# Patient Record
Sex: Male | Born: 1950 | Race: White | Hispanic: No | Marital: Married | Smoking: Former smoker
Health system: Southern US, Community
[De-identification: ages and names within clinical notes are randomized; demographics above are authoritative.]

## PROBLEM LIST (undated history)

## (undated) DIAGNOSIS — K219 Gastro-esophageal reflux disease without esophagitis: Secondary | ICD-10-CM

## (undated) DIAGNOSIS — E119 Type 2 diabetes mellitus without complications: Secondary | ICD-10-CM

## (undated) DIAGNOSIS — M5430 Sciatica, unspecified side: Secondary | ICD-10-CM

## (undated) DIAGNOSIS — I2510 Atherosclerotic heart disease of native coronary artery without angina pectoris: Secondary | ICD-10-CM

## (undated) DIAGNOSIS — I10 Essential (primary) hypertension: Secondary | ICD-10-CM

## (undated) DIAGNOSIS — F419 Anxiety disorder, unspecified: Secondary | ICD-10-CM

## (undated) HISTORY — PX: CARDIAC CATHETERIZATION: SHX172

## (undated) HISTORY — DX: Atherosclerotic heart disease of native coronary artery without angina pectoris: I25.10

## (undated) HISTORY — PX: OTHER SURGICAL HISTORY: SHX169

## (undated) HISTORY — DX: Type 2 diabetes mellitus without complications: E11.9

## (undated) HISTORY — DX: Sciatica, unspecified side: M54.30

## (undated) HISTORY — DX: Essential (primary) hypertension: I10

## (undated) HISTORY — DX: Anxiety disorder, unspecified: F41.9

---

## 2010-05-17 DIAGNOSIS — I219 Acute myocardial infarction, unspecified: Secondary | ICD-10-CM

## 2010-05-17 HISTORY — DX: Acute myocardial infarction, unspecified: I21.9

## 2010-05-17 HISTORY — PX: CORONARY ANGIOPLASTY WITH STENT PLACEMENT: SHX49

## 2010-05-17 HISTORY — PX: STENT PLACEMENT VASCULAR (ARMC HX): HXRAD1737

## 2014-08-22 DIAGNOSIS — E785 Hyperlipidemia, unspecified: Secondary | ICD-10-CM | POA: Insufficient documentation

## 2014-08-22 DIAGNOSIS — I10 Essential (primary) hypertension: Secondary | ICD-10-CM | POA: Insufficient documentation

## 2014-09-11 DIAGNOSIS — E1142 Type 2 diabetes mellitus with diabetic polyneuropathy: Secondary | ICD-10-CM | POA: Insufficient documentation

## 2019-08-13 ENCOUNTER — Ambulatory Visit: Payer: Self-pay | Attending: Internal Medicine

## 2019-08-13 ENCOUNTER — Other Ambulatory Visit: Payer: Self-pay

## 2019-08-13 DIAGNOSIS — Z23 Encounter for immunization: Secondary | ICD-10-CM

## 2019-08-13 NOTE — Progress Notes (Signed)
   Covid-19 Vaccination Clinic  Name:  Ronald Roth    MRN: [id number] DOB: 1951/04/26  08/13/2019  [name] was observed post Covid-19 immunization for 15 minutes without incident. He was provided with Vaccine Information Sheet and instruction to access the V-Safe system.   [name] was instructed to call 911 with any severe reactions post vaccine: Marland Kitchen Difficulty breathing  . Swelling of face and throat  . A fast heartbeat  . A bad rash all over body  . Dizziness and weakness   Immunizations Administered    Name Date Dose VIS Date Route   Pfizer COVID-19 Vaccine 08/13/2019 11:42 AM 0.3 mL 04/27/2019 Intramuscular   Manufacturer: [employer], [employer]   Lot: [id number]   NDC: [address]-0

## 2019-09-05 ENCOUNTER — Ambulatory Visit: Payer: Self-pay | Attending: Internal Medicine

## 2019-09-05 DIAGNOSIS — Z23 Encounter for immunization: Secondary | ICD-10-CM

## 2019-09-05 NOTE — Progress Notes (Signed)
   Covid-19 Vaccination Clinic  Name:  [name]    MRN: [id number] DOB: 02/22/1951  09/05/2019  [name] was observed post Covid-19 immunization for 15 minutes without incident. He was provided with Vaccine Information Sheet and instruction to access the V-Safe system.   [name] was instructed to call 911 with any severe reactions post vaccine: Marland Kitchen Difficulty breathing  . Swelling of face and throat  . A fast heartbeat  . A bad rash all over body  . Dizziness and weakness   Immunizations Administered    Name Date Dose VIS Date Route   Pfizer COVID-19 Vaccine 09/05/2019  3:22 PM 0.3 mL 07/11/2018 Intramuscular   Manufacturer: [employer], [employer]   Lot: [id number]   NDC: [address]-1

## 2020-01-29 LAB — HEMOGLOBIN A1C: Hemoglobin A1C: 7.3

## 2020-08-22 ENCOUNTER — Ambulatory Visit (INDEPENDENT_AMBULATORY_CARE_PROVIDER_SITE_OTHER): Payer: Managed Care, Other (non HMO) | Admitting: Family Medicine

## 2020-08-22 ENCOUNTER — Other Ambulatory Visit: Payer: Self-pay

## 2020-08-22 ENCOUNTER — Encounter: Payer: Self-pay | Admitting: Family Medicine

## 2020-08-22 VITALS — BP 155/89 | HR 95 | Ht 70.5 in | Wt 206.0 lb

## 2020-08-22 DIAGNOSIS — R0600 Dyspnea, unspecified: Secondary | ICD-10-CM

## 2020-08-22 DIAGNOSIS — I2510 Atherosclerotic heart disease of native coronary artery without angina pectoris: Secondary | ICD-10-CM | POA: Diagnosis not present

## 2020-08-22 DIAGNOSIS — E1169 Type 2 diabetes mellitus with other specified complication: Secondary | ICD-10-CM

## 2020-08-22 DIAGNOSIS — E785 Hyperlipidemia, unspecified: Secondary | ICD-10-CM

## 2020-08-22 DIAGNOSIS — E1140 Type 2 diabetes mellitus with diabetic neuropathy, unspecified: Secondary | ICD-10-CM | POA: Diagnosis not present

## 2020-08-22 DIAGNOSIS — R0609 Other forms of dyspnea: Secondary | ICD-10-CM

## 2020-08-22 DIAGNOSIS — Z7689 Persons encountering health services in other specified circumstances: Secondary | ICD-10-CM | POA: Diagnosis not present

## 2020-08-22 MED ORDER — OZEMPIC (0.25 OR 0.5 MG/DOSE) 2 MG/1.5ML ~~LOC~~ SOPN: 0.2500 mg | PEN_INJECTOR | SUBCUTANEOUS | 0 refills | Status: DC

## 2020-08-22 MED ORDER — JARDIANCE 25 MG PO TABS: 25.0000 mg | ORAL_TABLET | Freq: Every day | ORAL | 1 refills | Status: DC

## 2020-08-22 NOTE — Patient Instructions (Addendum)
Thank you for coming to the office today.  [facility]) HeartCare at [facility] [address] [place], [place] [address] Main: [phone]   Stop Synjardy  Take new Jardiance 25mg  once daily, sent to [facility]  Stop Bydureon Start new sample Ozempic  0.25mg  weekly for 4 weeks then up to 0.5mg  weekly x 2 weeks, will need new order, let me know before run out we can order if it is covered.  Please schedule a Follow-up Appointment to: Return in about 3 months (around 11/21/2020) for 3 month follow-up DM A1c, cardiology updates, HTN.  If you have any other questions or concerns, please feel free to call the office or send a message through MyChart. You may also schedule an earlier appointment if necessary.  Additionally, you may be receiving a survey about your experience at our office within a few days to 1 week by e-mail or mail. We value your feedback.  01/22/2021, DO [facility], [facility]

## 2020-08-22 NOTE — Progress Notes (Signed)
Subjective:    Patient ID: Ronald Roth, male    DOB: 03-14-1951, 70 y.o.   MRN: [id number]  Ronald Roth is a 70 y.o. male presenting on 08/22/2020 for Establish Care, Diabetes  Previously moved from [place] to [place], he had health issues in [place], had history of heart attack. Back in [place] he had Endocrinology managing most of his health. Now moved to [place] about 1 year ago. Here for new PCP. His wife works at [employer].  HPI   CHRONIC DM, Type 2: Hyperlipidemia History of DM since 2013, previously managed by Endocrinology Last medical visit telemedicine with Endocrinology, 01/2020 Meds: Bydureon BCIse 2mg  weekly, Synjardy 12.5mg /1000mg  BID w/ meal He reports issues with side effect on medication due to fecal urgency and diarrhea. Currently on ACEi  Denies hypoglycemia, polyuria, visual changes, numbness or tingling.  CAD Prior MI in 2013, angioplasty, stent. He is on Metoprolol 25mg  BID, ASA 81mg  daily, Lisinopril 10mg  daily. He describes an exertional dyspnea problem.  Recent URI Cough congestion recently within past 1-2 weeks. Had negative COVID test within past week from [facility]   Depression screen [facility] 2/9 08/22/2020  Decreased Interest 0  Down, Depressed, Hopeless 0  PHQ - 2 Score 0    Past Medical History:  Diagnosis Date  . Anxiety   . Diabetes mellitus without complication (HCC)   . Heart attack (HCC) 2012  . Hypertension   . Sciatica    Past Surgical History:  Procedure Laterality Date  . Ruptured disc     Sciatica   . STENT PLACEMENT VASCULAR ([facility] HX)  2012   Social History   Socioeconomic History  . Marital status: Married    Spouse name: Not on file  . Number of children: Not on file  . Years of education: Not on file  . Highest education level: Not on file  Occupational History  . Not on file  Tobacco Use  . Smoking status: Former Smoker    Years: 30.00    Types: Cigarettes  . Smokeless tobacco: Never Used  Substance and Sexual  Activity  . Alcohol use: Not Currently    Comment: Occasionally   . Drug use: Not on file  . Sexual activity: Not on file  Other Topics Concern  . Not on file  Social History Narrative  . Not on file   Social Determinants of Health   Financial Resource Strain: Not on file  Food Insecurity: Not on file  Transportation Needs: Not on file  Physical Activity: Not on file  Stress: Not on file  Social Connections: Not on file  Intimate Partner Violence: Not on file   History reviewed. No pertinent family history. Current Outpatient Medications on File Prior to Visit  Medication Sig  . aspirin EC 81 MG tablet Take 81 mg by mouth daily. Swallow whole.  4/9 atorvastatin (LIPITOR) 80 MG tablet Take 80 mg by mouth daily.  . cholecalciferol (VITAMIN D3) 25 MCG (1000 UNIT) tablet Take 1,000 Units by mouth daily.  . fenofibrate (TRICOR) 145 MG tablet Take 145 mg by mouth daily.  10/22/2020 lisinopril (ZESTRIL) 20 MG tablet Take 20 mg by mouth daily.  . metoprolol tartrate (LOPRESSOR) 25 MG tablet Take 25 mg by mouth daily.  2013 omeprazole (PRILOSEC) 20 MG capsule Take 20 mg by mouth daily.  . Vitamin D, Ergocalciferol, (DRISDOL) 1.25 MG (50000 UNIT) CAPS capsule Take by mouth.   No current facility-administered medications on file prior to visit.    Review of Systems  Per HPI unless specifically indicated above      Objective:    BP (!) 155/89   Pulse 95   Ht 5' 10.5" (1.791 m)   Wt 206 lb (93.4 kg)   SpO2 98%   BMI 29.14 kg/m   Wt Readings from Last 3 Encounters:  08/22/20 206 lb (93.4 kg)    Physical Exam Vitals and nursing note reviewed.  Constitutional:      General: He is not in acute distress.    Appearance: He is well-developed. He is not diaphoretic.     Comments: Well-appearing, comfortable, cooperative  HENT:     Head: Normocephalic and atraumatic.  Eyes:     General:        Right eye: No discharge.        Left eye: No discharge.     Conjunctiva/sclera: Conjunctivae  normal.  Neck:     Thyroid: No thyromegaly.  Cardiovascular:     Rate and Rhythm: Normal rate and regular rhythm.     Heart sounds: Normal heart sounds. No murmur heard.   Pulmonary:     Effort: Pulmonary effort is normal. No respiratory distress.     Breath sounds: Normal breath sounds. No wheezing or rales.  Musculoskeletal:        General: Normal range of motion.     Cervical back: Normal range of motion and neck supple.  Lymphadenopathy:     Cervical: No cervical adenopathy.  Skin:    General: Skin is warm and dry.     Findings: No erythema or rash.  Neurological:     Mental Status: He is alert and oriented to person, place, and time.  Psychiatric:        Behavior: Behavior normal.     Comments: Well groomed, good eye contact, normal speech and thoughts        Results for orders placed or performed in visit on 08/22/20  Hemoglobin A1c  Result Value Ref Range   Hemoglobin A1C 7.3       Assessment & Plan:   Problem List Items Addressed This Visit    Type 2 diabetes, controlled, with neuropathy (HCC) - Primary   Relevant Medications   atorvastatin (LIPITOR) 80 MG tablet   aspirin EC 81 MG tablet   lisinopril (ZESTRIL) 20 MG tablet   OZEMPIC, 0.25 OR 0.5 MG/DOSE, 2 MG/1.5ML SOPN   JARDIANCE 25 MG TABS tablet   Other Relevant Orders   Ambulatory referral to Cardiology   Hyperlipidemia associated with type 2 diabetes mellitus (HCC)   Relevant Medications   atorvastatin (LIPITOR) 80 MG tablet   aspirin EC 81 MG tablet   fenofibrate (TRICOR) 145 MG tablet   lisinopril (ZESTRIL) 20 MG tablet   OZEMPIC, 0.25 OR 0.5 MG/DOSE, 2 MG/1.5ML SOPN   JARDIANCE 25 MG TABS tablet   Other Relevant Orders   Ambulatory referral to Cardiology   Coronary artery disease involving native coronary artery of native heart without angina pectoris   Relevant Medications   atorvastatin (LIPITOR) 80 MG tablet   aspirin EC 81 MG tablet   metoprolol tartrate (LOPRESSOR) 25 MG tablet    fenofibrate (TRICOR) 145 MG tablet   lisinopril (ZESTRIL) 20 MG tablet   Other Relevant Orders   Ambulatory referral to Cardiology    Other Visit Diagnoses    Encounter to establish care with new doctor       Dyspnea on exertion       Relevant Orders   Ambulatory referral to  Cardiology      #Dyspnea CAD s/p stent HTN HLD Referral to Cardiology [facility] history of CAD, prior MI w/ stent in 2013 [place], dyspnea on exertion worsening now - pursue further work up.  Previously controlled DM, last A1c 7 range 01/2020 Complications - peripheral neuropathy,CAD, Hyperlipidemia  Plan:  1. Change therapy due to GI intolerance on Metformin. 2. Stop Bydureon BCise. Start Ozempic sample. 0.25mg  weekly x 4 weeks then up to 0.5mg  weekly x 2 weeks, for better efficacy in future at higher doses. 3. Stop Synjardy. Re order Jardiance 25mg   Encourage improved lifestyle - low carb, low sugar diet, reduce portion size, continue improving regular exercise Check CBG bring log to next visit for review Continue ASA, ACEi  Statin Advised to schedule DM ophtho exam, send record   Meds ordered this encounter  Medications  . OZEMPIC, 0.25 OR 0.5 MG/DOSE, 2 MG/1.5ML SOPN    Sig: Inject 0.25 mg into the skin once a week. For first 4 weeks. Then increase dose to 0.5mg  weekly    Dispense:  1 each    Refill:  0  . JARDIANCE 25 MG TABS tablet    Sig: Take 1 tablet (25 mg total) by mouth daily before breakfast.    Dispense:  90 tablet    Refill:  1    Discontinue Synjardy, switch to Ezel.     Follow up plan: Return in about 3 months (around 11/21/2020) for 3 month follow-up DM A1c, cardiology updates, HTN.  Will have [facility] orders drawn prior to next visit instead (Note wife works at 01/22/2021 and this is preferred)  [employer], DO [facility] 08/22/2020, 10:32 AM

## 2020-09-11 DIAGNOSIS — E1140 Type 2 diabetes mellitus with diabetic neuropathy, unspecified: Secondary | ICD-10-CM

## 2020-09-11 MED ORDER — OZEMPIC (0.25 OR 0.5 MG/DOSE) 2 MG/1.5ML ~~LOC~~ SOPN: 0.5000 mg | PEN_INJECTOR | SUBCUTANEOUS | 1 refills | Status: DC

## 2020-09-15 NOTE — Progress Notes (Signed)
Cardiology Office Note  Date:  09/16/2020   ID:  Ronald Roth, DOB 1951/02/17, MRN [id number]  PCP:  Smitty Cords, DO   Chief Complaint  Patient presents with  . New Patient (Initial Visit)    Ref by Dr. Althea Charon to establish care for CAD/s/p stent placement in 2012 in [place], [place]. Medications reviewed by the patient verbally. Patient c/o shortness of breath and chest pain.     HPI:  Ronald Roth is a 70 year old gentleman with past medical history of Diabetes type 2 Coronary disease prior MI 2012 angioplasty, stent Hyperlipidemia  former smoker Who presents by referral from Dr. Althea Charon for consultation of his coronary disease  Prior cardiac history discussed with him, records requested Typically feels fine but more recently reports that he Feels like I have run 1000 miles Goes to [employer] soccer games: trouble with walk to stadium Incline or anxiety worse  Does not check blood pressure at home, running high on today's visit  Prior anginal sx:  In 2012: left side chest pain, angina  Lab work reviewed from September 2021 Creatinine 1.23 Total cholesterol 164 LDL 102 A1c 7.3  [facility],  In [place], [place]  Father: aneurysm, CVA  Lived in [place] 6 years in [place], cutting grass would get some SOB  Some SOB during sex, coughing Weight down in 10 years  EKG personally reviewed by myself on todays visit NSR rate 97 LBBB   PMH:   has a past medical history of Anxiety, Coronary artery disease, Diabetes mellitus without complication (HCC), Heart attack (HCC) (2012), Hypertension, and Sciatica.  PSH:    Past Surgical History:  Procedure Laterality Date  . CARDIAC CATHETERIZATION    . CORONARY ANGIOPLASTY WITH STENT PLACEMENT  2012   x 1  . Ruptured disc     Sciatica   . STENT PLACEMENT VASCULAR ([facility] HX)  2012    Current Outpatient Medications  Medication Sig Dispense Refill  . aspirin EC 81 MG tablet Take 81 mg by mouth  daily. Swallow whole.    Marland Kitchen atorvastatin (LIPITOR) 80 MG tablet Take 80 mg by mouth daily.    . cholecalciferol (VITAMIN D3) 25 MCG (1000 UNIT) tablet Take 1,000 Units by mouth daily.    Marland Kitchen ezetimibe (ZETIA) 10 MG tablet Take 1 tablet (10 mg total) by mouth daily. 90 tablet 3  . fenofibrate (TRICOR) 145 MG tablet Take 145 mg by mouth daily.    Marland Kitchen JARDIANCE 25 MG TABS tablet Take 1 tablet (25 mg total) by mouth daily before breakfast. 90 tablet 1  . omeprazole (PRILOSEC) 20 MG capsule Take 20 mg by mouth daily.    Marland Kitchen OZEMPIC, 0.25 OR 0.5 MG/DOSE, 2 MG/1.5ML SOPN Inject 0.5 mg into the skin once a week. 4.5 mL 1  . tadalafil (CIALIS) 20 MG tablet Take 1 tablet (20 mg total) by mouth daily as needed for erectile dysfunction. 10 tablet 6  . Vitamin D, Ergocalciferol, (DRISDOL) 1.25 MG (50000 UNIT) CAPS capsule Take by mouth.     No current facility-administered medications for this visit.   Also taking metoprolol tartrate 25 daily, not twice a day Taking lisinopril 20 daily  Allergies:   Patient has no known allergies.   Social History:  The patient  reports that he quit smoking about 29 years ago. His smoking use included cigarettes. He has a 30.00 pack-year smoking history. He has never used smokeless tobacco. He reports previous alcohol use. He reports that he does not use drugs.  Family History:   family history includes Aortic aneurysm in his father; Arrhythmia in his mother; Hyperlipidemia in his sister; Hypertension in his sister; Pneumonia in his mother.    Review of Systems: Review of Systems  Constitutional: Negative.   HENT: Negative.   Respiratory: Negative.   Cardiovascular: Negative.   Gastrointestinal: Negative.   Musculoskeletal: Negative.   Neurological: Negative.   Psychiatric/Behavioral: Negative.   All other systems reviewed and are negative.    PHYSICAL EXAM: VS:  BP (!) 160/86 (BP Location: Right Arm, Patient Position: Sitting, Cuff Size: Normal)   Pulse 97   Ht  5' 10.5" (1.791 m)   Wt 207 lb 8 oz (94.1 kg)   SpO2 98%   BMI 29.35 kg/m  , BMI Body mass index is 29.35 kg/m. GEN: Well nourished, well developed, in no acute distress HEENT: normal Neck: no JVD, carotid bruits, or masses Cardiac: RRR; no murmurs, rubs, or gallops,no edema  Respiratory:  clear to auscultation bilaterally, normal work of breathing GI: soft, nontender, nondistended, + BS MS: no deformity or atrophy Skin: warm and dry, no rash Neuro:  Strength and sensation are intact Psych: euthymic mood, full affect   Recent Labs: No results found for requested labs within last 8760 hours.    Lipid Panel No results found for: CHOL, HDL, LDLCALC, TRIG    Wt Readings from Last 3 Encounters:  09/16/20 207 lb 8 oz (94.1 kg)  08/22/20 206 lb (93.4 kg)       ASSESSMENT AND PLAN:  Problem List Items Addressed This Visit      Cardiology Problems   Coronary artery disease involving native coronary artery of native heart without angina pectoris - Primary   Relevant Medications   ezetimibe (ZETIA) 10 MG tablet   tadalafil (CIALIS) 20 MG tablet   lisinopril (ZESTRIL) 20 MG tablet   metoprolol succinate (TOPROL-XL) 50 MG 24 hr tablet   Other Relevant Orders   EKG 12-Lead     Other   Type 2 diabetes, controlled, with neuropathy (HCC)   Relevant Medications   lisinopril (ZESTRIL) 20 MG tablet   Other Relevant Orders   EKG 12-Lead   Hyperlipidemia associated with type 2 diabetes mellitus (HCC)   Relevant Medications   ezetimibe (ZETIA) 10 MG tablet   lisinopril (ZESTRIL) 20 MG tablet    Other Visit Diagnoses    Chest pain, unspecified type       Relevant Orders   ECHOCARDIOGRAM COMPLETE   NM Myocar Multi W/Spect W/Wall Motion / EF   Short of breath on exertion       Relevant Orders   ECHOCARDIOGRAM COMPLETE     Essential hypertension Recommend monitoring blood pressure at home Lisinopril increased up to 40 daily, metoprolol tartrate 25 that he is taking now will  change to metoprolol succinate 50 daily  Shortness of breath Unable to exclude ischemia Echocardiogram ordered, Stress test also ordered Stressed importance of getting his blood pressure down  Hyperlipidemia Not at goal, will add Zetia to his Lipitor goal LDL less than 70  Coronary disease with stable angina We have ordered stress test as above Cholesterol medication modification as above Echo pending Records requested   Total encounter time more than 60 minutes  Greater than 50% was spent in counseling and coordination of care with the patient    Signed, Dossie Arbour, M.D., Ph.D. [facility] [place], [place] [phone]

## 2020-09-16 ENCOUNTER — Other Ambulatory Visit: Payer: Self-pay

## 2020-09-16 ENCOUNTER — Encounter: Payer: Self-pay | Admitting: Cardiovascular Disease

## 2020-09-16 ENCOUNTER — Ambulatory Visit (INDEPENDENT_AMBULATORY_CARE_PROVIDER_SITE_OTHER): Payer: Managed Care, Other (non HMO) | Admitting: Cardiovascular Disease

## 2020-09-16 ENCOUNTER — Telehealth: Payer: Self-pay

## 2020-09-16 VITALS — BP 160/86 | HR 97 | Ht 70.5 in | Wt 207.5 lb

## 2020-09-16 DIAGNOSIS — E1140 Type 2 diabetes mellitus with diabetic neuropathy, unspecified: Secondary | ICD-10-CM | POA: Diagnosis not present

## 2020-09-16 DIAGNOSIS — I2510 Atherosclerotic heart disease of native coronary artery without angina pectoris: Secondary | ICD-10-CM

## 2020-09-16 DIAGNOSIS — R0602 Shortness of breath: Secondary | ICD-10-CM

## 2020-09-16 DIAGNOSIS — E1169 Type 2 diabetes mellitus with other specified complication: Secondary | ICD-10-CM

## 2020-09-16 DIAGNOSIS — R079 Chest pain, unspecified: Secondary | ICD-10-CM

## 2020-09-16 DIAGNOSIS — E785 Hyperlipidemia, unspecified: Secondary | ICD-10-CM

## 2020-09-16 MED ORDER — LISINOPRIL 20 MG PO TABS: 40.0000 mg | ORAL_TABLET | Freq: Every day | ORAL | 3 refills | Status: DC

## 2020-09-16 MED ORDER — EZETIMIBE 10 MG PO TABS: 10.0000 mg | ORAL_TABLET | Freq: Every day | ORAL | 3 refills | Status: DC

## 2020-09-16 MED ORDER — TADALAFIL 20 MG PO TABS: 20.0000 mg | ORAL_TABLET | Freq: Every day | ORAL | 6 refills | Status: DC | PRN

## 2020-09-16 MED ORDER — METOPROLOL SUCCINATE ER 50 MG PO TB24: 50.0000 mg | ORAL_TABLET | Freq: Every day | ORAL | 3 refills | Status: DC

## 2020-09-16 NOTE — Patient Instructions (Addendum)
Medication Instructions:   Lisinopril was increased to 40 mg daily  START  Zetia 10 mg daily  metoprolol succinate 50 mg daily  Taldalifil /cialis 20 mg as needed  Can use GoodRx coupon  [employer] and [facility] cheaper   STOP metoprolol tartrate  If you need a refill on your cardiac medications before your next appointment, please call your pharmacy.    Lab work: No new labs needed  Testing/Procedures: ECHO  (shortness of breath, chest pain)  Your physician has requested that you have an echocardiogram. Echocardiography is a painless test that uses sound waves to create images of your heart. It provides your doctor with information about the size and shape of your heart and how well your heart's chambers and valves are working. This procedure takes approximately one hour. There are no restrictions for this procedure.  There is a possibility that an IV may need to be started during your test to inject an image enhancing agent. This is done to obtain more optimal pictures of your heart. Therefore we ask that you do at least drink some water prior to coming in to hydrate your veins.   [facility] MYOVIEW (Nuclear stress test)  Your caregiver has ordered a Stress Test with nuclear imaging. The purpose of this test is to evaluate the blood supply to your heart muscle. This procedure is referred to as a "Non-Invasive Stress Test." This is because other than having an IV started in your vein, nothing is inserted or "invades" your body. Cardiac stress tests are done to find areas of poor blood flow to the heart by determining the extent of coronary artery disease (CAD). Some patients exercise on a treadmill, which naturally increases the blood flow to your heart, while others who are  unable to walk on a treadmill due to physical limitations have a pharmacologic/chemical stress agent called Lexiscan . This medicine will mimic walking on a treadmill by temporarily increasing your coronary blood flow.    Please note: these test may take anywhere between 2-4 hours to complete  PLEASE REPORT TO [facility]  THE VOLUNTEERS AT THE FIRST DESK WILL DIRECT YOU WHERE TO GO  Instructions regarding medication:   _X__ : Hold diabetes medication morning of procedure  Jardiance (may take after stress test)  Ozempic (may take after stress test, if taken at night, only give  half dose)  _X___:  Hold betablocker(s) night before procedure and morning of procedure  Metoprolol (may take after stress test)   PLEASE NOTIFY THE OFFICE AT LEAST 24 HOURS IN ADVANCE IF YOU ARE UNABLE TO KEEP YOUR APPOINTMENT.  [phone] AND  PLEASE NOTIFY NUCLEAR MEDICINE AT [facility] AT LEAST 24 HOURS IN ADVANCE IF YOU ARE UNABLE TO KEEP YOUR APPOINTMENT. [phone]  How to prepare for your Myoview test:  1. Do not eat or drink after midnight 2. No caffeine for 24 hours prior to test 3. No smoking 24 hours prior to test. 4. Your medication may be taken with water.  If your doctor stopped a medication because of this test, do not take that medication. 5. Please wear a short sleeve shirt. 6. No perfume, cologne or lotion. 7. Wear comfortable walking shoes.   Follow-Up: At [facility], you and your health needs are our priority.  As part of our continuing mission to provide you with exceptional heart care, we have created designated Provider Care Teams.  These Care Teams include your primary Cardiologist (physician) and Advanced Practice Providers (APPs -  Physician  Assistants and Nurse Practitioners) who all work together to provide you with the care you need, when you need it.  . You will need a follow up appointment in 3 months  . Providers on your designated Care Team:   . Nicolasa Ducking, NP . Eula Listen, PA-C . Marisue Ivan, PA-C  Please monitor blood pressures and keep a log of your readings.  Call the clinic in 2 weeks with BP readings or upload them to Mychart  How to use a home  blood pressure monitor. . Be still. . Measure at the same time every day. It's important to take the readings at the same time each day, such as morning and evening. Take reading approximately 1 1/2 to 2 hours after BP medications.   AVOID these things for 30 minutes before checking your blood pressure:  Drinking caffeine.  Drinking alcohol.  Eating.  Smoking.  Exercising.   Five minutes before checking your blood pressure:  Pee.  Sit in a dining chair. Avoid sitting in a soft couch or armchair.  Be quiet. Do not talk.       Sit correctly. Sit with your back straight and supported (on a dining chair, rather than a sofa). Your feet should be flat on the floor and your legs should not be crossed. Your arm should be supported on a flat surface (such as a table) with the upper arm at heart level. Make sure the bottom of the cuff is placed directly above the bend of the elbow.    COVID-19 Vaccine Information can be found at: [employer]nl For questions related to vaccine distribution or appointments, please email vaccine@[email or web address] .[email or web address] or call 3347107405.

## 2020-09-16 NOTE — Telephone Encounter (Signed)
Medical records request form signed by Mr. Murray was faxed to [facility] - Dr. Madelyn Flavors, cardiologist in [place], [place]  Will wait for retun fax of pt's cardiac hx when he lived in [place].

## 2020-09-25 ENCOUNTER — Ambulatory Visit
Admission: RE | Admit: 2020-09-25 | Discharge: 2020-09-25 | Disposition: A | Discharge status: Home / Self Care | Payer: Managed Care, Other (non HMO) | Source: Ambulatory Visit | Attending: Cardiovascular Disease | Admitting: Cardiovascular Disease

## 2020-09-25 ENCOUNTER — Other Ambulatory Visit: Payer: Self-pay

## 2020-09-25 DIAGNOSIS — R079 Chest pain, unspecified: Secondary | ICD-10-CM

## 2020-09-25 LAB — NM MYOCAR MULTI W/SPECT W/WALL MOTION / EF
Estimated workload: 1 METS
Exercise duration (min): 0 min
Exercise duration (sec): 0 s
LV dias vol: 210 mL (ref 62–150)
LV sys vol: 157 mL
MPHR: 149 {beats}/min
Peak HR: 111 {beats}/min
Percent HR: 74 %
Rest HR: 88 {beats}/min
SDS: 2
SRS: 23
SSS: 20
TID: 1.08

## 2020-09-25 MED ORDER — TECHNETIUM TC 99M TETROFOSMIN IV KIT
31.2000 | PACK | Freq: Once | INTRAVENOUS | Status: AC | PRN
  Administered 2020-09-25: 31.2 via INTRAVENOUS

## 2020-09-25 MED ORDER — TECHNETIUM TC 99M TETROFOSMIN IV KIT
10.2400 | PACK | Freq: Once | INTRAVENOUS | Status: AC | PRN
  Administered 2020-09-25: 10.24 via INTRAVENOUS

## 2020-09-25 MED ORDER — REGADENOSON 0.4 MG/5ML IV SOLN
0.4000 mg | Freq: Once | INTRAVENOUS | Status: AC
  Administered 2020-09-25: 0.4 mg via INTRAVENOUS

## 2020-10-01 ENCOUNTER — Telehealth: Payer: Self-pay

## 2020-10-01 NOTE — Telephone Encounter (Signed)
Attempted to reach pt via phone to review his current lexiscan results, unable to reach pt via phone. LMTCB for results, did advised that there are some concerns and wanted to get his echo schedule for much sooner with f/u with Dr. [name]. Advised will have scheduling reach out to him to try and arrange this. Also can go over echo results more in detail when he calls back to have echo scheduled sooner.

## 2020-10-01 NOTE — Telephone Encounter (Signed)
Attempted to reach back out to pt, unable to reach pt via phone. LMTCB, echo and f/u app with Dr. [name] on hold at this time for pt confirmation

## 2020-10-08 ENCOUNTER — Other Ambulatory Visit: Payer: Self-pay

## 2020-10-08 ENCOUNTER — Ambulatory Visit (INDEPENDENT_AMBULATORY_CARE_PROVIDER_SITE_OTHER): Payer: Managed Care, Other (non HMO)

## 2020-10-08 DIAGNOSIS — R0602 Shortness of breath: Secondary | ICD-10-CM | POA: Diagnosis not present

## 2020-10-08 DIAGNOSIS — R079 Chest pain, unspecified: Secondary | ICD-10-CM

## 2020-10-08 LAB — ECHOCARDIOGRAM COMPLETE
AR max vel: 3.51 cm2
AV Area VTI: 3.74 cm2
AV Area mean vel: 3.46 cm2
AV Mean grad: 2 mmHg
AV Peak grad: 4.4 mmHg
Ao pk vel: 1.05 m/s
Calc EF: 40.4 %
S' Lateral: 4 cm
Single Plane A2C EF: 41.3 %
Single Plane A4C EF: 38.9 %

## 2020-10-08 MED ORDER — PERFLUTREN LIPID MICROSPHERE
1.0000 mL | INTRAVENOUS | Status: AC | PRN
  Administered 2020-10-08: 2 mL via INTRAVENOUS

## 2020-10-09 ENCOUNTER — Ambulatory Visit (INDEPENDENT_AMBULATORY_CARE_PROVIDER_SITE_OTHER): Payer: Managed Care, Other (non HMO) | Admitting: Medical

## 2020-10-09 ENCOUNTER — Telehealth: Payer: Self-pay | Admitting: *Deleted

## 2020-10-09 ENCOUNTER — Encounter: Payer: Self-pay | Admitting: Medical

## 2020-10-09 VITALS — BP 118/80 | HR 80 | Ht 70.5 in | Wt 202.8 lb

## 2020-10-09 DIAGNOSIS — E1140 Type 2 diabetes mellitus with diabetic neuropathy, unspecified: Secondary | ICD-10-CM

## 2020-10-09 DIAGNOSIS — E1169 Type 2 diabetes mellitus with other specified complication: Secondary | ICD-10-CM

## 2020-10-09 DIAGNOSIS — I2510 Atherosclerotic heart disease of native coronary artery without angina pectoris: Secondary | ICD-10-CM | POA: Diagnosis not present

## 2020-10-09 DIAGNOSIS — E785 Hyperlipidemia, unspecified: Secondary | ICD-10-CM

## 2020-10-09 DIAGNOSIS — R079 Chest pain, unspecified: Secondary | ICD-10-CM

## 2020-10-09 DIAGNOSIS — I429 Cardiomyopathy, unspecified: Secondary | ICD-10-CM

## 2020-10-09 DIAGNOSIS — R0602 Shortness of breath: Secondary | ICD-10-CM | POA: Diagnosis not present

## 2020-10-09 MED ORDER — NITROGLYCERIN 0.4 MG SL SUBL: 0.4000 mg | SUBLINGUAL_TABLET | SUBLINGUAL | 3 refills | Status: DC | PRN

## 2020-10-09 NOTE — Progress Notes (Signed)
Cardiology Office Note:    Date:  10/09/2020   ID:  Ronald Roth, DOB May 27, 1950, MRN [id number]  PCP:  Smitty Cords, DO  [facility] Cardiologist:  None  [facility] Electrophysiologist:  None   Referring MD: Saralyn Pilar * Chief Complaint: Follow-up echo  History of Present Illness:    Ronald Roth is a 70 y.o. 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 [name] 09/16/2020 for CAD.  He reported shortness of breath with exertion and an echo and stress test were ordered.  Blood pressure also high.  Lisinopril was increased to 40 mg daily and metoprolol was increased to 50 mg daily. That he was added for cholesterol management.  Echo showed LVEF 25 to 30%, global hypokinesis, mild LVH, mildly reduced RV function, mild MR.  Stress test showed no significant ischemia, large perfusion defect in the mid to distal anteroseptal wall, mid to distal inferior wall and apical region, hypokinesis of the region, EF 32% CT showed three-vessel CAD, moderate risk and overall.  Today, Echo and stress test were reviewed in detail. In chart review it is unsure if the low EF is new or old. His records have not been scanned in the chart. Prior note with no mention of Ischemic CM. The patient reports he has been feeling good since the last visit.  He feels better since increasing lisinopril and metoprolol. He was having diarrhea on a certain diabetic medication, and this was changed so diarrhea has resolved. He took BP for the last 2 weeks. BPS 110-120s/60-70s, HR 80s. He is still having dyspnea on exertion. Also has exertional angina. Symptoms seem to be mostly walking up hill and not on a flat service. He has to stop and symptoms improve. Denies LLE, orthopnea, pnd, fever, chills, nausea, or vomiting.   Past Medical History:  Diagnosis Date  . Anxiety   . Coronary artery  disease   . Diabetes mellitus without complication (HCC)   . Heart attack (HCC) 2012  . Hypertension   . Sciatica     Past Surgical History:  Procedure Laterality Date  . CARDIAC CATHETERIZATION    . CORONARY ANGIOPLASTY WITH STENT PLACEMENT  2012   x 1  . Ruptured disc     Sciatica   . STENT PLACEMENT VASCULAR ([facility] HX)  2012    Current Medications: No outpatient medications have been marked as taking for the 10/09/20 encounter (Appointment) with Fransico Michael, Braxtin Bamba H, PA-C.     Allergies:   Patient has no known allergies.   Social History   Socioeconomic History  . Marital status: Married    Spouse name: Not on file  . Number of children: Not on file  . Years of education: Not on file  . Highest education level: Not on file  Occupational History  . Not on file  Tobacco Use  . Smoking status: Former Smoker    Packs/day: 1.00    Years: 30.00    Pack years: 30.00    Types: Cigarettes    Quit date: 04/18/1991    Years since quitting: 29.4  . Smokeless tobacco: Never Used  Vaping Use  . Vaping Use: Never used  Substance and Sexual Activity  . Alcohol use: Not Currently    Comment: Occasionally   . Drug use: Never  . Sexual activity: Not on file  Other Topics Concern  . Not on  file  Social History Narrative  . Not on file   Social Determinants of Health   Financial Resource Strain: Not on file  Food Insecurity: Not on file  Transportation Needs: Not on file  Physical Activity: Not on file  Stress: Not on file  Social Connections: Not on file     Family History: The patient's family history includes Aortic aneurysm in his father; Arrhythmia in his mother; Hyperlipidemia in his sister; Hypertension in his sister; Pneumonia in his mother.  ROS:   Please see the history of present illness.     All other systems reviewed and are negative.  EKGs/Labs/Other Studies Reviewed:    The following studies were reviewed today:  Myoview stress test 09/25/20 Narrative  & Impression  Pharmacological myocardial perfusion imaging study with no significant ischemia Large perfusion defect in the mid to distal anteroseptal wall, mid to distal inferior wall and apical region Hypokinesis of the region detailed above,, EF estimated at 32% No EKG changes concerning for ischemia at peak stress or in recovery. CT attenuation correction images with moderate aortic atherosclerosis, three-vessel coronary calcification  Moderate risk scan given depressed ejection fraction, large defect   Signed, Dossie Arbour, MD, Ph.D [facility]    Echo 10/08/20 1. Left ventricular ejection fraction, by estimation, is 25 to 30%. The  left ventricle has severely decreased function. The left ventricle  demonstrates global hypokinesis. There is mild left ventricular  hypertrophy. Left ventricular diastolic parameters  are indeterminate.  2. Right ventricular systolic function is mildly reduced. The right  ventricular size is normal.  3. The mitral valve is normal in structure. Mild mitral valve  regurgitation.  4. The aortic valve was not well visualized. Aortic valve regurgitation  is not visualized.    EKG:  EKG is not ordered today.  The ekg ordered today demonstrates   Recent Labs: No results found for requested labs within last 8760 hours.  Recent Lipid Panel No results found for: CHOL, TRIG, HDL, CHOLHDL, VLDL, LDLCALC, LDLDIRECT   Physical Exam:    VS:  There were no vitals taken for this visit.    Wt Readings from Last 3 Encounters:  09/16/20 207 lb 8 oz (94.1 kg)  08/22/20 206 lb (93.4 kg)     GEN:  Well nourished, well developed in no acute distress HEENT: Normal NECK: No JVD; No carotid bruits LYMPHATICS: No lymphadenopathy CARDIAC: RRR, no murmurs, rubs, gallops RESPIRATORY:  Clear to auscultation without rales, wheezing or rhonchi  ABDOMEN: Soft, non-tender, non-distended MUSCULOSKELETAL:  No edema; No deformity  SKIN: Warm and dry NEUROLOGIC:   Alert and oriented x 3 PSYCHIATRIC:  Normal affect   ASSESSMENT:    1. Coronary artery disease involving native coronary artery of native heart without angina pectoris   2. Type 2 diabetes, controlled, with neuropathy (HCC)   3. Chest pain, unspecified type   4. Short of breath on exertion   5. Hyperlipidemia associated with type 2 diabetes mellitus (HCC)    PLAN:    In order of problems listed above:  Unstable Angina CAD s/p prior MI in 2012 with PCI/DES Prior records were brought at the last visit, however have not been scanned in. Stress test and echo showed low EF 25-32%, unsure if this is new or old. Stress test show no new ischemia and CT attenuation showed 3V CAD. Either way, he has been having unstable angina for some time now. I think it is reasonable to set him up for [facility], patient is  agreeable to plan. Continue Aspirin, statin, BB. I will send in SL Nitro. Risks and benefits of cardiac catheterization have been discussed with the patient.  These include bleeding, infection, kidney damage, stroke, heart attack, death.  The patient understands these risks and is willing to proceed.  Cardiomyopathy, new? Unsure if this is new as above.Last note does not mention ICM. Given symptoms plan for ischemic work-up as above. No LLE, orthopnea, pnd. Euvolemic on exam. Continue lisinopril and metoprolol. At follow-up can consider spiro. He is on [place]. He will eventually   HTN BPS better controlled at home. BP today 118/80. Continue lisinopril and metoprolol  HLD LDL 102 in 01/2020. Re-check fasting lipid panel and LFTs. Continue Atorvastatin and Zetia   Disposition: Follow up post-cath with APP/MD  Shared Decision Making/Informed Consent   Shared Decision Making/Informed Consent The risks [stroke (1 in 1000), death (1 in 1000), kidney failure [usually temporary] (1 in 500), bleeding (1 in 200), allergic reaction [possibly serious] (1 in 200)], benefits (diagnostic support and  management of coronary artery disease) and alternatives of a cardiac catheterization were discussed in detail with Mr. Syme and he is willing to proceed.        Signed, Tenise Stetler David Stall, PA-C  10/09/2020 7:45 AM    [facility]

## 2020-10-09 NOTE — Patient Instructions (Addendum)
Medication Instructions:  Your physician has recommended you make the following change in your medication:   START Nitroglycerin 0.4mg  under the tongue AS NEEDED for chest pain (see instructions below)  For as needed Nitroglycerin, if you develop chest pain: . Sit and rest 5 minutes. If chest pain does not resolve place 1 nitroglycerin under your tongue and wait 5 minutes. . If chest pain does not resolve, place a 2nd nitroglycerin under your tongue and wait 5 more minutes. . If chest pain does not resolve, place a 3rd nitroglycerin under your tongue and seek emergency services.    *If you need a refill on your cardiac medications before your next appointment, please call your pharmacy*   Lab Work: Your physician recommends that you have lab work TODAY: CMET, CBC, Lipid panel   If you have labs (blood work) drawn today and your tests are completely normal, you will receive your results only by: Marland Kitchen MyChart Message (if you have MyChart) OR . A paper copy in the mail If you have any lab test that is abnormal or we need to change your treatment, we will call you to review the results.   Testing/Procedures:   You are scheduled for a Cardiac Catheterization on Friday, June 3 with Dr. Lorine Bears.  1.Please arrive at the Medical Mall at [facility] at 6:30 AM (This time is one hour before your procedure to ensure your preparation). Free valet parking service is available.   Special note: Every effort is made to have your procedure done on time. Please understand that emergencies sometimes delay scheduled procedures.  2. Diet: Do not eat solid foods after midnight.  The patient may have clear liquids until 5am upon the day of the procedure.  3. Labs: We have completed labs needed today in office.   4. Medication instructions in preparation for your procedure:   Contrast Allergy: No  Do Not take Jardiance the morning of your procedure.   Pending your lab results, we may call regarding any  further medication instructions.  On the morning of your procedure, take your Aspirin and any morning medicines NOT listed above.  You may use sips of water.  5. Plan for one night stay--bring personal belongings. 6. Bring a current list of your medications and current insurance cards. 7. You MUST have a responsible person to drive you home. 8. Someone MUST be with you the first 24 hours after you arrive home or your discharge will be delayed. 9. Please wear clothes that are easy to get on and off and wear slip-on shoes.  Thank you for allowing [place] to care for you!   -- [facility]   Follow-Up: At [facility], you and your health needs are our priority.  As part of our continuing mission to provide you with exceptional heart care, we have created designated Provider Care Teams.  These Care Teams include your primary Cardiologist (physician) and Advanced Practice Providers (APPs -  Physician Assistants and Nurse Practitioners) who all work together to provide you with the care you need, when you need it.  We recommend signing up for the patient portal called "MyChart".  Sign up information is provided on this After Visit Summary.  MyChart is used to connect with patients for Virtual Visits (Telemedicine).  Patients are able to view lab/test results, encounter notes, upcoming appointments, etc.  Non-urgent messages can be sent to your provider as well.   To learn more about what you can do with MyChart,  go to [email or web address].    Your next appointment:    Follow up after cath procedure  The format for your next appointment:   In Person  Provider:   You may see Dr. [name] or one of the following Advanced Practice Providers on your designated Care Team:    Nicolasa Ducking, NP  Eula Listen, PA-C  Marisue Ivan, PA-C  [place], [place]  Gillian Shields, NP

## 2020-10-09 NOTE — H&P (View-Only) (Signed)
Cardiology Office Note:    Date:  10/09/2020   ID:  Ronald Roth, DOB May 27, 1950, MRN [id number]  PCP:  Ronald Cords, DO  [facility] Cardiologist:  None  [facility] Electrophysiologist:  None   Referring MD: Ronald Roth * Chief Complaint: Follow-up echo  History of Present Illness:    Ronald Roth is a 70 y.o. 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 Mariah Roth 09/16/2020 for CAD.  He reported shortness of breath with exertion and an echo and stress test were ordered.  Blood pressure also high.  Lisinopril was increased to 40 mg daily and metoprolol was increased to 50 mg daily. That he was added for cholesterol management.  Echo showed LVEF 25 to 30%, global hypokinesis, mild LVH, mildly reduced RV function, mild MR.  Stress test showed no significant ischemia, large perfusion defect in the mid to distal anteroseptal wall, mid to distal inferior wall and apical region, hypokinesis of the region, EF 32% CT showed three-vessel CAD, moderate risk and overall.  Today, Echo and stress test were reviewed in detail. In chart review it is unsure if the low EF is new or old. His records have not been scanned in the chart. Prior note with no mention of Ischemic CM. The patient reports he has been feeling good since the last visit.  He feels better since increasing lisinopril and metoprolol. He was having diarrhea on a certain diabetic medication, and this was changed so diarrhea has resolved. He took BP for the last 2 weeks. BPS 110-120s/60-70s, HR 80s. He is still having dyspnea on exertion. Also has exertional angina. Symptoms seem to be mostly walking up hill and not on a flat service. He has to stop and symptoms improve. Denies LLE, orthopnea, pnd, fever, chills, nausea, or vomiting.   Past Medical History:  Diagnosis Date  . Anxiety   . Coronary artery  disease   . Diabetes mellitus without complication (HCC)   . Heart attack (HCC) 2012  . Hypertension   . Sciatica     Past Surgical History:  Procedure Laterality Date  . CARDIAC CATHETERIZATION    . CORONARY ANGIOPLASTY WITH STENT PLACEMENT  2012   x 1  . Ruptured disc     Sciatica   . STENT PLACEMENT VASCULAR ([facility] HX)  2012    Current Medications: No outpatient medications have been marked as taking for the 10/09/20 encounter (Appointment) with Ronald Roth, Ronald Pannone H, PA-C.     Allergies:   Patient has no known allergies.   Social History   Socioeconomic History  . Marital status: Married    Spouse name: Not on file  . Number of children: Not on file  . Years of education: Not on file  . Highest education level: Not on file  Occupational History  . Not on file  Tobacco Use  . Smoking status: Former Smoker    Packs/day: 1.00    Years: 30.00    Pack years: 30.00    Types: Cigarettes    Quit date: 04/18/1991    Years since quitting: 29.4  . Smokeless tobacco: Never Used  Vaping Use  . Vaping Use: Never used  Substance and Sexual Activity  . Alcohol use: Not Currently    Comment: Occasionally   . Drug use: Never  . Sexual activity: Not on file  Other Topics Concern  . Not on  file  Social History Narrative  . Not on file   Social Determinants of Health   Financial Resource Strain: Not on file  Food Insecurity: Not on file  Transportation Needs: Not on file  Physical Activity: Not on file  Stress: Not on file  Social Connections: Not on file     Family History: The patient's family history includes Aortic aneurysm in his father; Arrhythmia in his mother; Hyperlipidemia in his sister; Hypertension in his sister; Pneumonia in his mother.  ROS:   Please see the history of present illness.     All other systems reviewed and are negative.  EKGs/Labs/Other Studies Reviewed:    The following studies were reviewed today:  Myoview stress test 09/25/20 Narrative  & Impression  Pharmacological myocardial perfusion imaging study with no significant ischemia Large perfusion defect in the mid to distal anteroseptal wall, mid to distal inferior wall and apical region Hypokinesis of the region detailed above,, EF estimated at 32% No EKG changes concerning for ischemia at peak stress or in recovery. CT attenuation correction images with moderate aortic atherosclerosis, three-vessel coronary calcification  Moderate risk scan given depressed ejection fraction, large defect   Signed, Ronald Arbour, MD, Ph.D [facility]    Echo 10/08/20 1. Left ventricular ejection fraction, by estimation, is 25 to 30%. The  left ventricle has severely decreased function. The left ventricle  demonstrates global hypokinesis. There is mild left ventricular  hypertrophy. Left ventricular diastolic parameters  are indeterminate.  2. Right ventricular systolic function is mildly reduced. The right  ventricular size is normal.  3. The mitral valve is normal in structure. Mild mitral valve  regurgitation.  4. The aortic valve was not well visualized. Aortic valve regurgitation  is not visualized.    EKG:  EKG is not ordered today.  The ekg ordered today demonstrates   Recent Labs: No results found for requested labs within last 8760 hours.  Recent Lipid Panel No results found for: CHOL, TRIG, HDL, CHOLHDL, VLDL, LDLCALC, LDLDIRECT   Physical Exam:    VS:  There were no vitals taken for this visit.    Wt Readings from Last 3 Encounters:  09/16/20 207 lb 8 oz (94.1 kg)  08/22/20 206 lb (93.4 kg)     GEN:  Well nourished, well developed in no acute distress HEENT: Normal NECK: No JVD; No carotid bruits LYMPHATICS: No lymphadenopathy CARDIAC: RRR, no murmurs, rubs, gallops RESPIRATORY:  Clear to auscultation without rales, wheezing or rhonchi  ABDOMEN: Soft, non-tender, non-distended MUSCULOSKELETAL:  No edema; No deformity  SKIN: Warm and dry NEUROLOGIC:   Alert and oriented x 3 PSYCHIATRIC:  Normal affect   ASSESSMENT:    1. Coronary artery disease involving native coronary artery of native heart without angina pectoris   2. Type 2 diabetes, controlled, with neuropathy (HCC)   3. Chest pain, unspecified type   4. Short of breath on exertion   5. Hyperlipidemia associated with type 2 diabetes mellitus (HCC)    PLAN:    In order of problems listed above:  Unstable Angina CAD s/p prior MI in 2012 with PCI/DES Prior records were brought at the last visit, however have not been scanned in. Stress test and echo showed low EF 25-32%, unsure if this is new or old. Stress test show no new ischemia and CT attenuation showed 3V CAD. Either way, he has been having unstable angina for some time now. I think it is reasonable to set him up for [facility], patient is  agreeable to plan. Continue Aspirin, statin, BB. I will send in SL Nitro. Risks and benefits of cardiac catheterization have been discussed with the patient.  These include bleeding, infection, kidney damage, stroke, heart attack, death.  The patient understands these risks and is willing to proceed.  Cardiomyopathy, new? Unsure if this is new as above.Last note does not mention ICM. Given symptoms plan for ischemic work-up as above. No LLE, orthopnea, pnd. Euvolemic on exam. Continue lisinopril and metoprolol. At follow-up can consider spiro. He is on [place]. He will eventually   HTN BPS better controlled at home. BP today 118/80. Continue lisinopril and metoprolol  HLD LDL 102 in 01/2020. Re-check fasting lipid panel and LFTs. Continue Atorvastatin and Zetia   Disposition: Follow up post-cath with APP/MD  Shared Decision Making/Informed Consent   Shared Decision Making/Informed Consent The risks [stroke (1 in 1000), death (1 in 1000), kidney failure [usually temporary] (1 in 500), bleeding (1 in 200), allergic reaction [possibly serious] (1 in 200)], benefits (diagnostic support and  management of coronary artery disease) and alternatives of a cardiac catheterization were discussed in detail with Mr. Syme and he is willing to proceed.        Signed, Orlando Devereux David Stall, PA-C  10/09/2020 7:45 AM    [facility]

## 2020-10-09 NOTE — Telephone Encounter (Signed)
Spoke to pt. Notified of Cadence recc to r/s cath procedure to [place]. Called and cancelled cath at [facility] that was scheduled for 10/17/20 with Dr. Kirke Corin. R/S at [facility] 10/20/20 with Dr. Okey Dupre. Reviewed updated instructions with pt over phone and also posted to MyChart per pt request. Pt to call back with any questions.   You are scheduled for a Cardiac Catheterization on Monday, June 6 with Dr. Cristal [place].  1. Please arrive at the [facility] (Main Entrance A) at [facility]: [address] [place], [place] [address] at 5:30 AM (This time is two hours before your procedure to ensure your preparation). Free valet parking service is available.   Special note: Every effort is made to have your procedure done on time. Please understand that emergencies sometimes delay scheduled procedures.  2. Diet: Do not eat solid foods after midnight.  The patient may have clear liquids until 5am upon the day of the procedure.  3. Labs: Labs were completed today at office visit.   4. Medication instructions in preparation for your procedure:   Contrast Allergy: No  DO NOT take Diabetes medication: Jardiance the morning of the test  On the morning of your procedure, take your Aspirin 81 and any morning medicines NOT listed above.  You may use sips of water.  5. Plan for one night stay--bring personal belongings. 6. Bring a current list of your medications and current insurance cards. 7. You MUST have a responsible person to drive you home. 8. Someone MUST be with you the first 24 hours after you arrive home or your discharge will be delayed. 9. Please wear clothes that are easy to get on and off and wear slip-on shoes.  Thank you for allowing [place] to care for you!   -- [place] [facility]

## 2020-10-10 LAB — CBC
Hematocrit: 48.9 % (ref 37.5–51.0)
Hemoglobin: 16.7 g/dL (ref 13.0–17.7)
MCH: 31.1 pg (ref 26.6–33.0)
MCHC: 34.2 g/dL (ref 31.5–35.7)
MCV: 91 fL (ref 79–97)
Platelets: 192 10*3/uL (ref 150–450)
RBC: 5.37 x10E6/uL (ref 4.14–5.80)
RDW: 13.7 % (ref 11.6–15.4)
WBC: 5 10*3/uL (ref 3.4–10.8)

## 2020-10-10 LAB — COMPREHENSIVE METABOLIC PANEL
ALT: 17 IU/L (ref 0–44)
AST: 20 IU/L (ref 0–40)
Albumin/Globulin Ratio: 1.7 (ref 1.2–2.2)
Albumin: 4.4 g/dL (ref 3.7–4.7)
Alkaline Phosphatase: 56 IU/L (ref 44–121)
BUN/Creatinine Ratio: 15 (ref 10–24)
BUN: 21 mg/dL (ref 8–27)
Bilirubin Total: 0.3 mg/dL (ref 0.0–1.2)
CO2: 23 mmol/L (ref 20–29)
Calcium: 9.5 mg/dL (ref 8.6–10.2)
Chloride: 99 mmol/L (ref 96–106)
Creatinine, Ser: 1.44 mg/dL — ABNORMAL HIGH (ref 0.76–1.27)
Globulin, Total: 2.6 g/dL (ref 1.5–4.5)
Glucose: 116 mg/dL — ABNORMAL HIGH (ref 65–99)
Potassium: 4.5 mmol/L (ref 3.5–5.2)
Sodium: 138 mmol/L (ref 134–144)
Total Protein: 7 g/dL (ref 6.0–8.5)
eGFR: 52 mL/min/{1.73_m2} — ABNORMAL LOW (ref >59)

## 2020-10-10 LAB — LIPID PANEL
Chol/HDL Ratio: 5 ratio (ref 0.0–5.0)
Cholesterol, Total: 120 mg/dL (ref 100–199)
HDL: 24 mg/dL — ABNORMAL LOW (ref >39)
LDL Chol Calc (NIH): 65 mg/dL (ref 0–99)
Triglycerides: 184 mg/dL — ABNORMAL HIGH (ref 0–149)
VLDL Cholesterol Cal: 31 mg/dL (ref 5–40)

## 2020-10-14 ENCOUNTER — Telehealth: Payer: Self-pay | Admitting: *Deleted

## 2020-10-14 DIAGNOSIS — I2510 Atherosclerotic heart disease of native coronary artery without angina pectoris: Secondary | ICD-10-CM

## 2020-10-14 NOTE — Telephone Encounter (Signed)
Spoke to pt. Notified of lab results and provider's recc.  Pt is scheduled for cardiac cath in [place] 10/20/20.  Can pt have repeat BMET at follow up visit 6/15?

## 2020-10-14 NOTE — Telephone Encounter (Signed)
Spoke to pt. He is going to have repeat BMET at medical mall at [facility] few days after cath procedure.  Lab orders placed. No further questions at this time.

## 2020-10-14 NOTE — Telephone Encounter (Signed)
-----   Message from [place] David Stall, PA-C sent at 10/14/2020 12:41 PM EDT ----- CBC normal. CMET showed elevated kidney function, but unsure of baseline. Check BMET in 1 week.

## 2020-10-16 ENCOUNTER — Other Ambulatory Visit: Payer: Managed Care, Other (non HMO)

## 2020-10-16 ENCOUNTER — Telehealth: Payer: Self-pay

## 2020-10-16 DIAGNOSIS — I2510 Atherosclerotic heart disease of native coronary artery without angina pectoris: Secondary | ICD-10-CM

## 2020-10-16 NOTE — Telephone Encounter (Addendum)
Spoke with the patient. Patient plans on coming to the [facility] medical mall tomorrow for his pre-procedure labs. Adv him that he will need to have an EKG repeated prior to his scheduled 10/20/20 cardiac cath. Patient is agreeable.  EKG appt scheduled at the medical mall on 10/17/20, patient is to arrive at 10:45am.  Patient aware of the appt, date, time and location. Adv him to check in at the registration desk upon arrival. Adv the patient that he is to get his labs drawn after.   Patient verbalized understanding and voiced appreciation for the call.

## 2020-10-16 NOTE — Telephone Encounter (Signed)
-----   Message from Bertram Millard, RN sent at 10/16/2020  9:22 AM EDT ----- Regarding: EKG pre cath [facility], This is Dewayne Hatch and I am covering for Katina Dung the procedure nurse today and this pt is having cath Monday 10/20/20... his EKG needs to be within 30 days but his ends 10/17/20... I saw that he was planning to have a repeat BMET for his GFR and he told me that he may have done today or tomorrow.... I was wondering if he is coming that way any chance he can get an EKG updated if not no worries they can do it the morning of the cath but I heard they prefer it be done ahead of time if possible with out inconveniencing the pt.   I did not mention to him to get him confused but just wanted to reach out to you to see if any possibility but no worries if not.

## 2020-10-16 NOTE — Telephone Encounter (Signed)
Pt contacted pre-catheterization scheduled at [facility] for: 10/20/20 Verified arrival time and place: [facility] Main Entrance A [facility]) at: 5:30 am   No solid food after midnight prior to cath, clear liquids until 5 AM day of procedure.  CONTRAST ALLERGY:NO  AM meds can be  taken pre-cath with sips of water including: ASA 81 mg Pt is holding his Lisinopril 10/19/20 and 10/20/20.   Confirmed patient has responsible adult to drive home post procedure and be with patient first 24 hours after arriving home: wife.   You are allowed ONE visitor in the waiting room during the time you are at the hospital for your procedure. Both you and your visitor must wear a mask once you enter the hospital.   Patient reports does not currently have any symptoms concerning for COVID-19 and no household members with COVID-19 like illness.   Pt EKG will not be within 30 days as of 10/17/20 I reached out to see if he can have repeated when he has his repeat BMET this week if not will have the morning of the cath 10/20/20.

## 2020-10-17 ENCOUNTER — Ambulatory Visit: Admit: 2020-10-17 | Payer: Managed Care, Other (non HMO) | Admitting: Cardiovascular Disease

## 2020-10-17 ENCOUNTER — Other Ambulatory Visit: Payer: Self-pay

## 2020-10-17 ENCOUNTER — Ambulatory Visit
Admission: RE | Admit: 2020-10-17 | Discharge: 2020-10-17 | Disposition: A | Discharge status: Home / Self Care | Payer: Managed Care, Other (non HMO) | Source: Ambulatory Visit | Attending: Family Medicine | Admitting: Family Medicine

## 2020-10-17 ENCOUNTER — Other Ambulatory Visit
Admission: RE | Admit: 2020-10-17 | Discharge: 2020-10-17 | Disposition: A | Discharge status: Home / Self Care | Payer: Managed Care, Other (non HMO) | Source: Ambulatory Visit | Attending: Medical | Admitting: Medical

## 2020-10-17 DIAGNOSIS — I2510 Atherosclerotic heart disease of native coronary artery without angina pectoris: Secondary | ICD-10-CM | POA: Insufficient documentation

## 2020-10-17 DIAGNOSIS — I447 Left bundle-branch block, unspecified: Secondary | ICD-10-CM | POA: Diagnosis not present

## 2020-10-17 LAB — BASIC METABOLIC PANEL
Anion gap: 10 (ref 5–15)
BUN: 23 mg/dL (ref 8–23)
CO2: 23 mmol/L (ref 22–32)
Calcium: 9.4 mg/dL (ref 8.9–10.3)
Chloride: 104 mmol/L (ref 98–111)
Creatinine, Ser: 1.24 mg/dL (ref 0.61–1.24)
GFR, Estimated: >60 mL/min (ref >60)
Glucose, Bld: 118 mg/dL — ABNORMAL HIGH (ref 70–99)
Potassium: 4.2 mmol/L (ref 3.5–5.1)
Sodium: 137 mmol/L (ref 135–145)

## 2020-10-17 SURGERY — LEFT HEART CATH AND CORONARY ANGIOGRAPHY
Anesthesia: Moderate Sedation

## 2020-10-20 ENCOUNTER — Encounter (HOSPITAL_COMMUNITY)
Admission: RE | Disposition: A | Discharge status: Home / Self Care | Payer: Self-pay | Source: Home / Self Care | Attending: Internal Medicine

## 2020-10-20 ENCOUNTER — Ambulatory Visit (HOSPITAL_COMMUNITY)
Admission: RE | Admit: 2020-10-20 | Discharge: 2020-10-20 | Disposition: A | Discharge status: Home / Self Care | Payer: Managed Care, Other (non HMO) | Attending: Internal Medicine | Admitting: Internal Medicine

## 2020-10-20 ENCOUNTER — Other Ambulatory Visit: Payer: Self-pay

## 2020-10-20 ENCOUNTER — Encounter (HOSPITAL_COMMUNITY): Payer: Self-pay | Admitting: Internal Medicine

## 2020-10-20 DIAGNOSIS — E785 Hyperlipidemia, unspecified: Secondary | ICD-10-CM | POA: Insufficient documentation

## 2020-10-20 DIAGNOSIS — E1140 Type 2 diabetes mellitus with diabetic neuropathy, unspecified: Secondary | ICD-10-CM | POA: Insufficient documentation

## 2020-10-20 DIAGNOSIS — I10 Essential (primary) hypertension: Secondary | ICD-10-CM | POA: Insufficient documentation

## 2020-10-20 DIAGNOSIS — R0602 Shortness of breath: Secondary | ICD-10-CM | POA: Diagnosis not present

## 2020-10-20 DIAGNOSIS — I429 Cardiomyopathy, unspecified: Secondary | ICD-10-CM | POA: Diagnosis not present

## 2020-10-20 DIAGNOSIS — I25118 Atherosclerotic heart disease of native coronary artery with other forms of angina pectoris: Secondary | ICD-10-CM | POA: Diagnosis present

## 2020-10-20 DIAGNOSIS — I208 Other forms of angina pectoris: Secondary | ICD-10-CM | POA: Diagnosis present

## 2020-10-20 DIAGNOSIS — Z87891 Personal history of nicotine dependence: Secondary | ICD-10-CM | POA: Insufficient documentation

## 2020-10-20 DIAGNOSIS — I2089 Other forms of angina pectoris: Secondary | ICD-10-CM | POA: Diagnosis present

## 2020-10-20 DIAGNOSIS — R079 Chest pain, unspecified: Secondary | ICD-10-CM

## 2020-10-20 HISTORY — PX: LEFT HEART CATH AND CORONARY ANGIOGRAPHY: CATH118249

## 2020-10-20 LAB — GLUCOSE, CAPILLARY
Glucose-Capillary: 147 mg/dL — ABNORMAL HIGH (ref 70–99)
Glucose-Capillary: 130 mg/dL — ABNORMAL HIGH (ref 70–99)

## 2020-10-20 SURGERY — LEFT HEART CATH AND CORONARY ANGIOGRAPHY
Anesthesia: LOCAL

## 2020-10-20 MED ORDER — SODIUM CHLORIDE 0.9% FLUSH: 3.0000 mL | Freq: Two times a day (BID) | INTRAVENOUS | Status: DC

## 2020-10-20 MED ORDER — FENTANYL CITRATE (PF) 100 MCG/2ML IJ SOLN
INTRAMUSCULAR | Status: DC | PRN
  Administered 2020-10-20: 25 ug via INTRAVENOUS

## 2020-10-20 MED ORDER — SODIUM CHLORIDE 0.9 % IV SOLN: INTRAVENOUS | Status: DC

## 2020-10-20 MED ORDER — HEPARIN (PORCINE) IN NACL 1000-0.9 UT/500ML-% IV SOLN
INTRAVENOUS | Status: AC
  Filled 2020-10-20: qty 500

## 2020-10-20 MED ORDER — HYDRALAZINE HCL 20 MG/ML IJ SOLN: 10.0000 mg | INTRAMUSCULAR | Status: DC | PRN

## 2020-10-20 MED ORDER — MIDAZOLAM HCL 2 MG/2ML IJ SOLN
INTRAMUSCULAR | Status: AC
  Filled 2020-10-20: qty 2

## 2020-10-20 MED ORDER — LIDOCAINE HCL (PF) 1 % IJ SOLN
INTRAMUSCULAR | Status: AC
  Filled 2020-10-20: qty 30

## 2020-10-20 MED ORDER — VERAPAMIL HCL 2.5 MG/ML IV SOLN
INTRAVENOUS | Status: AC
  Filled 2020-10-20: qty 2

## 2020-10-20 MED ORDER — IOHEXOL 350 MG/ML SOLN
INTRAVENOUS | Status: DC | PRN
  Administered 2020-10-20: 40 mL

## 2020-10-20 MED ORDER — LIDOCAINE HCL (PF) 1 % IJ SOLN
INTRAMUSCULAR | Status: DC | PRN
  Administered 2020-10-20: 2 mL

## 2020-10-20 MED ORDER — VERAPAMIL HCL 2.5 MG/ML IV SOLN
INTRAVENOUS | Status: DC | PRN
  Administered 2020-10-20: 10 mL via INTRA_ARTERIAL

## 2020-10-20 MED ORDER — ONDANSETRON HCL 4 MG/2ML IJ SOLN: 4.0000 mg | Freq: Four times a day (QID) | INTRAMUSCULAR | Status: DC | PRN

## 2020-10-20 MED ORDER — ACETAMINOPHEN 325 MG PO TABS: 650.0000 mg | ORAL_TABLET | ORAL | Status: DC | PRN

## 2020-10-20 MED ORDER — ASPIRIN 81 MG PO CHEW: 81.0000 mg | CHEWABLE_TABLET | ORAL | Status: DC

## 2020-10-20 MED ORDER — HEPARIN SODIUM (PORCINE) 1000 UNIT/ML IJ SOLN
INTRAMUSCULAR | Status: AC
  Filled 2020-10-20: qty 1

## 2020-10-20 MED ORDER — HEPARIN SODIUM (PORCINE) 1000 UNIT/ML IJ SOLN
INTRAMUSCULAR | Status: DC | PRN
  Administered 2020-10-20: 4500 [IU] via INTRAVENOUS

## 2020-10-20 MED ORDER — MIDAZOLAM HCL 2 MG/2ML IJ SOLN
INTRAMUSCULAR | Status: DC | PRN
  Administered 2020-10-20: 1 mg via INTRAVENOUS

## 2020-10-20 MED ORDER — HEPARIN (PORCINE) IN NACL 1000-0.9 UT/500ML-% IV SOLN
INTRAVENOUS | Status: DC | PRN
  Administered 2020-10-20 (×2): 500 mL

## 2020-10-20 MED ORDER — SODIUM CHLORIDE 0.9 % IV SOLN: 250.0000 mL | INTRAVENOUS | Status: DC | PRN

## 2020-10-20 MED ORDER — SODIUM CHLORIDE 0.9% FLUSH: 3.0000 mL | INTRAVENOUS | Status: DC | PRN

## 2020-10-20 MED ORDER — FENTANYL CITRATE (PF) 100 MCG/2ML IJ SOLN
INTRAMUSCULAR | Status: AC
  Filled 2020-10-20: qty 2

## 2020-10-20 MED ORDER — LABETALOL HCL 5 MG/ML IV SOLN: 10.0000 mg | INTRAVENOUS | Status: DC | PRN

## 2020-10-20 SURGICAL SUPPLY — 9 items
CATH 5FR JL3.5 JR4 ANG PIG MP (CATHETERS) ×2 IMPLANT
DEVICE RAD COMP TR BAND LRG (VASCULAR PRODUCTS) ×2 IMPLANT
GLIDESHEATH SLEND SS 6F .021 (SHEATH) ×2 IMPLANT
GUIDEWIRE INQWIRE 1.5J.035X260 (WIRE) ×1 IMPLANT
INQWIRE 1.5J .035X260CM (WIRE) ×2
KIT HEART LEFT (KITS) ×2 IMPLANT
PACK CARDIAC CATHETERIZATION (CUSTOM PROCEDURE TRAY) ×2 IMPLANT
TRANSDUCER W/STOPCOCK (MISCELLANEOUS) ×2 IMPLANT
TUBING CIL FLEX 10 FLL-RA (TUBING) ×2 IMPLANT

## 2020-10-20 NOTE — Discharge Instructions (Signed)
Radial Site Care  This sheet gives you information about how to care for yourself after your procedure. Your health care provider may also give you more specific instructions. If you have problems or questions, contact your health care provider. What can I expect after the procedure? After the procedure, it is common to have:  Bruising and tenderness at the catheter insertion area. Follow these instructions at home: Medicines  Take over-the-counter and prescription medicines only as told by your health care provider. Insertion site care  Follow instructions from your health care provider about how to take care of your insertion site. Make sure you: ? Wash your hands with soap and water before you change your bandage (dressing). If soap and water are not available, use hand sanitizer. ? Change your dressing as told by your health care provider. ? Leave stitches (sutures), skin glue, or adhesive strips in place. These skin closures may need to stay in place for 2 weeks or longer. If adhesive strip edges start to loosen and curl up, you may trim the loose edges. Do not remove adhesive strips completely unless your health care provider tells you to do that.  Check your insertion site every day for signs of infection. Check for: ? Redness, swelling, or pain. ? Fluid or blood. ? Pus or a bad smell. ? Warmth.  Do not take baths, swim, or use a hot tub until your health care provider approves.  You may shower 24-48 hours after the procedure, or as directed by your health care provider. ? Remove the dressing and gently wash the site with plain soap and water. ? Pat the area dry with a clean towel. ? Do not rub the site. That could cause bleeding.  Do not apply powder or lotion to the site. Activity  For 24 hours after the procedure, or as directed by your health care provider: ? Do not flex or bend the affected arm. ? Do not push or pull heavy objects with the affected arm. ? Do not drive  yourself home from the hospital or clinic. You may drive 24 hours after the procedure unless your health care provider tells you not to. ? Do not operate machinery or power tools.  Do not lift anything that is heavier than 10 lb (4.5 kg), or the limit that you are told, until your health care provider says that it is safe.  Ask your health care provider when it is okay to: ? Return to work or school. ? Resume usual physical activities or sports. ? Resume sexual activity.   General instructions  If the catheter site starts to bleed, raise your arm and put firm pressure on the site. If the bleeding does not stop, get help right away. This is a medical emergency.  If you went home on the same day as your procedure, a responsible adult should be with you for the first 24 hours after you arrive home.  Keep all follow-up visits as told by your health care provider. This is important. Contact a health care provider if:  You have a fever.  You have redness, swelling, or yellow drainage around your insertion site. Get help right away if:  You have unusual pain at the radial site.  The catheter insertion area swells very fast.  The insertion area is bleeding, and the bleeding does not stop when you hold steady pressure on the area.  Your arm or hand becomes pale, cool, tingly, or numb. These symptoms may represent a serious   problem that is an emergency. Do not wait to see if the symptoms will go away. Get medical help right away. Call your local emergency services (911 in the [place] Do not drive yourself to the hospital. Summary  After the procedure, it is common to have bruising and tenderness at the site.  Follow instructions from your health care provider about how to take care of your radial site wound. Check the wound every day for signs of infection.  Do not lift anything that is heavier than 10 lb (4.5 kg), or the limit that you are told, until your health care provider says that it  is safe. This information is not intended to replace advice given to you by your health care provider. Make sure you discuss any questions you have with your health care provider. Document Revised: 06/08/2017 Document Reviewed: 06/08/2017 Elsevier Patient Education  2021 Elsevier Inc.  

## 2020-10-20 NOTE — Interval H&P Note (Signed)
History and Physical Interval Note:  10/20/2020 7:12 AM  Ronald Roth  has presented today for surgery, with the diagnosis of stable angina and cardiomyopathy.  The various methods of treatment have been discussed with the patient and family. After consideration of risks, benefits and other options for treatment, the patient has consented to  Procedure(s): LEFT HEART CATH AND CORONARY ANGIOGRAPHY (N/A) as a surgical intervention.  The patient's history has been reviewed, patient examined, no change in status, stable for surgery.  I have reviewed the patient's chart and labs.  Questions were answered to the patient's satisfaction.    Cath Lab Visit (complete for each Cath Lab visit)  Clinical Evaluation Leading to the Procedure:   ACS: No.  Non-ACS:    Anginal Classification: CCS II  Anti-ischemic medical therapy: Minimal Therapy (1 class of medications)  Non-Invasive Test Results: Intermediate-risk stress test findings: cardiac mortality 1-3%/year  Prior CABG: No previous CABG  Ronald Roth

## 2020-10-24 ENCOUNTER — Other Ambulatory Visit: Payer: Self-pay

## 2020-10-27 ENCOUNTER — Institutional Professional Consult (permissible substitution) (INDEPENDENT_AMBULATORY_CARE_PROVIDER_SITE_OTHER): Payer: Managed Care, Other (non HMO) | Admitting: Cardiothoracic Surgery

## 2020-10-27 ENCOUNTER — Other Ambulatory Visit: Payer: Self-pay | Admitting: *Deleted

## 2020-10-27 ENCOUNTER — Other Ambulatory Visit: Payer: Self-pay

## 2020-10-27 VITALS — BP 120/55 | HR 69 | Resp 20 | Wt 204.0 lb

## 2020-10-27 DIAGNOSIS — I2510 Atherosclerotic heart disease of native coronary artery without angina pectoris: Secondary | ICD-10-CM

## 2020-10-27 DIAGNOSIS — I21A9 Other myocardial infarction type: Secondary | ICD-10-CM

## 2020-10-27 DIAGNOSIS — I509 Heart failure, unspecified: Secondary | ICD-10-CM

## 2020-10-28 NOTE — Progress Notes (Signed)
[address]       [name] [address]             [phone]     CARDIOTHORACIC SURGERY CONSULTATION REPORT  Referring Provider is [name], Tollie Pizza, MD Primary Cardiologist is None PCP is Althea Charon Netta Neat, DO  Chief Complaint  Patient presents with   Consult   Coronary Artery Disease    Initial surgical consult, cath 6/6, echo 5/25    HPI:  70 year old man is referred for consideration of CABG.  He is a 10-year history of coronary artery disease status post myocardial infarction 10 years ago at which point he underwent PCI.  He retired shortly thereafter.  His symptoms were relatively stable until this past spring when he noticed fatigue and shortness of breath with walking up inclines.  He also notes generalized fatigue.  Occasionally, in retrospect, he experienced mild chest pain and shortness of breath with other less strenuous activities.  He presented with the symptoms to his physician who ordered a coronary CT angiogram which demonstrated severe three-vessel disease.  This was confirmed by left heart catheterization.  In addition, he was found to have a globally reduced LV function with EF in the 25 to 30% range.  He denies orthopnea PND or significant peripheral edema.  Other coronary risk factors include diabetes and hypertension.  He also has a strong family history for coronary artery disease.  Past Medical History:  Diagnosis Date   Anxiety    Coronary artery disease    Diabetes mellitus without complication (HCC)    Heart attack (HCC) 2012   Hypertension    Sciatica     Past Surgical History:  Procedure Laterality Date   CARDIAC CATHETERIZATION     CORONARY ANGIOPLASTY WITH STENT PLACEMENT  2012   x 1   LEFT HEART CATH AND CORONARY ANGIOGRAPHY N/A 10/20/2020   Procedure: LEFT HEART CATH AND CORONARY ANGIOGRAPHY;  Surgeon: Yvonne Kendall, MD;  Location: [facility] INVASIVE CV LAB;  Service: Cardiovascular;  Laterality: N/A;   Ruptured  disc     Sciatica    STENT PLACEMENT VASCULAR ([facility] HX)  2012    Family History  Problem Relation Age of Onset   Pneumonia Mother    Arrhythmia Mother        pacemaker    Aortic aneurysm Father    Hyperlipidemia Sister    Hypertension Sister     Social History   Socioeconomic History   Marital status: Married    Spouse name: Not on file   Number of children: Not on file   Years of education: Not on file   Highest education level: Not on file  Occupational History   Not on file  Tobacco Use   Smoking status: Former    Packs/day: 1.00    Years: 30.00    Pack years: 30.00    Types: Cigarettes    Quit date: 04/18/1991    Years since quitting: 29.5   Smokeless tobacco: Never  Vaping Use   Vaping Use: Never used  Substance and Sexual Activity   Alcohol use: Not Currently    Comment: Occasionally    Drug use: Never   Sexual activity: Not on file  Other Topics Concern   Not on file  Social History Narrative   Not on file   Social Determinants of Health   Financial Resource Strain: Not on file  Food Insecurity: Not on file  Transportation Needs: Not on file  Physical Activity: Not on file  Stress: Not on file  Social Connections: Not on file  Intimate Partner Violence: Not on file    Current Outpatient Medications  Medication Sig Dispense Refill   acetaminophen (TYLENOL) 500 MG tablet Take 1,000 mg by mouth at bedtime as needed (neuropathy).     aspirin EC 81 MG tablet Take 81 mg by mouth in the morning. Swallow whole.     atorvastatin (LIPITOR) 80 MG tablet Take 80 mg by mouth in the morning.     cholecalciferol (VITAMIN D3) 25 MCG (1000 UNIT) tablet Take 1,000 Units by mouth daily.     diphenhydrAMINE (BENADRYL) 25 MG tablet Take 75 mg by mouth at bedtime.     ezetimibe (ZETIA) 10 MG tablet Take 1 tablet (10 mg total) by mouth daily. (Patient taking differently: Take 10 mg by mouth in the morning.) 90 tablet 3   fenofibrate (TRICOR) 145 MG tablet Take 145 mg  by mouth in the morning.     JARDIANCE 25 MG TABS tablet Take 1 tablet (25 mg total) by mouth daily before breakfast. 90 tablet 1   lisinopril (ZESTRIL) 20 MG tablet Take 2 tablets (40 mg total) by mouth daily. (Patient taking differently: Take 20 mg by mouth in the morning and at bedtime.) 90 tablet 3   metoprolol succinate (TOPROL-XL) 50 MG 24 hr tablet Take 1 tablet (50 mg total) by mouth daily. Take with or immediately following a meal. (Patient taking differently: Take 50 mg by mouth daily with supper. Take with or immediately following a meal.) 90 tablet 3   nitroGLYCERIN (NITROSTAT) 0.4 MG SL tablet Place 1 tablet (0.4 mg total) under the tongue every 5 (five) minutes as needed for chest pain. (Patient taking differently: Place 0.4 mg under the tongue every 5 (five) minutes x 3 doses as needed for chest pain.) 25 tablet 3   omeprazole (PRILOSEC) 20 MG capsule Take 20 mg by mouth in the morning.     OZEMPIC, 0.25 OR 0.5 MG/DOSE, 2 MG/1.5ML SOPN Inject 0.5 mg into the skin once a week. (Patient taking differently: Inject 0.5 mg into the skin every Monday.) 4.5 mL 1   tadalafil (CIALIS) 20 MG tablet Take 1 tablet (20 mg total) by mouth daily as needed for erectile dysfunction. 10 tablet 6   Vitamin D, Ergocalciferol, (DRISDOL) 1.25 MG (50000 UNIT) CAPS capsule Take 50,000 Units by mouth 2 (two) times a week. Sundays & Thursdays     No current facility-administered medications for this visit.    No Known Allergies    Review of Systems:   General:  Decreased energy, no weight change  Cardiac:  As per HPI  Respiratory:  No asthma or CPAP use  GI:   Endorses frequent heartburn and diarrhea with diabetes medications  GU:   Mild renal insufficiency  Vascular:  Notes pain in feet  Neuro:   Endorses peripheral neuropathy  Musculoskeletal: No arthritis or joint pain  Skin:   Negative  Psych:   Endorses anxiety  Eyes:   positive for floaters, does wears glasses  ENT:   Dentures and hearing  loss last dental visit 2 years ago  Hematologic:  Negative  Endocrine:  Positive for diabetes,     Physical Exam:   BP (!) 120/55 (BP Location: Right Arm, Patient Position: Sitting)   Pulse 69   Resp 20   Wt 92.5 kg   SpO2 94%   BMI 28.86 kg/m   General:    well-appearing  HEENT:  Unremarkable   Neck:   no JVD, no bruits, no adenopathy   Chest:   clear to auscultation, symmetrical breath sounds, no wheezes, no rhonchi   CV:   RRR, no detectable murmur   Abdomen:  soft, non-tender, no masses   Extremities:  warm, well-perfused, pulses intact and symmetrical, no LE edema  Rectal/GU  Deferred  Neuro:   Grossly non-focal and symmetrical throughout  Skin:   Clean and dry, no rashes, no breakdown   Diagnostic Tests:  I personally reviewed his available imaging studies including left heart catheterization from 10/30/2020 which demonstrates severe multivessel coronary artery disease.   Impression:  70 year old man with congestive heart failure diabetes consistent with ischemic cardiomyopathy and multivessel coronary artery disease.  Agree with recommendation for CABG is the best medical therapy for these factors   Plan:  We will complete his outpatient work-up this week.  Plan for CABG on Friday, 10/31/2020.   I spent in excess of 30 minutes during the conduct of this office consultation and >50% of this time involved direct face-to-face encounter with the patient for counseling and/or coordination of their care.          Level 3 Office Consult = 40 minutes         Level 4 Office Consult = 60 minutes         Level 5 Office Consult = 80 minutes  B.  Lorayne Marek, MD 10/28/2020 2:20 PM

## 2020-10-29 ENCOUNTER — Ambulatory Visit (HOSPITAL_BASED_OUTPATIENT_CLINIC_OR_DEPARTMENT_OTHER)
Admission: RE | Admit: 2020-10-29 | Discharge: 2020-10-29 | Disposition: A | Discharge status: Home / Self Care | Payer: Managed Care, Other (non HMO) | Source: Ambulatory Visit | Attending: Cardiothoracic Surgery | Admitting: Cardiothoracic Surgery

## 2020-10-29 ENCOUNTER — Encounter (HOSPITAL_COMMUNITY)
Admission: RE | Admit: 2020-10-29 | Discharge: 2020-10-29 | Disposition: A | Discharge status: Home / Self Care | Payer: Managed Care, Other (non HMO) | Source: Ambulatory Visit | Attending: Cardiothoracic Surgery | Admitting: Cardiothoracic Surgery

## 2020-10-29 ENCOUNTER — Other Ambulatory Visit: Payer: Self-pay

## 2020-10-29 ENCOUNTER — Encounter (HOSPITAL_COMMUNITY): Payer: Self-pay

## 2020-10-29 ENCOUNTER — Ambulatory Visit (HOSPITAL_COMMUNITY)
Admission: RE | Admit: 2020-10-29 | Discharge: 2020-10-29 | Disposition: A | Discharge status: Home / Self Care | Payer: Managed Care, Other (non HMO) | Source: Ambulatory Visit | Attending: Cardiothoracic Surgery | Admitting: Cardiothoracic Surgery

## 2020-10-29 ENCOUNTER — Ambulatory Visit: Payer: Managed Care, Other (non HMO) | Admitting: Medical

## 2020-10-29 DIAGNOSIS — I2510 Atherosclerotic heart disease of native coronary artery without angina pectoris: Secondary | ICD-10-CM

## 2020-10-29 DIAGNOSIS — Z01812 Encounter for preprocedural laboratory examination: Secondary | ICD-10-CM | POA: Insufficient documentation

## 2020-10-29 DIAGNOSIS — I509 Heart failure, unspecified: Secondary | ICD-10-CM | POA: Insufficient documentation

## 2020-10-29 DIAGNOSIS — I21A9 Other myocardial infarction type: Secondary | ICD-10-CM

## 2020-10-29 DIAGNOSIS — Z20822 Contact with and (suspected) exposure to covid-19: Secondary | ICD-10-CM | POA: Insufficient documentation

## 2020-10-29 HISTORY — DX: Gastro-esophageal reflux disease without esophagitis: K21.9

## 2020-10-29 LAB — COMPREHENSIVE METABOLIC PANEL
ALT: 17 U/L (ref 0–44)
AST: 20 U/L (ref 15–41)
Albumin: 4 g/dL (ref 3.5–5.0)
Alkaline Phosphatase: 40 U/L (ref 38–126)
Anion gap: 9 (ref 5–15)
BUN: 18 mg/dL (ref 8–23)
CO2: 21 mmol/L — ABNORMAL LOW (ref 22–32)
Calcium: 9.2 mg/dL (ref 8.9–10.3)
Chloride: 107 mmol/L (ref 98–111)
Creatinine, Ser: 1.39 mg/dL — ABNORMAL HIGH (ref 0.61–1.24)
GFR, Estimated: 54 mL/min — ABNORMAL LOW (ref >60)
Glucose, Bld: 156 mg/dL — ABNORMAL HIGH (ref 70–99)
Potassium: 4.5 mmol/L (ref 3.5–5.1)
Sodium: 137 mmol/L (ref 135–145)
Total Bilirubin: 0.6 mg/dL (ref 0.3–1.2)
Total Protein: 6.5 g/dL (ref 6.5–8.1)

## 2020-10-29 LAB — APTT: aPTT: 34 seconds (ref 24–36)

## 2020-10-29 LAB — BLOOD GAS, ARTERIAL
Acid-base deficit: 1.9 mmol/L (ref 0.0–2.0)
Bicarbonate: 22.5 mmol/L (ref 20.0–28.0)
Drawn by: 602861
FIO2: 21
O2 Saturation: 96.8 %
Patient temperature: 37
pCO2 arterial: 39.5 mmHg (ref 32.0–48.0)
pH, Arterial: 7.374 (ref 7.350–7.450)
pO2, Arterial: 93.1 mmHg (ref 83.0–108.0)

## 2020-10-29 LAB — URINALYSIS, ROUTINE W REFLEX MICROSCOPIC
Bilirubin Urine: NEGATIVE
Glucose, UA: >=500 mg/dL — AB
Hgb urine dipstick: NEGATIVE
Ketones, ur: NEGATIVE mg/dL
Leukocytes,Ua: NEGATIVE
Nitrite: NEGATIVE
Protein, ur: NEGATIVE mg/dL
Specific Gravity, Urine: 1.017 (ref 1.005–1.030)
pH: 6 (ref 5.0–8.0)

## 2020-10-29 LAB — CBC
HCT: 46.9 % (ref 39.0–52.0)
Hemoglobin: 15.1 g/dL (ref 13.0–17.0)
MCH: 30.8 pg (ref 26.0–34.0)
MCHC: 32.2 g/dL (ref 30.0–36.0)
MCV: 95.5 fL (ref 80.0–100.0)
Platelets: 254 10*3/uL (ref 150–400)
RBC: 4.91 MIL/uL (ref 4.22–5.81)
RDW: 14.5 % (ref 11.5–15.5)
WBC: 8.4 10*3/uL (ref 4.0–10.5)
nRBC: 0 % (ref 0.0–0.2)

## 2020-10-29 LAB — GLUCOSE, CAPILLARY: Glucose-Capillary: 215 mg/dL — ABNORMAL HIGH (ref 70–99)

## 2020-10-29 LAB — SURGICAL PCR SCREEN
MRSA, PCR: NEGATIVE
Staphylococcus aureus: NEGATIVE

## 2020-10-29 LAB — PROTIME-INR
INR: 1.1 (ref 0.8–1.2)
Prothrombin Time: 13.9 seconds (ref 11.4–15.2)

## 2020-10-29 LAB — SARS CORONAVIRUS 2 (TAT 6-24 HRS): SARS Coronavirus 2: NEGATIVE

## 2020-10-29 LAB — TYPE AND SCREEN
ABO/RH(D): O POS
Antibody Screen: NEGATIVE

## 2020-10-29 NOTE — Progress Notes (Signed)
Pre-CABG completed. Refer to "CV Proc" under chart review to view preliminary results.  10/29/2020 4:42 PM Eula Fried., MHA, RVT, RDCS, RDMS

## 2020-10-29 NOTE — Progress Notes (Signed)
Surgical Instructions    Your procedure is scheduled on 10/31/20.  Report to [facility] Main Entrance "A" at 5:30 A.M., then check in with the Admitting office.  Call this number if you have problems the morning of surgery:  [phone]   If you have any questions prior to your surgery date call [phone]: Open Monday-Friday 8am-4pm    Remember:  Do not eat or drink after midnight the night before your surgery     Take these medicines the morning of surgery with A SIP OF WATER:  atorvastatin (LIPITOR)  ezetimibe (ZETIA) fenofibrate (TRICOR)  metoprolol succinate (TOPROL-XL) omeprazole (PRILOSEC)  If needed: acetaminophen (TYLENOL) nitroGLYCERIN (NITROSTAT)  As of today, STOP taking Aleve, Naproxen, Ibuprofen, Motrin, Advil, Goody's, BC's, all herbal medications, fish oil, and all vitamins.  WHAT DO I DO ABOUT MY DIABETES MEDICATION?   Do not take oral diabetes medicines (pills) the morning of surgery.  Do Not take Jardiance daly before or day of surgery.  HOW TO MANAGE YOUR DIABETES BEFORE AND AFTER SURGERY  Why is it important to control my blood sugar before and after surgery? Improving blood sugar levels before and after surgery helps healing and can limit problems. A way of improving blood sugar control is eating a healthy diet by:  Eating less sugar and carbohydrates  Increasing activity/exercise  Talking with your doctor about reaching your blood sugar goals High blood sugars (greater than 180 mg/dL) can raise your risk of infections and slow your recovery, so you will need to focus on controlling your diabetes during the weeks before surgery. Make sure that the doctor who takes care of your diabetes knows about your planned surgery including the date and location.  How do I manage my blood sugar before surgery? Check your blood sugar at least 4 times a day, starting 2 days before surgery, to make sure that the level is not too high or low.  Check your blood  sugar the morning of your surgery when you wake up and every 2 hours until you get to the Short Stay unit.  If your blood sugar is less than 70 mg/dL, you will need to treat for low blood sugar: Do not take insulin. Treat a low blood sugar (less than 70 mg/dL) with  cup of clear juice (cranberry or apple), 4 glucose tablets, OR glucose gel. Recheck blood sugar in 15 minutes after treatment (to make sure it is greater than 70 mg/dL). If your blood sugar is not greater than 70 mg/dL on recheck, call [phone] for further instructions. Report your blood sugar to the short stay nurse when you get to Short Stay.  If you are admitted to the hospital after surgery: Your blood sugar will be checked by the staff and you will probably be given insulin after surgery (instead of oral diabetes medicines) to make sure you have good blood sugar levels. The goal for blood sugar control after surgery is 80-180 mg/dL.           Do not wear jewelry Do not wear lotions, powders, colognes, or deodorant. Do not shave 48 hours prior to surgery.  Men may shave face and neck. Do not bring valuables to the hospital.             [facility] is not responsible for any belongings or valuables.  Do NOT Smoke (Tobacco/Vaping) or drink Alcohol 24 hours prior to your procedure If you use a CPAP at night, you may bring all equipment for your overnight  stay.   Contacts, glasses, dentures or bridgework may not be worn into surgery, please bring cases for these belongings   For patients admitted to the hospital, discharge time will be determined by your treatment team.   Patients discharged the day of surgery will not be allowed to drive home, and someone needs to stay with them for 24 hours.  ONLY 1 SUPPORT PERSON MAY BE PRESENT WHILE YOU ARE IN SURGERY. IF YOU ARE TO BE ADMITTED ONCE YOU ARE IN YOUR ROOM YOU WILL BE ALLOWED TWO (2) VISITORS.  Minor children may have two parents present. Special consideration for safety  and communication needs will be reviewed on a case by case basis.  Special instructions:    Oral Hygiene is also important to reduce your risk of infection.  Remember - BRUSH YOUR TEETH THE MORNING OF SURGERY WITH YOUR REGULAR TOOTHPASTE   [place]- Preparing For Surgery  Before surgery, you can play an important role. Because skin is not sterile, your skin needs to be as free of germs as possible. You can reduce the number of germs on your skin by washing with CHG (chlorahexidine gluconate) Soap before surgery.  CHG is an antiseptic cleaner which kills germs and bonds with the skin to continue killing germs even after washing.     Please do not use if you have an allergy to CHG or antibacterial soaps. If your skin becomes reddened/irritated stop using the CHG.  Do not shave (including legs and underarms) for at least 48 hours prior to first CHG shower. It is OK to shave your face.  Please follow these instructions carefully.     Shower the NIGHT BEFORE SURGERY and the MORNING OF SURGERY with CHG Soap.   If you chose to wash your hair, wash your hair first as usual with your normal shampoo. After you shampoo, rinse your hair and body thoroughly to remove the shampoo.  Then [employer] and genitals (private parts) with your normal soap and rinse thoroughly to remove soap.  After that Use CHG Soap as you would any other liquid soap. You can apply CHG directly to the skin and wash gently with a scrungie or a clean washcloth.   Apply the CHG Soap to your body ONLY FROM THE NECK DOWN.  Do not use on open wounds or open sores. Avoid contact with your eyes, ears, mouth and genitals (private parts). Wash Face and genitals (private parts)  with your normal soap.   Wash thoroughly, paying special attention to the area where your surgery will be performed.  Thoroughly rinse your body with warm water from the neck down.  DO NOT shower/wash with your normal soap after using and rinsing off the CHG  Soap.  Pat yourself dry with a CLEAN TOWEL.  Wear CLEAN PAJAMAS to bed the night before surgery  Place CLEAN SHEETS on your bed the night before your surgery  DO NOT SLEEP WITH PETS.   Day of Surgery:  Take a shower with CHG soap. Wear Clean/Comfortable clothing the morning of surgery Do not apply any deodorants/lotions.   Remember to brush your teeth WITH YOUR REGULAR TOOTHPASTE.   Please read over the following fact sheets that you were given.

## 2020-10-29 NOTE — Progress Notes (Signed)
PCP: Dr. Saralyn Pilar, MD Cardiologist: Yvonne Kendall, MD  EKG: 10/17/20 CXR: 10/29/20 ECHO: 10/08/20 Stress Test: 5/22.  Requested records Cardiac Cath: 10/20/20  Fasting Blood Sugar- 130-low 200's Checks Blood Sugar__1_ times a day  OSA/CPAP: No  ASA: Continue ASA through day before surgery Blood Thinners: No  Covid test 10/29/20 at PAT  Anesthesia Review: Yes, cardiac history  Patient denies shortness of breath, fever, cough, and chest pain at PAT appointment.  Patient verbalized understanding of instructions provided today at the PAT appointment.  Patient asked to review instructions at home and day of surgery.

## 2020-10-30 MED ORDER — INSULIN REGULAR(HUMAN) IN NACL 100-0.9 UT/100ML-% IV SOLN
INTRAVENOUS | Status: AC
  Administered 2020-10-31: 4 [IU]/h via INTRAVENOUS
  Filled 2020-10-30: qty 100

## 2020-10-30 MED ORDER — MILRINONE LACTATE IN DEXTROSE 20-5 MG/100ML-% IV SOLN
0.3000 ug/kg/min | INTRAVENOUS | Status: AC
  Administered 2020-10-31: .25 ug/kg/min via INTRAVENOUS
  Filled 2020-10-30: qty 100

## 2020-10-30 MED ORDER — CEFAZOLIN SODIUM-DEXTROSE 2-4 GM/100ML-% IV SOLN
2.0000 g | INTRAVENOUS | Status: AC
  Administered 2020-10-31 (×2): 2 g via INTRAVENOUS
  Filled 2020-10-30: qty 100

## 2020-10-30 MED ORDER — NITROGLYCERIN IN D5W 200-5 MCG/ML-% IV SOLN
2.0000 ug/min | INTRAVENOUS | Status: DC
Start: 2020-10-31 — End: 2020-10-31
  Filled 2020-10-30: qty 250

## 2020-10-30 MED ORDER — MAGNESIUM SULFATE 50 % IJ SOLN
40.0000 meq | INTRAMUSCULAR | Status: DC
  Filled 2020-10-30: qty 9.85

## 2020-10-30 MED ORDER — TRANEXAMIC ACID (OHS) PUMP PRIME SOLUTION
2.0000 mg/kg | INTRAVENOUS | Status: DC
  Filled 2020-10-30: qty 1.85

## 2020-10-30 MED ORDER — VANCOMYCIN HCL 1500 MG/300ML IV SOLN
1500.0000 mg | INTRAVENOUS | Status: AC
  Administered 2020-10-31: 1500 mg via INTRAVENOUS
  Filled 2020-10-30: qty 300

## 2020-10-30 MED ORDER — TRANEXAMIC ACID 1000 MG/10ML IV SOLN
1.5000 mg/kg/h | INTRAVENOUS | Status: AC
  Administered 2020-10-31: 1.5 mg/kg/h via INTRAVENOUS
  Filled 2020-10-30: qty 25

## 2020-10-30 MED ORDER — PLASMA-LYTE A IV SOLN
INTRAVENOUS | Status: DC
  Filled 2020-10-30: qty 5

## 2020-10-30 MED ORDER — CEFAZOLIN SODIUM-DEXTROSE 2-4 GM/100ML-% IV SOLN
2.0000 g | INTRAVENOUS | Status: DC
  Filled 2020-10-30: qty 100

## 2020-10-30 MED ORDER — NOREPINEPHRINE 4 MG/250ML-% IV SOLN
0.0000 ug/min | INTRAVENOUS | Status: DC
  Filled 2020-10-30: qty 250

## 2020-10-30 MED ORDER — EPINEPHRINE HCL 5 MG/250ML IV SOLN IN NS
0.0000 ug/min | INTRAVENOUS | Status: AC
  Administered 2020-10-31: 2 ug/min via INTRAVENOUS
  Filled 2020-10-30: qty 250

## 2020-10-30 MED ORDER — SODIUM CHLORIDE 0.9 % IV SOLN
INTRAVENOUS | Status: DC
  Filled 2020-10-30: qty 30

## 2020-10-30 MED ORDER — POTASSIUM CHLORIDE 2 MEQ/ML IV SOLN
80.0000 meq | INTRAVENOUS | Status: DC
  Filled 2020-10-30: qty 40

## 2020-10-30 MED ORDER — DEXMEDETOMIDINE HCL IN NACL 400 MCG/100ML IV SOLN
0.1000 ug/kg/h | INTRAVENOUS | Status: AC
  Administered 2020-10-31: .4 ug/kg/h via INTRAVENOUS
  Filled 2020-10-30: qty 100

## 2020-10-30 MED ORDER — TRANEXAMIC ACID (OHS) BOLUS VIA INFUSION
15.0000 mg/kg | INTRAVENOUS | Status: AC
  Administered 2020-10-31: 1389 mg via INTRAVENOUS
  Filled 2020-10-30: qty 1389

## 2020-10-30 MED ORDER — PHENYLEPHRINE HCL-NACL 20-0.9 MG/250ML-% IV SOLN
30.0000 ug/min | INTRAVENOUS | Status: AC
  Administered 2020-10-31: 20 ug/min via INTRAVENOUS
  Filled 2020-10-30: qty 250

## 2020-10-30 NOTE — Anesthesia Preprocedure Evaluation (Addendum)
Anesthesia Evaluation  Patient identified by MRN, date of birth, ID band Patient awake    Reviewed: Allergy & Precautions, NPO status , Patient's Chart, lab work & pertinent test results, reviewed documented beta blocker date and time   Airway Mallampati: II  TM Distance: >3 FB Neck ROM: Full    Dental  (+) Dental Advisory Given, Poor Dentition   Pulmonary former smoker,  10/29/2020 SARS coronavirus NEG   Pulmonary exam normal breath sounds clear to auscultation       Cardiovascular hypertension, Pt. on medications and Pt. on home beta blockers + angina + CAD (severe 3v ASCAD), + Past MI and + Cardiac Stents  Normal cardiovascular exam Rhythm:Regular Rate:Normal  09/2020 ECHO: EF 25-30%, severely decreased LVF, global hypokinesis, mild LVH, mild MR   Neuro/Psych Anxiety    GI/Hepatic GERD  Medicated,  Endo/Other  diabetes, Oral Hypoglycemic Agents  Renal/GU Renal InsufficiencyRenal disease     Musculoskeletal   Abdominal   Peds  Hematology   Anesthesia Other Findings   Reproductive/Obstetrics                            Anesthesia Physical Anesthesia Plan  ASA: 4  Anesthesia Plan: General   Post-op Pain Management:    Induction: Intravenous  PONV Risk Score and Plan: 2 and Treatment may vary due to age or medical condition  Airway Management Planned: Oral ETT  Additional Equipment: Arterial line, PA Cath, TEE, Ultrasound Guidance Line Placement and CVP  Intra-op Plan:   Post-operative Plan: Post-operative intubation/ventilation  Informed Consent: I have reviewed the patients History and Physical, chart, labs and discussed the procedure including the risks, benefits and alternatives for the proposed anesthesia with the patient or authorized representative who has indicated his/her understanding and acceptance.     Dental advisory given  Plan Discussed with: CRNA  Anesthesia  Plan Comments:        Anesthesia Quick Evaluation

## 2020-10-31 ENCOUNTER — Inpatient Hospital Stay (HOSPITAL_COMMUNITY): Payer: Managed Care, Other (non HMO)

## 2020-10-31 ENCOUNTER — Inpatient Hospital Stay (HOSPITAL_COMMUNITY): Payer: Managed Care, Other (non HMO) | Admitting: Certified Registered Nurse Anesthetist

## 2020-10-31 ENCOUNTER — Inpatient Hospital Stay (HOSPITAL_COMMUNITY)
Admission: RE | Disposition: A | Discharge status: Home / Self Care | Payer: Self-pay | Source: Home / Self Care | Attending: Cardiothoracic Surgery

## 2020-10-31 ENCOUNTER — Other Ambulatory Visit: Payer: Self-pay

## 2020-10-31 ENCOUNTER — Inpatient Hospital Stay (HOSPITAL_COMMUNITY): Payer: Managed Care, Other (non HMO) | Admitting: Physician Assistant

## 2020-10-31 ENCOUNTER — Encounter (HOSPITAL_COMMUNITY): Payer: Self-pay | Admitting: Cardiothoracic Surgery

## 2020-10-31 ENCOUNTER — Inpatient Hospital Stay (HOSPITAL_COMMUNITY)
Admission: RE | Admit: 2020-10-31 | Discharge: 2020-11-04 | DRG: 236 | Disposition: A | Discharge status: Home / Self Care | Payer: Managed Care, Other (non HMO) | Attending: Cardiothoracic Surgery | Admitting: Cardiothoracic Surgery

## 2020-10-31 DIAGNOSIS — D62 Acute posthemorrhagic anemia: Secondary | ICD-10-CM | POA: Diagnosis not present

## 2020-10-31 DIAGNOSIS — K219 Gastro-esophageal reflux disease without esophagitis: Secondary | ICD-10-CM | POA: Diagnosis present

## 2020-10-31 DIAGNOSIS — I10 Essential (primary) hypertension: Secondary | ICD-10-CM | POA: Diagnosis present

## 2020-10-31 DIAGNOSIS — I2510 Atherosclerotic heart disease of native coronary artery without angina pectoris: Secondary | ICD-10-CM

## 2020-10-31 DIAGNOSIS — Z20822 Contact with and (suspected) exposure to covid-19: Secondary | ICD-10-CM | POA: Diagnosis present

## 2020-10-31 DIAGNOSIS — Z8249 Family history of ischemic heart disease and other diseases of the circulatory system: Secondary | ICD-10-CM | POA: Diagnosis not present

## 2020-10-31 DIAGNOSIS — E119 Type 2 diabetes mellitus without complications: Secondary | ICD-10-CM | POA: Diagnosis present

## 2020-10-31 DIAGNOSIS — Z87891 Personal history of nicotine dependence: Secondary | ICD-10-CM

## 2020-10-31 DIAGNOSIS — Z955 Presence of coronary angioplasty implant and graft: Secondary | ICD-10-CM

## 2020-10-31 DIAGNOSIS — J939 Pneumothorax, unspecified: Secondary | ICD-10-CM

## 2020-10-31 DIAGNOSIS — J9811 Atelectasis: Secondary | ICD-10-CM

## 2020-10-31 DIAGNOSIS — E1140 Type 2 diabetes mellitus with diabetic neuropathy, unspecified: Secondary | ICD-10-CM

## 2020-10-31 DIAGNOSIS — Z951 Presence of aortocoronary bypass graft: Secondary | ICD-10-CM

## 2020-10-31 DIAGNOSIS — K5900 Constipation, unspecified: Secondary | ICD-10-CM | POA: Diagnosis present

## 2020-10-31 DIAGNOSIS — I252 Old myocardial infarction: Secondary | ICD-10-CM | POA: Diagnosis not present

## 2020-10-31 DIAGNOSIS — I6521 Occlusion and stenosis of right carotid artery: Secondary | ICD-10-CM | POA: Diagnosis present

## 2020-10-31 DIAGNOSIS — R079 Chest pain, unspecified: Secondary | ICD-10-CM

## 2020-10-31 DIAGNOSIS — I509 Heart failure, unspecified: Secondary | ICD-10-CM

## 2020-10-31 HISTORY — PX: TEE WITHOUT CARDIOVERSION: SHX5443

## 2020-10-31 HISTORY — PX: CORONARY ARTERY BYPASS GRAFT: SHX141

## 2020-10-31 HISTORY — PX: ENDOVEIN HARVEST OF GREATER SAPHENOUS VEIN: SHX5059

## 2020-10-31 LAB — CBC
HCT: 41.6 % (ref 39.0–52.0)
HCT: 38.1 % — ABNORMAL LOW (ref 39.0–52.0)
Hemoglobin: 13.2 g/dL (ref 13.0–17.0)
Hemoglobin: 12.1 g/dL — ABNORMAL LOW (ref 13.0–17.0)
MCH: 30.6 pg (ref 26.0–34.0)
MCH: 30.6 pg (ref 26.0–34.0)
MCHC: 31.7 g/dL (ref 30.0–36.0)
MCHC: 31.8 g/dL (ref 30.0–36.0)
MCV: 96.3 fL (ref 80.0–100.0)
MCV: 96.5 fL (ref 80.0–100.0)
Platelets: 196 10*3/uL (ref 150–400)
Platelets: 195 10*3/uL (ref 150–400)
RBC: 4.32 MIL/uL (ref 4.22–5.81)
RBC: 3.95 MIL/uL — ABNORMAL LOW (ref 4.22–5.81)
RDW: 14.2 % (ref 11.5–15.5)
RDW: 14.3 % (ref 11.5–15.5)
WBC: 15.6 10*3/uL — ABNORMAL HIGH (ref 4.0–10.5)
WBC: 12 10*3/uL — ABNORMAL HIGH (ref 4.0–10.5)
nRBC: 0 % (ref 0.0–0.2)
nRBC: 0 % (ref 0.0–0.2)

## 2020-10-31 LAB — POCT I-STAT, CHEM 8
BUN: 18 mg/dL (ref 8–23)
BUN: 18 mg/dL (ref 8–23)
BUN: 17 mg/dL (ref 8–23)
BUN: 16 mg/dL (ref 8–23)
Calcium, Ion: 1.33 mmol/L (ref 1.15–1.40)
Calcium, Ion: 1.27 mmol/L (ref 1.15–1.40)
Calcium, Ion: 1.14 mmol/L — ABNORMAL LOW (ref 1.15–1.40)
Calcium, Ion: 1.29 mmol/L (ref 1.15–1.40)
Chloride: 102 mmol/L (ref 98–111)
Chloride: 103 mmol/L (ref 98–111)
Chloride: 103 mmol/L (ref 98–111)
Chloride: 103 mmol/L (ref 98–111)
Creatinine, Ser: 1.3 mg/dL — ABNORMAL HIGH (ref 0.61–1.24)
Creatinine, Ser: 1.2 mg/dL (ref 0.61–1.24)
Creatinine, Ser: 1.1 mg/dL (ref 0.61–1.24)
Creatinine, Ser: 1.1 mg/dL (ref 0.61–1.24)
Glucose, Bld: 157 mg/dL — ABNORMAL HIGH (ref 70–99)
Glucose, Bld: 153 mg/dL — ABNORMAL HIGH (ref 70–99)
Glucose, Bld: 125 mg/dL — ABNORMAL HIGH (ref 70–99)
Glucose, Bld: 176 mg/dL — ABNORMAL HIGH (ref 70–99)
HCT: 46 % (ref 39.0–52.0)
HCT: 41 % (ref 39.0–52.0)
HCT: 36 % — ABNORMAL LOW (ref 39.0–52.0)
HCT: 33 % — ABNORMAL LOW (ref 39.0–52.0)
Hemoglobin: 15.6 g/dL (ref 13.0–17.0)
Hemoglobin: 13.9 g/dL (ref 13.0–17.0)
Hemoglobin: 12.2 g/dL — ABNORMAL LOW (ref 13.0–17.0)
Hemoglobin: 11.2 g/dL — ABNORMAL LOW (ref 13.0–17.0)
Potassium: 4.4 mmol/L (ref 3.5–5.1)
Potassium: 4.3 mmol/L (ref 3.5–5.1)
Potassium: 5.1 mmol/L (ref 3.5–5.1)
Potassium: 4.6 mmol/L (ref 3.5–5.1)
Sodium: 140 mmol/L (ref 135–145)
Sodium: 138 mmol/L (ref 135–145)
Sodium: 137 mmol/L (ref 135–145)
Sodium: 137 mmol/L (ref 135–145)
TCO2: 28 mmol/L (ref 22–32)
TCO2: 24 mmol/L (ref 22–32)
TCO2: 25 mmol/L (ref 22–32)
TCO2: 23 mmol/L (ref 22–32)

## 2020-10-31 LAB — POCT I-STAT 7, (LYTES, BLD GAS, ICA,H+H)
Acid-Base Excess: 0 mmol/L (ref 0.0–2.0)
Acid-Base Excess: 2 mmol/L (ref 0.0–2.0)
Acid-base deficit: 1 mmol/L (ref 0.0–2.0)
Acid-base deficit: 2 mmol/L (ref 0.0–2.0)
Acid-base deficit: 2 mmol/L (ref 0.0–2.0)
Acid-base deficit: 5 mmol/L — ABNORMAL HIGH (ref 0.0–2.0)
Bicarbonate: 25.3 mmol/L (ref 20.0–28.0)
Bicarbonate: 24.5 mmol/L (ref 20.0–28.0)
Bicarbonate: 25.6 mmol/L (ref 20.0–28.0)
Bicarbonate: 24.4 mmol/L (ref 20.0–28.0)
Bicarbonate: 24.4 mmol/L (ref 20.0–28.0)
Bicarbonate: 21.6 mmol/L (ref 20.0–28.0)
Calcium, Ion: 0.99 mmol/L — ABNORMAL LOW (ref 1.15–1.40)
Calcium, Ion: 1.07 mmol/L — ABNORMAL LOW (ref 1.15–1.40)
Calcium, Ion: 1.33 mmol/L (ref 1.15–1.40)
Calcium, Ion: 1.25 mmol/L (ref 1.15–1.40)
Calcium, Ion: 1.24 mmol/L (ref 1.15–1.40)
Calcium, Ion: 1.27 mmol/L (ref 1.15–1.40)
HCT: 34 % — ABNORMAL LOW (ref 39.0–52.0)
HCT: 33 % — ABNORMAL LOW (ref 39.0–52.0)
HCT: 33 % — ABNORMAL LOW (ref 39.0–52.0)
HCT: 39 % (ref 39.0–52.0)
HCT: 35 % — ABNORMAL LOW (ref 39.0–52.0)
HCT: 36 % — ABNORMAL LOW (ref 39.0–52.0)
Hemoglobin: 11.6 g/dL — ABNORMAL LOW (ref 13.0–17.0)
Hemoglobin: 11.2 g/dL — ABNORMAL LOW (ref 13.0–17.0)
Hemoglobin: 11.2 g/dL — ABNORMAL LOW (ref 13.0–17.0)
Hemoglobin: 13.3 g/dL (ref 13.0–17.0)
Hemoglobin: 11.9 g/dL — ABNORMAL LOW (ref 13.0–17.0)
Hemoglobin: 12.2 g/dL — ABNORMAL LOW (ref 13.0–17.0)
O2 Saturation: 100 %
O2 Saturation: 100 %
O2 Saturation: 100 %
O2 Saturation: 95 %
O2 Saturation: 98 %
O2 Saturation: 97 %
Patient temperature: 35.2
Patient temperature: 37.1
Patient temperature: 37.5
Potassium: 4.4 mmol/L (ref 3.5–5.1)
Potassium: 4.6 mmol/L (ref 3.5–5.1)
Potassium: 5.1 mmol/L (ref 3.5–5.1)
Potassium: 4.6 mmol/L (ref 3.5–5.1)
Potassium: 4.2 mmol/L (ref 3.5–5.1)
Potassium: 4.4 mmol/L (ref 3.5–5.1)
Sodium: 140 mmol/L (ref 135–145)
Sodium: 137 mmol/L (ref 135–145)
Sodium: 137 mmol/L (ref 135–145)
Sodium: 140 mmol/L (ref 135–145)
Sodium: 142 mmol/L (ref 135–145)
Sodium: 140 mmol/L (ref 135–145)
TCO2: 27 mmol/L (ref 22–32)
TCO2: 26 mmol/L (ref 22–32)
TCO2: 27 mmol/L (ref 22–32)
TCO2: 26 mmol/L (ref 22–32)
TCO2: 26 mmol/L (ref 22–32)
TCO2: 23 mmol/L (ref 22–32)
pCO2 arterial: 44.2 mmHg (ref 32.0–48.0)
pCO2 arterial: 36.7 mmHg (ref 32.0–48.0)
pCO2 arterial: 41.3 mmHg (ref 32.0–48.0)
pCO2 arterial: 43 mmHg (ref 32.0–48.0)
pCO2 arterial: 44.9 mmHg (ref 32.0–48.0)
pCO2 arterial: 43.8 mmHg (ref 32.0–48.0)
pH, Arterial: 7.366 (ref 7.350–7.450)
pH, Arterial: 7.382 (ref 7.350–7.450)
pH, Arterial: 7.451 — ABNORMAL HIGH (ref 7.350–7.450)
pH, Arterial: 7.352 (ref 7.350–7.450)
pH, Arterial: 7.343 — ABNORMAL LOW (ref 7.350–7.450)
pH, Arterial: 7.303 — ABNORMAL LOW (ref 7.350–7.450)
pO2, Arterial: 354 mmHg — ABNORMAL HIGH (ref 83.0–108.0)
pO2, Arterial: 195 mmHg — ABNORMAL HIGH (ref 83.0–108.0)
pO2, Arterial: 344 mmHg — ABNORMAL HIGH (ref 83.0–108.0)
pO2, Arterial: 75 mmHg — ABNORMAL LOW (ref 83.0–108.0)
pO2, Arterial: 114 mmHg — ABNORMAL HIGH (ref 83.0–108.0)
pO2, Arterial: 99 mmHg (ref 83.0–108.0)

## 2020-10-31 LAB — GLUCOSE, CAPILLARY
Glucose-Capillary: 160 mg/dL — ABNORMAL HIGH (ref 70–99)
Glucose-Capillary: 141 mg/dL — ABNORMAL HIGH (ref 70–99)
Glucose-Capillary: 136 mg/dL — ABNORMAL HIGH (ref 70–99)
Glucose-Capillary: 111 mg/dL — ABNORMAL HIGH (ref 70–99)
Glucose-Capillary: 133 mg/dL — ABNORMAL HIGH (ref 70–99)
Glucose-Capillary: 102 mg/dL — ABNORMAL HIGH (ref 70–99)
Glucose-Capillary: 123 mg/dL — ABNORMAL HIGH (ref 70–99)
Glucose-Capillary: 106 mg/dL — ABNORMAL HIGH (ref 70–99)
Glucose-Capillary: 147 mg/dL — ABNORMAL HIGH (ref 70–99)
Glucose-Capillary: 140 mg/dL — ABNORMAL HIGH (ref 70–99)
Glucose-Capillary: 118 mg/dL — ABNORMAL HIGH (ref 70–99)

## 2020-10-31 LAB — PLATELET COUNT: Platelets: 184 10*3/uL (ref 150–400)

## 2020-10-31 LAB — HEMOGLOBIN AND HEMATOCRIT, BLOOD
HCT: 34.3 % — ABNORMAL LOW (ref 39.0–52.0)
Hemoglobin: 11.3 g/dL — ABNORMAL LOW (ref 13.0–17.0)

## 2020-10-31 LAB — POCT I-STAT EG7
Acid-base deficit: 1 mmol/L (ref 0.0–2.0)
Bicarbonate: 24.7 mmol/L (ref 20.0–28.0)
Calcium, Ion: 1.07 mmol/L — ABNORMAL LOW (ref 1.15–1.40)
HCT: 34 % — ABNORMAL LOW (ref 39.0–52.0)
Hemoglobin: 11.6 g/dL — ABNORMAL LOW (ref 13.0–17.0)
O2 Saturation: 78 %
Potassium: 4.1 mmol/L (ref 3.5–5.1)
Sodium: 139 mmol/L (ref 135–145)
TCO2: 26 mmol/L (ref 22–32)
pCO2, Ven: 46.4 mmHg (ref 44.0–60.0)
pH, Ven: 7.334 (ref 7.250–7.430)
pO2, Ven: 45 mmHg (ref 32.0–45.0)

## 2020-10-31 LAB — BASIC METABOLIC PANEL
Anion gap: 6 (ref 5–15)
BUN: 14 mg/dL (ref 8–23)
CO2: 23 mmol/L (ref 22–32)
Calcium: 8.3 mg/dL — ABNORMAL LOW (ref 8.9–10.3)
Chloride: 109 mmol/L (ref 98–111)
Creatinine, Ser: 1.28 mg/dL — ABNORMAL HIGH (ref 0.61–1.24)
GFR, Estimated: 60 mL/min — ABNORMAL LOW (ref >60)
Glucose, Bld: 126 mg/dL — ABNORMAL HIGH (ref 70–99)
Potassium: 4.4 mmol/L (ref 3.5–5.1)
Sodium: 138 mmol/L (ref 135–145)

## 2020-10-31 LAB — ECHO INTRAOPERATIVE TEE
AV Mean grad: 3 mmHg
Height: 70 in
Weight: 3371.42 oz

## 2020-10-31 LAB — ABO/RH: ABO/RH(D): O POS

## 2020-10-31 LAB — PROTIME-INR
INR: 1.5 — ABNORMAL HIGH (ref 0.8–1.2)
Prothrombin Time: 17.7 seconds — ABNORMAL HIGH (ref 11.4–15.2)

## 2020-10-31 LAB — APTT: aPTT: 32 seconds (ref 24–36)

## 2020-10-31 LAB — HEMOGLOBIN A1C
Hgb A1c MFr Bld: 7.3 % — ABNORMAL HIGH (ref 4.8–5.6)
Mean Plasma Glucose: 163 mg/dL

## 2020-10-31 LAB — MAGNESIUM: Magnesium: 3 mg/dL — ABNORMAL HIGH (ref 1.7–2.4)

## 2020-10-31 SURGERY — CORONARY ARTERY BYPASS GRAFTING (CABG)
Anesthesia: General | Site: Chest | Laterality: Right

## 2020-10-31 MED ORDER — MILRINONE LACTATE IN DEXTROSE 20-5 MG/100ML-% IV SOLN
0.0000 ug/kg/min | INTRAVENOUS | Status: DC
  Administered 2020-11-01: 0.25 ug/kg/min via INTRAVENOUS
  Filled 2020-10-31 (×2): qty 100

## 2020-10-31 MED ORDER — PROPOFOL 10 MG/ML IV BOLUS
INTRAVENOUS | Status: AC
  Filled 2020-10-31: qty 20

## 2020-10-31 MED ORDER — CEFAZOLIN SODIUM-DEXTROSE 2-4 GM/100ML-% IV SOLN
2.0000 g | Freq: Three times a day (TID) | INTRAVENOUS | Status: AC
  Administered 2020-10-31 – 2020-11-02 (×6): 2 g via INTRAVENOUS
  Filled 2020-10-31 (×6): qty 100

## 2020-10-31 MED ORDER — LACTATED RINGERS IV SOLN: INTRAVENOUS | Status: DC | PRN

## 2020-10-31 MED ORDER — ACETAMINOPHEN 160 MG/5ML PO SOLN: 1000.0000 mg | Freq: Four times a day (QID) | ORAL | Status: DC

## 2020-10-31 MED ORDER — ALBUMIN HUMAN 5 % IV SOLN: INTRAVENOUS | Status: DC | PRN

## 2020-10-31 MED ORDER — LACTATED RINGERS IV SOLN: 500.0000 mL | Freq: Once | INTRAVENOUS | Status: DC | PRN

## 2020-10-31 MED ORDER — MIDAZOLAM HCL (PF) 10 MG/2ML IJ SOLN
INTRAMUSCULAR | Status: AC
  Filled 2020-10-31: qty 2

## 2020-10-31 MED ORDER — CHLORHEXIDINE GLUCONATE 4 % EX LIQD: 30.0000 mL | CUTANEOUS | Status: DC

## 2020-10-31 MED ORDER — SODIUM CHLORIDE 0.9 % IV SOLN: INTRAVENOUS | Status: DC

## 2020-10-31 MED ORDER — PLATELET POOR PLASMA OPTIME
Status: DC | PRN
  Administered 2020-10-31: 10 mL

## 2020-10-31 MED ORDER — STERILE WATER FOR INJECTION IJ SOLN
INTRAMUSCULAR | Status: AC
  Filled 2020-10-31: qty 10

## 2020-10-31 MED ORDER — DOCUSATE SODIUM 100 MG PO CAPS
200.0000 mg | ORAL_CAPSULE | Freq: Every day | ORAL | Status: DC
  Administered 2020-11-01 – 2020-11-03 (×3): 200 mg via ORAL
  Filled 2020-10-31 (×4): qty 2

## 2020-10-31 MED ORDER — ONDANSETRON HCL 4 MG/2ML IJ SOLN
4.0000 mg | Freq: Four times a day (QID) | INTRAMUSCULAR | Status: DC | PRN
  Administered 2020-10-31 – 2020-11-01 (×2): 4 mg via INTRAVENOUS
  Filled 2020-10-31 (×2): qty 2

## 2020-10-31 MED ORDER — PLATELET RICH PLASMA OPTIME
Status: DC | PRN
  Administered 2020-10-31: 10 mL

## 2020-10-31 MED ORDER — PLASMA-LYTE A IV SOLN
INTRAVENOUS | Status: DC | PRN
  Administered 2020-10-31: 1000 mL via INTRAVASCULAR

## 2020-10-31 MED ORDER — MAGNESIUM SULFATE 4 GM/100ML IV SOLN
4.0000 g | Freq: Once | INTRAVENOUS | Status: AC
  Administered 2020-10-31: 4 g via INTRAVENOUS
  Filled 2020-10-31: qty 100

## 2020-10-31 MED ORDER — SODIUM CHLORIDE 0.45 % IV SOLN: INTRAVENOUS | Status: DC | PRN

## 2020-10-31 MED ORDER — SODIUM CHLORIDE 0.9% FLUSH: 3.0000 mL | INTRAVENOUS | Status: DC | PRN

## 2020-10-31 MED ORDER — ASPIRIN 81 MG PO CHEW: 324.0000 mg | CHEWABLE_TABLET | Freq: Every day | ORAL | Status: DC

## 2020-10-31 MED ORDER — LACTATED RINGERS IV SOLN: INTRAVENOUS | Status: DC

## 2020-10-31 MED ORDER — SODIUM CHLORIDE 0.9% FLUSH
10.0000 mL | Freq: Two times a day (BID) | INTRAVENOUS | Status: DC
  Administered 2020-10-31 – 2020-11-03 (×6): 10 mL

## 2020-10-31 MED ORDER — ASPIRIN EC 325 MG PO TBEC
325.0000 mg | DELAYED_RELEASE_TABLET | Freq: Every day | ORAL | Status: DC
  Administered 2020-11-01 – 2020-11-04 (×4): 325 mg via ORAL
  Filled 2020-10-31 (×4): qty 1

## 2020-10-31 MED ORDER — BUPIVACAINE LIPOSOME 1.3 % IJ SUSP
INTRAMUSCULAR | Status: AC
  Filled 2020-10-31: qty 20

## 2020-10-31 MED ORDER — VANCOMYCIN HCL 1000 MG IV SOLR
INTRAVENOUS | Status: DC | PRN
  Administered 2020-10-31: 3 g

## 2020-10-31 MED ORDER — OXYCODONE HCL 5 MG PO TABS
5.0000 mg | ORAL_TABLET | ORAL | Status: DC | PRN
Start: 2020-10-31 — End: 2020-11-04
  Administered 2020-10-31 – 2020-11-03 (×11): 5 mg via ORAL
  Filled 2020-10-31 (×11): qty 1

## 2020-10-31 MED ORDER — HEMOSTATIC AGENTS (NO CHARGE) OPTIME
TOPICAL | Status: DC | PRN
  Administered 2020-10-31 (×3): 1 via TOPICAL

## 2020-10-31 MED ORDER — INSULIN REGULAR(HUMAN) IN NACL 100-0.9 UT/100ML-% IV SOLN
INTRAVENOUS | Status: DC
  Filled 2020-10-31: qty 100

## 2020-10-31 MED ORDER — DIPHENHYDRAMINE HCL 25 MG PO CAPS
75.0000 mg | ORAL_CAPSULE | Freq: Every day | ORAL | Status: DC
  Administered 2020-11-01 – 2020-11-03 (×3): 75 mg via ORAL
  Filled 2020-10-31 (×3): qty 3

## 2020-10-31 MED ORDER — STERILE WATER FOR INJECTION IV SOLN
INTRAVENOUS | Status: DC | PRN
  Administered 2020-10-31: 30 mL

## 2020-10-31 MED ORDER — METOPROLOL TARTRATE 12.5 MG HALF TABLET: 12.5000 mg | ORAL_TABLET | Freq: Once | ORAL | Status: DC

## 2020-10-31 MED ORDER — CHLORHEXIDINE GLUCONATE 0.12 % MT SOLN
15.0000 mL | OROMUCOSAL | Status: AC
  Administered 2020-10-31: 15 mL via OROMUCOSAL

## 2020-10-31 MED ORDER — SODIUM CHLORIDE 0.9 % IV SOLN: 250.0000 mL | INTRAVENOUS | Status: DC

## 2020-10-31 MED ORDER — THROMBIN 5000 UNITS EX SOLR
INTRAVENOUS | Status: DC | PRN
  Administered 2020-10-31: 1 mL

## 2020-10-31 MED ORDER — FENOFIBRATE 160 MG PO TABS
160.0000 mg | ORAL_TABLET | Freq: Every day | ORAL | Status: DC
  Administered 2020-11-01 – 2020-11-04 (×4): 160 mg via ORAL
  Filled 2020-10-31 (×4): qty 1

## 2020-10-31 MED ORDER — VANCOMYCIN HCL IN DEXTROSE 1-5 GM/200ML-% IV SOLN
1000.0000 mg | Freq: Once | INTRAVENOUS | Status: AC
  Administered 2020-10-31: 1000 mg via INTRAVENOUS
  Filled 2020-10-31: qty 200

## 2020-10-31 MED ORDER — PROPOFOL 10 MG/ML IV BOLUS
INTRAVENOUS | Status: DC | PRN
  Administered 2020-10-31: 20 mg via INTRAVENOUS

## 2020-10-31 MED ORDER — ALBUMIN HUMAN 5 % IV SOLN
250.0000 mL | INTRAVENOUS | Status: DC | PRN
  Administered 2020-10-31 – 2020-11-01 (×3): 12.5 g via INTRAVENOUS
  Filled 2020-10-31 (×2): qty 250

## 2020-10-31 MED ORDER — SODIUM CHLORIDE 0.9 % IV SOLN
38.0000 ug | Freq: Once | INTRAVENOUS | Status: AC
  Administered 2020-10-31: 38 ug via INTRAVENOUS
  Filled 2020-10-31 (×2): qty 9.5

## 2020-10-31 MED ORDER — EZETIMIBE 10 MG PO TABS
10.0000 mg | ORAL_TABLET | Freq: Every morning | ORAL | Status: DC
  Administered 2020-11-01 – 2020-11-04 (×4): 10 mg via ORAL
  Filled 2020-10-31 (×4): qty 1

## 2020-10-31 MED ORDER — ACETAMINOPHEN 160 MG/5ML PO SOLN: 650.0000 mg | Freq: Once | ORAL | Status: AC

## 2020-10-31 MED ORDER — DEXTROSE 50 % IV SOLN: 0.0000 mL | INTRAVENOUS | Status: DC | PRN

## 2020-10-31 MED ORDER — MIDAZOLAM HCL (PF) 5 MG/ML IJ SOLN
INTRAMUSCULAR | Status: DC | PRN
  Administered 2020-10-31 (×2): 1 mg via INTRAVENOUS

## 2020-10-31 MED ORDER — CHLORHEXIDINE GLUCONATE 0.12 % MT SOLN
15.0000 mL | Freq: Once | OROMUCOSAL | Status: AC
  Administered 2020-10-31: 15 mL via OROMUCOSAL

## 2020-10-31 MED ORDER — TRAMADOL HCL 50 MG PO TABS
50.0000 mg | ORAL_TABLET | ORAL | Status: DC | PRN
  Administered 2020-11-01 – 2020-11-02 (×5): 50 mg via ORAL
  Filled 2020-10-31 (×5): qty 1

## 2020-10-31 MED ORDER — CHLORHEXIDINE GLUCONATE CLOTH 2 % EX PADS
6.0000 | MEDICATED_PAD | Freq: Every day | CUTANEOUS | Status: DC
  Administered 2020-10-31 – 2020-11-02 (×3): 6 via TOPICAL

## 2020-10-31 MED ORDER — MORPHINE SULFATE (PF) 2 MG/ML IV SOLN: 1.0000 mg | INTRAVENOUS | Status: DC | PRN

## 2020-10-31 MED ORDER — ARTIFICIAL TEARS OPHTHALMIC OINT
TOPICAL_OINTMENT | OPHTHALMIC | Status: DC | PRN
  Administered 2020-10-31: 1 via OPHTHALMIC

## 2020-10-31 MED ORDER — ORAL CARE MOUTH RINSE: 15.0000 mL | Freq: Once | OROMUCOSAL | Status: DC

## 2020-10-31 MED ORDER — NITROGLYCERIN IN D5W 200-5 MCG/ML-% IV SOLN
0.0000 ug/min | INTRAVENOUS | Status: DC
Start: 2020-10-31 — End: 2020-11-02

## 2020-10-31 MED ORDER — PHENYLEPHRINE 40 MCG/ML (10ML) SYRINGE FOR IV PUSH (FOR BLOOD PRESSURE SUPPORT)
PREFILLED_SYRINGE | INTRAVENOUS | Status: DC | PRN
  Administered 2020-10-31 (×2): 80 ug via INTRAVENOUS

## 2020-10-31 MED ORDER — PHENYLEPHRINE HCL-NACL 20-0.9 MG/250ML-% IV SOLN
0.0000 ug/min | INTRAVENOUS | Status: DC
  Administered 2020-10-31: 20 ug/min via INTRAVENOUS
  Administered 2020-11-01: 25 ug/min via INTRAVENOUS
  Filled 2020-10-31 (×3): qty 250

## 2020-10-31 MED ORDER — FENTANYL CITRATE (PF) 100 MCG/2ML IJ SOLN
INTRAMUSCULAR | Status: DC | PRN
  Administered 2020-10-31: 250 ug via INTRAVENOUS
  Administered 2020-10-31: 700 ug via INTRAVENOUS
  Administered 2020-10-31 (×2): 50 ug via INTRAVENOUS

## 2020-10-31 MED ORDER — ACETAMINOPHEN 500 MG PO TABS
1000.0000 mg | ORAL_TABLET | Freq: Four times a day (QID) | ORAL | Status: DC
  Administered 2020-10-31 – 2020-11-04 (×14): 1000 mg via ORAL
  Filled 2020-10-31 (×14): qty 2

## 2020-10-31 MED ORDER — 0.9 % SODIUM CHLORIDE (POUR BTL) OPTIME
TOPICAL | Status: DC | PRN
  Administered 2020-10-31: 5000 mL

## 2020-10-31 MED ORDER — SODIUM CHLORIDE 0.9% FLUSH: 10.0000 mL | INTRAVENOUS | Status: DC | PRN

## 2020-10-31 MED ORDER — SODIUM CHLORIDE 0.9% FLUSH
3.0000 mL | Freq: Two times a day (BID) | INTRAVENOUS | Status: DC
  Administered 2020-11-01 – 2020-11-03 (×5): 3 mL via INTRAVENOUS

## 2020-10-31 MED ORDER — BUPIVACAINE LIPOSOME 1.3 % IJ SUSP
INTRAMUSCULAR | Status: DC | PRN
  Administered 2020-10-31: 50 mL

## 2020-10-31 MED ORDER — POTASSIUM CHLORIDE 10 MEQ/50ML IV SOLN: 10.0000 meq | INTRAVENOUS | Status: AC

## 2020-10-31 MED ORDER — MIDAZOLAM HCL 2 MG/2ML IJ SOLN: 2.0000 mg | INTRAMUSCULAR | Status: DC | PRN

## 2020-10-31 MED ORDER — ACETAMINOPHEN 650 MG RE SUPP
650.0000 mg | Freq: Once | RECTAL | Status: AC
  Administered 2020-10-31: 650 mg via RECTAL

## 2020-10-31 MED ORDER — CHLORHEXIDINE GLUCONATE 0.12 % MT SOLN
OROMUCOSAL | Status: AC
  Filled 2020-10-31: qty 15

## 2020-10-31 MED ORDER — STERILE WATER FOR IRRIGATION IR SOLN
Status: DC | PRN
  Administered 2020-10-31: 2000 mL

## 2020-10-31 MED ORDER — ATORVASTATIN CALCIUM 80 MG PO TABS
80.0000 mg | ORAL_TABLET | Freq: Every morning | ORAL | Status: DC
  Administered 2020-11-01 – 2020-11-04 (×4): 80 mg via ORAL
  Filled 2020-10-31 (×4): qty 1

## 2020-10-31 MED ORDER — ~~LOC~~ CARDIAC SURGERY, PATIENT & FAMILY EDUCATION
Freq: Once | Status: DC
Start: 2020-10-31 — End: 2020-10-31
  Filled 2020-10-31: qty 1

## 2020-10-31 MED ORDER — METOPROLOL TARTRATE 5 MG/5ML IV SOLN: 2.5000 mg | INTRAVENOUS | Status: DC | PRN

## 2020-10-31 MED ORDER — DEXMEDETOMIDINE HCL IN NACL 400 MCG/100ML IV SOLN
0.0000 ug/kg/h | INTRAVENOUS | Status: DC
  Administered 2020-10-31: 0.7 ug/kg/h via INTRAVENOUS
  Filled 2020-10-31: qty 100

## 2020-10-31 MED ORDER — FENTANYL CITRATE (PF) 250 MCG/5ML IJ SOLN
INTRAMUSCULAR | Status: AC
  Filled 2020-10-31: qty 25

## 2020-10-31 MED ORDER — BISACODYL 10 MG RE SUPP: 10.0000 mg | Freq: Every day | RECTAL | Status: DC

## 2020-10-31 MED ORDER — METOPROLOL TARTRATE 25 MG/10 ML ORAL SUSPENSION: 12.5000 mg | Freq: Two times a day (BID) | ORAL | Status: DC

## 2020-10-31 MED ORDER — ROCURONIUM BROMIDE 10 MG/ML (PF) SYRINGE
PREFILLED_SYRINGE | INTRAVENOUS | Status: DC | PRN
  Administered 2020-10-31 (×3): 50 mg via INTRAVENOUS
  Administered 2020-10-31: 100 mg via INTRAVENOUS

## 2020-10-31 MED ORDER — VANCOMYCIN HCL 1000 MG IV SOLR
INTRAVENOUS | Status: AC
  Filled 2020-10-31: qty 3000

## 2020-10-31 MED ORDER — FAMOTIDINE IN NACL 20-0.9 MG/50ML-% IV SOLN
20.0000 mg | Freq: Two times a day (BID) | INTRAVENOUS | Status: DC
  Administered 2020-10-31: 20 mg via INTRAVENOUS
  Filled 2020-10-31 (×2): qty 50

## 2020-10-31 MED ORDER — POVIDONE-IODINE 10 % EX SOLN
CUTANEOUS | Status: DC | PRN
  Administered 2020-10-31: 1 via TOPICAL

## 2020-10-31 MED ORDER — SODIUM CHLORIDE (PF) 0.9 % IJ SOLN
OROMUCOSAL | Status: DC | PRN
  Administered 2020-10-31 (×2): 4 mL via TOPICAL

## 2020-10-31 MED ORDER — HEPARIN SODIUM (PORCINE) 1000 UNIT/ML IJ SOLN
INTRAMUSCULAR | Status: DC | PRN
  Administered 2020-10-31: 30000 [IU] via INTRAVENOUS

## 2020-10-31 MED ORDER — CHLORHEXIDINE GLUCONATE 0.12 % MT SOLN: 15.0000 mL | Freq: Once | OROMUCOSAL | Status: DC

## 2020-10-31 MED ORDER — NITROGLYCERIN IN D5W 200-5 MCG/ML-% IV SOLN
INTRAVENOUS | Status: DC | PRN
  Administered 2020-10-31: 10 ug/min via INTRAVENOUS

## 2020-10-31 MED ORDER — POVIDONE-IODINE 7.5 % EX SOLN
CUTANEOUS | Status: DC | PRN
  Administered 2020-10-31: 1 via TOPICAL

## 2020-10-31 MED ORDER — METOPROLOL TARTRATE 12.5 MG HALF TABLET: 12.5000 mg | ORAL_TABLET | Freq: Two times a day (BID) | ORAL | Status: DC

## 2020-10-31 MED ORDER — BISACODYL 5 MG PO TBEC
10.0000 mg | DELAYED_RELEASE_TABLET | Freq: Every day | ORAL | Status: DC
  Administered 2020-11-01 – 2020-11-02 (×2): 10 mg via ORAL
  Filled 2020-10-31 (×3): qty 2

## 2020-10-31 MED ORDER — PANTOPRAZOLE SODIUM 40 MG PO TBEC
40.0000 mg | DELAYED_RELEASE_TABLET | Freq: Every day | ORAL | Status: DC
  Administered 2020-11-02 – 2020-11-04 (×3): 40 mg via ORAL
  Filled 2020-10-31 (×3): qty 1

## 2020-10-31 MED ORDER — PROTAMINE SULFATE 10 MG/ML IV SOLN
INTRAVENOUS | Status: DC | PRN
  Administered 2020-10-31: 290 mg via INTRAVENOUS
  Administered 2020-10-31: 10 mg via INTRAVENOUS

## 2020-10-31 MED ORDER — EPINEPHRINE HCL 5 MG/250ML IV SOLN IN NS: 0.0000 ug/min | INTRAVENOUS | Status: DC

## 2020-10-31 MED ORDER — STERILE WATER FOR INJECTION IJ SOLN
INTRAMUSCULAR | Status: DC | PRN
  Administered 2020-10-31: 10 mL via INTRACORONARY

## 2020-10-31 MED ORDER — BUPIVACAINE HCL (PF) 0.5 % IJ SOLN
INTRAMUSCULAR | Status: AC
  Filled 2020-10-31: qty 30

## 2020-10-31 MED FILL — Epinephrine Inj 30 MG/30ML (1 MG/ML) (1:1000): INTRAMUSCULAR | Qty: 5 | Status: AC

## 2020-10-31 MED FILL — Sodium Chloride IV Soln 0.9%: INTRAVENOUS | Qty: 250 | Status: AC

## 2020-10-31 SURGICAL SUPPLY — 111 items
ADAPTER CARDIO PERF ANTE/RETRO (ADAPTER) ×5 IMPLANT
APPLICATOR TIP COSEAL (VASCULAR PRODUCTS) IMPLANT
APPLIER CLIP 9.375 SM OPEN (CLIP)
BAG DECANTER FOR FLEXI CONT (MISCELLANEOUS) ×5 IMPLANT
BLADE CLIPPER SURG (BLADE) ×5 IMPLANT
BLADE STERNUM SYSTEM 6 (BLADE) ×5 IMPLANT
BLADE SURG 15 STRL LF DISP TIS (BLADE) ×4 IMPLANT
BLADE SURG 15 STRL SS (BLADE) ×1
BLADE SURG SZ11 CARB STEEL (BLADE) ×5 IMPLANT
BNDG ELASTIC 4X5.8 VLCR STR LF (GAUZE/BANDAGES/DRESSINGS) ×5 IMPLANT
BNDG ELASTIC 6X5.8 VLCR STR LF (GAUZE/BANDAGES/DRESSINGS) ×5 IMPLANT
BNDG GAUZE ELAST 4 BULKY (GAUZE/BANDAGES/DRESSINGS) ×5 IMPLANT
CABLE SAFETY PACEMAKER 12 SU (CABLE) ×5 IMPLANT
CANISTER SUCT 3000ML PPV (MISCELLANEOUS) ×5 IMPLANT
CANNULA NON VENT 20FR 12 (CANNULA) ×5 IMPLANT
CATH CPB KIT HENDRICKSON (MISCELLANEOUS) ×5 IMPLANT
CATH RETROPLEGIA CORONARY 14FR (CATHETERS) ×5 IMPLANT
CATH ROBINSON RED A/P 18FR (CATHETERS) ×5 IMPLANT
CLIP APPLIE 9.375 SM OPEN (CLIP) IMPLANT
CLIP RETRACTION 3.0MM CORONARY (MISCELLANEOUS) ×5 IMPLANT
CLIP VESOCCLUDE MED 24/CT (CLIP) IMPLANT
CLIP VESOCCLUDE SM WIDE 24/CT (CLIP) ×15 IMPLANT
CONTAINER PROTECT SURGISLUSH (MISCELLANEOUS) ×10 IMPLANT
COVER MAYO STAND STRL (DRAPES) ×5 IMPLANT
CUFF TOURN SGL QUICK 18X4 (TOURNIQUET CUFF) IMPLANT
CUFF TOURN SGL QUICK 24 (TOURNIQUET CUFF)
CUFF TRNQT CYL 24X4X16.5-23 (TOURNIQUET CUFF) IMPLANT
DERMABOND ADVANCED (GAUZE/BANDAGES/DRESSINGS) ×1
DERMABOND ADVANCED .7 DNX12 (GAUZE/BANDAGES/DRESSINGS) ×4 IMPLANT
DRAIN CHANNEL 28F RND 3/8 FF (WOUND CARE) ×15 IMPLANT
DRAPE CARDIOVASCULAR INCISE (DRAPES) ×1
DRAPE EXTREMITY T 121X128X90 (DISPOSABLE) IMPLANT
DRAPE HALF SHEET 40X57 (DRAPES) ×5 IMPLANT
DRAPE SRG 135X102X78XABS (DRAPES) ×4 IMPLANT
DRAPE WARM FLUID 44X44 (DRAPES) ×5 IMPLANT
DRSG AQUACEL AG ADV 3.5X14 (GAUZE/BANDAGES/DRESSINGS) ×5 IMPLANT
ELECT CAUTERY BLADE 6.4 (BLADE) ×5 IMPLANT
ELECT REM PT RETURN 9FT ADLT (ELECTROSURGICAL) ×10
ELECTRODE REM PT RTRN 9FT ADLT (ELECTROSURGICAL) ×8 IMPLANT
FELT TEFLON 1X6 (MISCELLANEOUS) ×5 IMPLANT
GAUZE SPONGE 4X4 12PLY STRL (GAUZE/BANDAGES/DRESSINGS) ×5 IMPLANT
GAUZE SPONGE 4X4 12PLY STRL LF (GAUZE/BANDAGES/DRESSINGS) ×5 IMPLANT
GEL ULTRASOUND 20GR AQUASONIC (MISCELLANEOUS) IMPLANT
GLOVE NEODERM STRL 7.5 LF PF (GLOVE) ×12 IMPLANT
GLOVE SURG MICRO LTX SZ6 (GLOVE) ×15 IMPLANT
GLOVE SURG MICRO LTX SZ6.5 (GLOVE) ×15 IMPLANT
GLOVE SURG NEODERM 7.5  LF PF (GLOVE) ×3
GLOVE SURG POLYISO LF SZ6.5 (GLOVE) ×20 IMPLANT
GLOVE SURG UNDER POLY LF SZ6 (GLOVE) ×15 IMPLANT
GLOVE SURG UNDER POLY LF SZ9 (GLOVE) ×5 IMPLANT
GOWN STRL REUS W/ TWL LRG LVL3 (GOWN DISPOSABLE) ×32 IMPLANT
GOWN STRL REUS W/TWL LRG LVL3 (GOWN DISPOSABLE) ×8
HEMOSTAT POWDER SURGIFOAM 1G (HEMOSTASIS) ×10 IMPLANT
INSERT FOGARTY XLG (MISCELLANEOUS) ×5 IMPLANT
INSERT SUTURE HOLDER (MISCELLANEOUS) ×5 IMPLANT
IV ADAPTER SYR DOUBLE MALE LL (MISCELLANEOUS) ×5 IMPLANT
KIT APPLICATOR RATIO 11:1 (KITS) ×5 IMPLANT
KIT BASIN OR (CUSTOM PROCEDURE TRAY) ×5 IMPLANT
KIT SUCTION CATH 14FR (SUCTIONS) ×5 IMPLANT
KIT TURNOVER KIT B (KITS) ×5 IMPLANT
KIT VASOVIEW HEMOPRO 2 VH 4000 (KITS) ×5 IMPLANT
MARKER GRAFT CORONARY BYPASS (MISCELLANEOUS) IMPLANT
NEEDLE 18GX1X1/2 (RX/OR ONLY) (NEEDLE) ×5 IMPLANT
NS IRRIG 1000ML POUR BTL (IV SOLUTION) ×25 IMPLANT
PACK E OPEN HEART (SUTURE) ×5 IMPLANT
PACK OPEN HEART (CUSTOM PROCEDURE TRAY) ×5 IMPLANT
PACK PLATELET PROCEDURE 60 (MISCELLANEOUS) ×5 IMPLANT
PACK SPY-PHI (KITS) ×5 IMPLANT
PAD ARMBOARD 7.5X6 YLW CONV (MISCELLANEOUS) ×5 IMPLANT
PAD ELECT DEFIB RADIOL ZOLL (MISCELLANEOUS) ×5 IMPLANT
PENCIL BUTTON HOLSTER BLD 10FT (ELECTRODE) ×5 IMPLANT
POSITIONER HEAD DONUT 9IN (MISCELLANEOUS) ×5 IMPLANT
POWDER SURGICEL 3.0 GRAM (HEMOSTASIS) ×10 IMPLANT
SEALANT SURG COSEAL 4ML (VASCULAR PRODUCTS) ×5 IMPLANT
SEALANT SURG COSEAL 8ML (VASCULAR PRODUCTS) IMPLANT
SET MPS 3-ND DEL (MISCELLANEOUS) ×5 IMPLANT
SHEARS HARMONIC 9CM CVD (BLADE) IMPLANT
SUT BONE WAX W31G (SUTURE) ×5 IMPLANT
SUT MNCRL AB 3-0 PS2 18 (SUTURE) ×10 IMPLANT
SUT MNCRL AB 4-0 PS2 18 (SUTURE) ×5 IMPLANT
SUT PDS AB 1 CTX 36 (SUTURE) ×10 IMPLANT
SUT PROLENE 3 0 SH DA (SUTURE) ×10 IMPLANT
SUT PROLENE 5 0 C 1 36 (SUTURE) IMPLANT
SUT PROLENE 6 0 C 1 30 (SUTURE) ×15 IMPLANT
SUT PROLENE 7 0 BV 1 (SUTURE) ×10 IMPLANT
SUT PROLENE 8 0 BV175 6 (SUTURE) IMPLANT
SUT PROLENE BLUE 7 0 (SUTURE) ×5 IMPLANT
SUT SILK 2 0 SH CR/8 (SUTURE) IMPLANT
SUT SILK 3 0 SH CR/8 (SUTURE) IMPLANT
SUT STEEL 6MS V (SUTURE) ×5 IMPLANT
SUT STEEL SZ 6 DBL 3X14 BALL (SUTURE) ×5 IMPLANT
SUT VIC AB 2-0 CT1 27 (SUTURE) ×1
SUT VIC AB 2-0 CT1 TAPERPNT 27 (SUTURE) ×4 IMPLANT
SUT VIC AB 2-0 CTX 27 (SUTURE) IMPLANT
SUT VIC AB 3-0 SH 27 (SUTURE)
SUT VIC AB 3-0 SH 27X BRD (SUTURE) IMPLANT
SUT VIC AB 3-0 X1 27 (SUTURE) IMPLANT
SYR 10ML LL (SYRINGE) ×5 IMPLANT
SYR 30ML LL (SYRINGE) IMPLANT
SYR 3ML LL SCALE MARK (SYRINGE) IMPLANT
SYR 50ML SLIP (SYRINGE) IMPLANT
SYSTEM SAHARA CHEST DRAIN ATS (WOUND CARE) ×5 IMPLANT
TIP DUAL SPRAY TOPICAL (TIP) ×10 IMPLANT
TOWEL GREEN STERILE (TOWEL DISPOSABLE) ×5 IMPLANT
TOWEL GREEN STERILE FF (TOWEL DISPOSABLE) ×5 IMPLANT
TRAY FOLEY SLVR 16FR TEMP STAT (SET/KITS/TRAYS/PACK) ×5 IMPLANT
TUBING ART PRESS 48 MALE/FEM (TUBING) ×10 IMPLANT
TUBING LAP HI FLOW INSUFFLATIO (TUBING) ×5 IMPLANT
UNDERPAD 30X36 HEAVY ABSORB (UNDERPADS AND DIAPERS) IMPLANT
WATER STERILE IRR 1000ML POUR (IV SOLUTION) ×10 IMPLANT
WATER STERILE IRR 1000ML UROMA (IV SOLUTION) IMPLANT

## 2020-10-31 NOTE — Progress Notes (Signed)
[facility] BRIEF SICU PROGRESS NOTE  Day of Surgery  S/P Procedure(s) (LRB): CORONARY ARTERY BYPASS GRAFTING (CABG)x 4 ON CARDIOPULMONARY BYPASS. LEFT INTERNAL MAMMARY ARTERY AND RIGHT GREATER SAPHENOUS VEIN. LIMA TO LAD, SVG TO DIAG., SVG TO PDA  AND PLB (N/A) TRANSESOPHAGEAL ECHOCARDIOGRAM (TEE) (N/A) INDOCYANINE GREEN FLUORESCENCE IMAGING (ICG) (N/A) ENDOVEIN HARVEST OF GREATER SAPHENOUS VEIN (Right) APPLICATION OF CELL SAVER (N/A)   Extubated uneventfully AV paced w/ stable hemodynamics on low dose milrinone and Neo Breathing comfortably w/ O2 sats 100% Chest tube output low UOP adequate Labs okay  Plan: Continue routine early postop  Purcell Nails, MD 10/31/2020 6:04 PM

## 2020-10-31 NOTE — Transfer of Care (Signed)
Immediate Anesthesia Transfer of Care Note  Patient: Ronald Roth  Procedure(s) Performed: CORONARY ARTERY BYPASS GRAFTING (CABG)x 4 ON CARDIOPULMONARY BYPASS. LEFT INTERNAL MAMMARY ARTERY AND RIGHT GREATER SAPHENOUS VEIN. LIMA TO LAD, SVG TO DIAG., SVG TO PDA  AND PLB (Chest) TRANSESOPHAGEAL ECHOCARDIOGRAM (TEE) INDOCYANINE GREEN FLUORESCENCE IMAGING (ICG) ENDOVEIN HARVEST OF GREATER SAPHENOUS VEIN (Right) APPLICATION OF CELL SAVER  Patient Location: SICU  Anesthesia Type:General  Level of Consciousness: Patient remains intubated per anesthesia plan  Airway & Oxygen Therapy: Patient remains intubated per anesthesia plan and Patient placed on Ventilator (see vital sign flow sheet for setting)  Post-op Assessment: Report given to RN and Post -op Vital signs reviewed and stable  Post vital signs: Reviewed and stable  Last Vitals:  Vitals Value Taken Time  BP 99/59 10/31/20 1248  Temp    Pulse 91 10/31/20 1253  Resp 14 10/31/20 1253  SpO2 97 % 10/31/20 1253  Vitals shown include unvalidated device data.  Last Pain:  Vitals:   10/31/20 0606  TempSrc: Oral  PainSc:          Complications: No notable events documented.

## 2020-10-31 NOTE — Procedures (Signed)
Extubation Procedure Note  Patient Details:   Name: Ronald Roth DOB: June 04, 1950 MRN: [id number]   Airway Documentation:   + cuff leak test prior to extubation.  VC , NIF -30 Vent end date: 10/31/20 Vent end time: 1745   Evaluation  O2 sats: stable throughout Complications: No apparent complications Patient did tolerate procedure well. Bilateral Breath Sounds: Clear, Diminished   Yes pt able to speak.  No stridor noted, no distress noted.    Jennette Kettle 10/31/2020, 5:56 PM

## 2020-10-31 NOTE — Anesthesia Procedure Notes (Signed)
Central Venous Catheter Insertion Performed by: Catalina Gravel, MD, anesthesiologist Start/End6/17/2022 6:45 AM, 10/31/2020 6:55 AM Patient location: Pre-op. Preanesthetic checklist: patient identified, IV checked, site marked, risks and benefits discussed, surgical consent, monitors and equipment checked, pre-op evaluation, timeout performed and anesthesia consent Position: Trendelenburg Lidocaine 1% used for infiltration and patient sedated Hand hygiene performed , maximum sterile barriers used  and Seldinger technique used Catheter size: 9 Fr Central line was placed.MAC introducer Procedure performed using ultrasound guided technique. Ultrasound Notes:anatomy identified, needle tip was noted to be adjacent to the nerve/plexus identified, no ultrasound evidence of intravascular and/or intraneural injection and image(s) printed for medical record Attempts: 1 Following insertion, line sutured, dressing applied and Biopatch. Post procedure assessment: free fluid flow, blood return through all ports and no air  Patient tolerated the procedure well with no immediate complications.

## 2020-10-31 NOTE — Anesthesia Procedure Notes (Signed)
Arterial Line Insertion Start/End6/17/2022 6:55 AM, 10/31/2020 7:00 AM  Patient location: Pre-op. Preanesthetic checklist: patient identified, IV checked, site marked, risks and benefits discussed, surgical consent, monitors and equipment checked, pre-op evaluation, timeout performed and anesthesia consent Lidocaine 1% used for infiltration Right, radial was placed Catheter size: 20 Fr Hand hygiene performed  and maximum sterile barriers used   Attempts: 1 Procedure performed without using ultrasound guided technique. Following insertion, dressing applied and Biopatch. Post procedure assessment: normal and unchanged  Patient tolerated the procedure well with no immediate complications.

## 2020-10-31 NOTE — Discharge Instructions (Addendum)
Discharge Instructions:  1. You may shower, please wash incisions daily with soap and water and keep dry.  If you wish to cover wounds with dressing you may do so but please keep clean and change daily.  No tub baths or swimming until incisions have completely healed.  If your incisions become red or develop any drainage please call our office at [phone]  2. No Driving until cleared by Dr. Atkins office and you are no longer using narcotic pain medications  3. Monitor your weight daily.. Please use the same scale and weigh at same time... If you gain 3-5 lbs in 48 hours with associated lower extremity swelling, please contact our office at [phone]  4. Fever of 101.5 for at least 24 hours with no source, please contact our office at [phone]  5. Activity- up as tolerated, please walk at least 3 times per day.  Avoid strenuous activity, no lifting, pushing, or pulling with your arms over 8-10 lbs for a minimum of 6 weeks  6. If any questions or concerns arise, please do not hesitate to contact our office at [phone] 

## 2020-10-31 NOTE — Brief Op Note (Signed)
10/31/2020  11:01 AM  PATIENT:  Ronald Roth  70 y.o. male  PRE-OPERATIVE DIAGNOSIS:  CAD  POST-OPERATIVE DIAGNOSIS:  CAD  PROCEDURE:  TRANSESOPHAGEAL ECHOCARDIOGRAM (TEE), CORONARY ARTERY BYPASS GRAFTING (CABG)x 4 ON CARDIOPULMONARY BYPASS (LIMA to LAD, SVG to DIAGONAL, SVG to PDA and PLB), LEFT INTERNAL MAMMARY ARTERY AND EVH of RIGHT GREATER SAPHENOUS VEIN, INDOCYANINE GREEN FLUORESCENCE IMAGING (ICG)   RIGHT EVH HARVEST TIME: 20 minutes; RIGHT EVH PREP TIME: 14 minutes   SURGEON:  Surgeon(s) and Role:    Linden Dolin, MD - Primary  PHYSICIAN ASSISTANT: Doree Fudge PA-C  ASSISTANTS: Katherene Ponto RNFA   ANESTHESIA:   general  EBL:  Per anesthesia, perfusion record  DRAINS:  Chest tubes placed in the mediastinal and pleural spaces    LOCAL MEDICATIONS USED:  OTHER Exparel  COUNTS CORRECT:  YES  DICTATION: .Dragon Dictation  PLAN OF CARE: Admit to inpatient   PATIENT DISPOSITION:  ICU - intubated and hemodynamically stable.   Delay start of Pharmacological VTE agent (>24hrs) due to surgical blood loss or risk of bleeding: yes  BASELINE WEIGHT: 95.6 kg

## 2020-10-31 NOTE — Progress Notes (Signed)
*  PRELIMINARY RESULTS* Echocardiogram Echocardiogram Transesophageal has been performed.  Delcie Roch 10/31/2020, 9:07 AM

## 2020-10-31 NOTE — H&P (Signed)
History and Physical Interval Note:  10/31/2020 7:19 AM  Ronald Roth  has presented today for surgery, with the diagnosis of CAD.  The various methods of treatment have been discussed with the patient and family. After consideration of risks, benefits and other options for treatment, the patient has consented to  Procedure(s): CORONARY ARTERY BYPASS GRAFTING (CABG) (N/A) possible RADIAL ARTERY HARVEST (Left) TRANSESOPHAGEAL ECHOCARDIOGRAM (TEE) (N/A) INDOCYANINE GREEN FLUORESCENCE IMAGING (ICG) (N/A) as a surgical intervention.  The patient's history has been reviewed, patient examined, no change in status, stable for surgery.  I have reviewed the patient's chart and labs.  Questions were answered to the patient's satisfaction.     Linden Dolin

## 2020-10-31 NOTE — Anesthesia Postprocedure Evaluation (Signed)
Anesthesia Post Note  Patient: Ronald Roth  Procedure(s) Performed: CORONARY ARTERY BYPASS GRAFTING (CABG)x 4 ON CARDIOPULMONARY BYPASS. LEFT INTERNAL MAMMARY ARTERY AND RIGHT GREATER SAPHENOUS VEIN. LIMA TO LAD, SVG TO DIAG., SVG TO PDA  AND PLB (Chest) TRANSESOPHAGEAL ECHOCARDIOGRAM (TEE) INDOCYANINE GREEN FLUORESCENCE IMAGING (ICG) ENDOVEIN HARVEST OF GREATER SAPHENOUS VEIN (Right) APPLICATION OF CELL SAVER     Patient location during evaluation: SICU Anesthesia Type: General Level of consciousness: sedated and patient remains intubated per anesthesia plan Pain management: pain level controlled Vital Signs Assessment: post-procedure vital signs reviewed and stable Respiratory status: patient on ventilator - see flowsheet for VS and patient remains intubated per anesthesia plan Cardiovascular status: stable (remains on milrinone and Phenylephrine) Postop Assessment: no apparent nausea or vomiting Anesthetic complications: no   No notable events documented.  Last Vitals:  Vitals:   10/31/20 0713 10/31/20 1242  BP: 120/71 (!) 107/54  Pulse: 72 92  Resp: 18 12  Temp:    SpO2: 100% 97%    Last Pain:  Vitals:   10/31/20 0606  TempSrc: Oral  PainSc:                  Maleke Feria,E. Zyire Eidson

## 2020-10-31 NOTE — Hospital Course (Addendum)
HPI: 70 year old man is referred for consideration of CABG.  He is a 10-year history of coronary artery disease status post myocardial infarction 10 years ago at which point he underwent PCI.  He retired shortly thereafter.  His symptoms were relatively stable until this past spring when he noticed fatigue and shortness of breath with walking up inclines.  He also notes generalized fatigue.  Occasionally, in retrospect, he experienced mild chest pain and shortness of breath with other less strenuous activities.  He presented with the symptoms to his physician who ordered a coronary CT angiogram which demonstrated severe three-vessel disease.  This was confirmed by left heart catheterization.  In addition, he was found to have a globally reduced LV function with EF in the 25 to 30% range.  He denies orthopnea PND or significant peripheral edema.  Other coronary risk factors include diabetes and hypertension.  He also has a strong family history for coronary artery disease. Dr. Vickey Sages discussed the need for coronary artery bypass grafting surgery.Potential risks, benefits, and complications of the surgery were discussed with the patient and he agreed to proceed with surgery. Pre operative carotid duplex showed a 40-59% right internal carotid artery stenosis and no significant left internal carotid artery stenosis. Patient underwent a CABG x 4 on 10/31/2020.  Hospital Course: Patient was transferred in stable condition from the OR to [facility] ICU following surgery. He was extubated in a timely manner. POD 1 she was maintaining NSR and was hemodynamically stable. He remained on milrinone and Neo drips. We discontinued his lines and were weaning milrinone slowly. We began a diuretic regimen for fluid overload. POD 2 we discontinued his chest tubes and he was deemed stable to be transferred to the step down unit for continued care. POD 3 we increased his BB for better BP and HR control. He was on room air with good oxygen  saturation. He was encouraged to ambulate multiple times a day and his pain was well controlled.

## 2020-10-31 NOTE — Anesthesia Procedure Notes (Signed)
Central Venous Catheter Insertion Performed by: Cecile Hearing, MD, anesthesiologist Start/End6/17/2022 6:55 AM, 10/31/2020 7:00 AM Patient location: Pre-op. Preanesthetic checklist: patient identified, IV checked, site marked, risks and benefits discussed, surgical consent, monitors and equipment checked, pre-op evaluation and timeout performed Position: Trendelenburg Hand hygiene performed  and maximum sterile barriers used  Total catheter length 100. PA cath was placed.Swan type:thermodilution PA Cath depth:48 Procedure performed without using ultrasound guided technique. Attempts: 1 Patient tolerated the procedure well with no immediate complications.

## 2020-10-31 NOTE — Anesthesia Procedure Notes (Signed)
Procedure Name: Intubation Date/Time: 10/31/2020 8:02 AM Performed by: Thelma Comp, CRNA Pre-anesthesia Checklist: Patient identified, Emergency Drugs available, Suction available and Patient being monitored Patient Re-evaluated:Patient Re-evaluated prior to induction Oxygen Delivery Method: Circle System Utilized Preoxygenation: Pre-oxygenation with 100% oxygen Induction Type: IV induction Ventilation: Mask ventilation without difficulty Laryngoscope Size: Mac and 4 Grade View: Grade I Tube type: Oral Tube size: 8.0 mm Number of attempts: 1 Airway Equipment and Method: Stylet and Oral airway Placement Confirmation: ETT inserted through vocal cords under direct vision, positive ETCO2 and breath sounds checked- equal and bilateral Secured at: 23 cm Tube secured with: Tape Dental Injury: Teeth and Oropharynx as per pre-operative assessment

## 2020-11-01 ENCOUNTER — Inpatient Hospital Stay (HOSPITAL_COMMUNITY): Payer: Managed Care, Other (non HMO)

## 2020-11-01 LAB — CBC
HCT: 35.6 % — ABNORMAL LOW (ref 39.0–52.0)
HCT: 33.6 % — ABNORMAL LOW (ref 39.0–52.0)
Hemoglobin: 11.3 g/dL — ABNORMAL LOW (ref 13.0–17.0)
Hemoglobin: 10.7 g/dL — ABNORMAL LOW (ref 13.0–17.0)
MCH: 30.7 pg (ref 26.0–34.0)
MCH: 30.9 pg (ref 26.0–34.0)
MCHC: 31.7 g/dL (ref 30.0–36.0)
MCHC: 31.8 g/dL (ref 30.0–36.0)
MCV: 96.7 fL (ref 80.0–100.0)
MCV: 97.1 fL (ref 80.0–100.0)
Platelets: 191 10*3/uL (ref 150–400)
Platelets: 152 10*3/uL (ref 150–400)
RBC: 3.68 MIL/uL — ABNORMAL LOW (ref 4.22–5.81)
RBC: 3.46 MIL/uL — ABNORMAL LOW (ref 4.22–5.81)
RDW: 14.5 % (ref 11.5–15.5)
RDW: 14.7 % (ref 11.5–15.5)
WBC: 13.5 10*3/uL — ABNORMAL HIGH (ref 4.0–10.5)
WBC: 10.6 10*3/uL — ABNORMAL HIGH (ref 4.0–10.5)
nRBC: 0 % (ref 0.0–0.2)
nRBC: 0 % (ref 0.0–0.2)

## 2020-11-01 LAB — BASIC METABOLIC PANEL
Anion gap: 10 (ref 5–15)
Anion gap: 7 (ref 5–15)
BUN: 14 mg/dL (ref 8–23)
BUN: 15 mg/dL (ref 8–23)
CO2: 20 mmol/L — ABNORMAL LOW (ref 22–32)
CO2: 22 mmol/L (ref 22–32)
Calcium: 8.5 mg/dL — ABNORMAL LOW (ref 8.9–10.3)
Calcium: 8.3 mg/dL — ABNORMAL LOW (ref 8.9–10.3)
Chloride: 107 mmol/L (ref 98–111)
Chloride: 104 mmol/L (ref 98–111)
Creatinine, Ser: 1.34 mg/dL — ABNORMAL HIGH (ref 0.61–1.24)
Creatinine, Ser: 1.2 mg/dL (ref 0.61–1.24)
GFR, Estimated: 57 mL/min — ABNORMAL LOW (ref >60)
GFR, Estimated: >60 mL/min (ref >60)
Glucose, Bld: 115 mg/dL — ABNORMAL HIGH (ref 70–99)
Glucose, Bld: 225 mg/dL — ABNORMAL HIGH (ref 70–99)
Potassium: 4.3 mmol/L (ref 3.5–5.1)
Potassium: 4.3 mmol/L (ref 3.5–5.1)
Sodium: 137 mmol/L (ref 135–145)
Sodium: 133 mmol/L — ABNORMAL LOW (ref 135–145)

## 2020-11-01 LAB — GLUCOSE, CAPILLARY
Glucose-Capillary: 104 mg/dL — ABNORMAL HIGH (ref 70–99)
Glucose-Capillary: 108 mg/dL — ABNORMAL HIGH (ref 70–99)
Glucose-Capillary: 110 mg/dL — ABNORMAL HIGH (ref 70–99)
Glucose-Capillary: 116 mg/dL — ABNORMAL HIGH (ref 70–99)
Glucose-Capillary: 116 mg/dL — ABNORMAL HIGH (ref 70–99)
Glucose-Capillary: 120 mg/dL — ABNORMAL HIGH (ref 70–99)
Glucose-Capillary: 123 mg/dL — ABNORMAL HIGH (ref 70–99)
Glucose-Capillary: 135 mg/dL — ABNORMAL HIGH (ref 70–99)
Glucose-Capillary: 140 mg/dL — ABNORMAL HIGH (ref 70–99)
Glucose-Capillary: 149 mg/dL — ABNORMAL HIGH (ref 70–99)
Glucose-Capillary: 161 mg/dL — ABNORMAL HIGH (ref 70–99)
Glucose-Capillary: 181 mg/dL — ABNORMAL HIGH (ref 70–99)
Glucose-Capillary: 207 mg/dL — ABNORMAL HIGH (ref 70–99)
Glucose-Capillary: 217 mg/dL — ABNORMAL HIGH (ref 70–99)
Glucose-Capillary: 54 mg/dL — ABNORMAL LOW (ref 70–99)
Glucose-Capillary: 93 mg/dL (ref 70–99)
Glucose-Capillary: 94 mg/dL (ref 70–99)

## 2020-11-01 LAB — MAGNESIUM
Magnesium: 2.4 mg/dL (ref 1.7–2.4)
Magnesium: 2.1 mg/dL (ref 1.7–2.4)

## 2020-11-01 LAB — BPAM PLATELET PHERESIS
Blood Product Expiration Date: 202206192359
ISSUE DATE / TIME: 202206171326
Unit Type and Rh: 6200

## 2020-11-01 LAB — PREPARE PLATELET PHERESIS: Unit division: 0

## 2020-11-01 MED ORDER — ENOXAPARIN SODIUM 30 MG/0.3ML IJ SOSY
30.0000 mg | PREFILLED_SYRINGE | Freq: Every day | INTRAMUSCULAR | Status: DC
  Administered 2020-11-01 – 2020-11-03 (×3): 30 mg via SUBCUTANEOUS
  Filled 2020-11-01 (×3): qty 0.3

## 2020-11-01 MED ORDER — INSULIN ASPART 100 UNIT/ML IJ SOLN
0.0000 [IU] | INTRAMUSCULAR | Status: DC
  Administered 2020-11-01 (×2): 3 [IU] via SUBCUTANEOUS
  Administered 2020-11-02 (×2): 2 [IU] via SUBCUTANEOUS

## 2020-11-01 MED ORDER — INSULIN ASPART 100 UNIT/ML IJ SOLN: 0.0000 [IU] | INTRAMUSCULAR | Status: DC

## 2020-11-01 MED ORDER — FUROSEMIDE 10 MG/ML IJ SOLN
40.0000 mg | Freq: Two times a day (BID) | INTRAMUSCULAR | Status: AC
  Administered 2020-11-01 – 2020-11-02 (×3): 40 mg via INTRAVENOUS
  Filled 2020-11-01 (×3): qty 4

## 2020-11-01 NOTE — Progress Notes (Addendum)
T[facility] BRIEF SICU PROGRESS NOTE  1 Day Post-Op  S/P Procedure(s) (LRB): CORONARY ARTERY BYPASS GRAFTING (CABG)x 4 ON CARDIOPULMONARY BYPASS. LEFT INTERNAL MAMMARY ARTERY AND RIGHT GREATER SAPHENOUS VEIN. LIMA TO LAD, SVG TO DIAG., SVG TO PDA  AND PLB (N/A) TRANSESOPHAGEAL ECHOCARDIOGRAM (TEE) (N/A) INDOCYANINE GREEN FLUORESCENCE IMAGING (ICG) (N/A) ENDOVEIN HARVEST OF GREATER SAPHENOUS VEIN (Right) APPLICATION OF CELL SAVER (N/A)   Stable day although 2 or 3 brief episodes 2nd/3rd degree AV block w/ HR 30's - now VVI pacer backup  Plan: Continue current plan.  Hold metoprolol   Purcell Nails, MD 11/01/2020 5:28 PM

## 2020-11-01 NOTE — Progress Notes (Signed)
[address]       [name] [address]             [phone]        CARDIOTHORACIC SURGERY PROGRESS NOTE   R1 Day Post-Op Procedure(s) (LRB): CORONARY ARTERY BYPASS GRAFTING (CABG)x 4 ON CARDIOPULMONARY BYPASS. LEFT INTERNAL MAMMARY ARTERY AND RIGHT GREATER SAPHENOUS VEIN. LIMA TO LAD, SVG TO DIAG., SVG TO PDA  AND PLB (N/A) TRANSESOPHAGEAL ECHOCARDIOGRAM (TEE) (N/A) INDOCYANINE GREEN FLUORESCENCE IMAGING (ICG) (N/A) ENDOVEIN HARVEST OF GREATER SAPHENOUS VEIN (Right) APPLICATION OF CELL SAVER (N/A)  Subjective: Looks good.  Mild soreness in chest.  Objective: Vital signs: BP Readings from Last 1 Encounters:  11/01/20 123/69   Pulse Readings from Last 1 Encounters:  11/01/20 94   Resp Readings from Last 1 Encounters:  11/01/20 (!) 22   Temp Readings from Last 1 Encounters:  11/01/20 98.6 F (37 C)    Hemodynamics: PAP: (18-30)/(7-18) 27/13 CO:  [3.9 L/min-6.8 L/min] 6.8 L/min CI:  [1.8 L/min/m2-3.2 L/min/m2] 3.2 L/min/m2  Physical Exam:  Rhythm:   sinus  Breath sounds: clear  Heart sounds:  RRR  Incisions:  Dressing dry, intact  Abdomen:  Soft, non-distended, non-tender  Extremities:  Warm, well-perfused  Chest tubes:  low volume thin serosanguinous output, no air leak   Intake/Output from previous day: 06/17 0701 - 06/18 0700 In: 5510.1 [I.V.:2845.8; Blood:1266; IV Piggyback:1398.3] Out: 5285 [Urine:3345; Blood:1100; Chest Tube:840] Intake/Output this shift: Total I/O In: 263.2 [I.V.:263.2] Out: 420 [Urine:370; Chest Tube:50]  Lab Results:  CBC: Recent Labs    10/31/20 1900 11/01/20 0342  WBC 12.0* 13.5*  HGB 12.1* 11.3*  HCT 38.1* 35.6*  PLT 195 191    BMET:  Recent Labs    10/31/20 1900 11/01/20 0342  NA 138 137  K 4.4 4.3  CL 109 107  CO2 23 20*  GLUCOSE 126* 115*  BUN 14 14  CREATININE 1.28* 1.34*  CALCIUM 8.3* 8.5*     PT/INR:   Recent Labs    10/31/20 1430  LABPROT 17.7*  INR 1.5*    CBG (last  3)  Recent Labs    11/01/20 0813 11/01/20 1002 11/01/20 1110  GLUCAP 140* 161* 135*    ABG    Component Value Date/Time   PHART 7.303 (L) 10/31/2020 1853   PCO2ART 43.8 10/31/2020 1853   PO2ART 99 10/31/2020 1853   HCO3 21.6 10/31/2020 1853   TCO2 23 10/31/2020 1853   ACIDBASEDEF 5.0 (H) 10/31/2020 1853   O2SAT 97.0 10/31/2020 1853    CXR: PORTABLE CHEST 1 VIEW   COMPARISON:  10/31/2020   FINDINGS: Endotracheal tube and nasogastric tube have been removed. Left chest tube remains in place, but no pneumothorax is visualized. Right jugular Swan-Ganz catheter is stable in position.   Mild atelectasis in left lung base is unchanged. Right lung remains clear. Heart size is stable. Prior CABG again noted.   IMPRESSION: Mild left basilar atelectasis, without significant change. No pneumothorax visualized.     Electronically Signed   By: Danae Orleans M.D.   On: 11/01/2020 09:49  EKG: NSR w/out acute ischemic changes    Assessment/Plan: S/P Procedure(s) (LRB): CORONARY ARTERY BYPASS GRAFTING (CABG)x 4 ON CARDIOPULMONARY BYPASS. LEFT INTERNAL MAMMARY ARTERY AND RIGHT GREATER SAPHENOUS VEIN. LIMA TO LAD, SVG TO DIAG., SVG TO PDA  AND PLB (N/A) TRANSESOPHAGEAL ECHOCARDIOGRAM (TEE) (N/A) INDOCYANINE GREEN FLUORESCENCE IMAGING (ICG) (N/A) ENDOVEIN HARVEST OF GREATER SAPHENOUS VEIN (Right) APPLICATION OF CELL SAVER (N/A)  Overall doing well POD1 Maintaining NSR w/ stable hemodynamics on low dose milrinone and Neo drips Breathing comfortably w/ O2 sats 95% on 3 L/min and CXR looks good Expected post op acute blood loss anemia, mild Expected post op volume excess, weight not recorded, UOP adequate  Mobilize Diuresis Wean milrinone slowly D/C lines  Purcell Nails, MD 11/01/2020 11:21 AM

## 2020-11-02 ENCOUNTER — Inpatient Hospital Stay (HOSPITAL_COMMUNITY): Payer: Managed Care, Other (non HMO)

## 2020-11-02 LAB — CBC
HCT: 34.2 % — ABNORMAL LOW (ref 39.0–52.0)
Hemoglobin: 10.7 g/dL — ABNORMAL LOW (ref 13.0–17.0)
MCH: 30.5 pg (ref 26.0–34.0)
MCHC: 31.3 g/dL (ref 30.0–36.0)
MCV: 97.4 fL (ref 80.0–100.0)
Platelets: 159 10*3/uL (ref 150–400)
RBC: 3.51 MIL/uL — ABNORMAL LOW (ref 4.22–5.81)
RDW: 15.1 % (ref 11.5–15.5)
WBC: 11 10*3/uL — ABNORMAL HIGH (ref 4.0–10.5)
nRBC: 0 % (ref 0.0–0.2)

## 2020-11-02 LAB — GLUCOSE, CAPILLARY
Glucose-Capillary: 111 mg/dL — ABNORMAL HIGH (ref 70–99)
Glucose-Capillary: 148 mg/dL — ABNORMAL HIGH (ref 70–99)
Glucose-Capillary: 133 mg/dL — ABNORMAL HIGH (ref 70–99)
Glucose-Capillary: 123 mg/dL — ABNORMAL HIGH (ref 70–99)
Glucose-Capillary: 151 mg/dL — ABNORMAL HIGH (ref 70–99)

## 2020-11-02 LAB — BASIC METABOLIC PANEL
Anion gap: 7 (ref 5–15)
BUN: 17 mg/dL (ref 8–23)
CO2: 27 mmol/L (ref 22–32)
Calcium: 8.5 mg/dL — ABNORMAL LOW (ref 8.9–10.3)
Chloride: 101 mmol/L (ref 98–111)
Creatinine, Ser: 1.23 mg/dL (ref 0.61–1.24)
GFR, Estimated: >60 mL/min (ref >60)
Glucose, Bld: 121 mg/dL — ABNORMAL HIGH (ref 70–99)
Potassium: 4.1 mmol/L (ref 3.5–5.1)
Sodium: 135 mmol/L (ref 135–145)

## 2020-11-02 MED ORDER — METOPROLOL TARTRATE 12.5 MG HALF TABLET
12.5000 mg | ORAL_TABLET | Freq: Two times a day (BID) | ORAL | Status: DC
  Administered 2020-11-02 (×2): 12.5 mg via ORAL
  Filled 2020-11-02 (×2): qty 1

## 2020-11-02 MED ORDER — ~~LOC~~ CARDIAC SURGERY, PATIENT & FAMILY EDUCATION: Freq: Once | Status: AC

## 2020-11-02 MED ORDER — FUROSEMIDE 40 MG PO TABS
40.0000 mg | ORAL_TABLET | Freq: Two times a day (BID) | ORAL | Status: DC
  Administered 2020-11-03: 40 mg via ORAL
  Filled 2020-11-02: qty 1

## 2020-11-02 MED ORDER — INSULIN ASPART 100 UNIT/ML IJ SOLN
0.0000 [IU] | Freq: Three times a day (TID) | INTRAMUSCULAR | Status: DC
  Administered 2020-11-02 – 2020-11-03 (×5): 2 [IU] via SUBCUTANEOUS
  Administered 2020-11-03 – 2020-11-04 (×2): 4 [IU] via SUBCUTANEOUS

## 2020-11-02 MED ORDER — POTASSIUM CHLORIDE CRYS ER 20 MEQ PO TBCR
20.0000 meq | EXTENDED_RELEASE_TABLET | Freq: Two times a day (BID) | ORAL | Status: DC
  Administered 2020-11-02 – 2020-11-03 (×3): 20 meq via ORAL
  Filled 2020-11-02 (×3): qty 1

## 2020-11-02 NOTE — Progress Notes (Signed)
Pt arrived to 4e from [facility]. Pt oriented to room and staff. CHG bath completed. Vitals obtained and stable. Telmetry box applied and CCMD notified x2 verifiers.

## 2020-11-02 NOTE — Progress Notes (Signed)
[address]       [place] [address]             [phone]        CARDIOTHORACIC SURGERY PROGRESS NOTE   R2 Days Post-Op Procedure(s) (LRB): CORONARY ARTERY BYPASS GRAFTING (CABG)x 4 ON CARDIOPULMONARY BYPASS. LEFT INTERNAL MAMMARY ARTERY AND RIGHT GREATER SAPHENOUS VEIN. LIMA TO LAD, SVG TO DIAG., SVG TO PDA  AND PLB (N/A) TRANSESOPHAGEAL ECHOCARDIOGRAM (TEE) (N/A) INDOCYANINE GREEN FLUORESCENCE IMAGING (ICG) (N/A) ENDOVEIN HARVEST OF GREATER SAPHENOUS VEIN (Right) APPLICATION OF CELL SAVER (N/A)  Subjective: Looks good.  Had a good night.  Already ambulated and appetite improving  Objective: Vital signs: BP Readings from Last 1 Encounters:  11/02/20 101/66   Pulse Readings from Last 1 Encounters:  11/02/20 98   Resp Readings from Last 1 Encounters:  11/02/20 20   Temp Readings from Last 1 Encounters:  11/02/20 99 F (37.2 C)    Hemodynamics: PAP: (19-24)/(10-11) 19/10  Physical Exam:  Rhythm:   sinus  Breath sounds: clear  Heart sounds:  RRR  Incisions:  Dresssing dry, intact  Abdomen:  Soft, non-distended, non-tender  Extremities:  Warm, well-perfused  Chest tubes:  low volume thin serosanguinous output, no air leak    Intake/Output from previous day: 06/18 0701 - 06/19 0700 In: 1000.2 [P.O.:240; I.V.:460.2; IV Piggyback:300] Out: 2570 [Urine:2240; Chest Tube:330] Intake/Output this shift: Total I/O In: -  Out: 860 [Urine:590; Chest Tube:270]  Lab Results:  CBC: Recent Labs    11/01/20 1459 11/02/20 0453  WBC 10.6* 11.0*  HGB 10.7* 10.7*  HCT 33.6* 34.2*  PLT 152 159    BMET:  Recent Labs    11/01/20 1459 11/02/20 0453  NA 133* 135  K 4.3 4.1  CL 104 101  CO2 22 27  GLUCOSE 225* 121*  BUN 15 17  CREATININE 1.20 1.23  CALCIUM 8.3* 8.5*     PT/INR:   Recent Labs    10/31/20 1430  LABPROT 17.7*  INR 1.5*    CBG (last 3)  Recent Labs    11/01/20 2355 11/02/20 0404 11/02/20 0718  GLUCAP 149*  111* 148*    ABG    Component Value Date/Time   PHART 7.303 (L) 10/31/2020 1853   PCO2ART 43.8 10/31/2020 1853   PO2ART 99 10/31/2020 1853   HCO3 21.6 10/31/2020 1853   TCO2 23 10/31/2020 1853   ACIDBASEDEF 5.0 (H) 10/31/2020 1853   O2SAT 97.0 10/31/2020 1853    CXR: PORTABLE CHEST 1 VIEW   COMPARISON:  11/01/2020   FINDINGS: Swan-Ganz catheter has been removed and right jugular Cordis remains in place. Left chest tube also remains in place, and no pneumothorax is identified. Persistent atelectasis or infiltrate is seen in the left retrocardiac lung base. Right lung is clear. Prior CABG again noted.   : Persistent left retrocardiac atelectasis versus infiltrate. No pneumothorax visualized.     Electronically Signed   By: Danae Orleans M.D.   On: 11/02/2020 07:31  Assessment/Plan: S/P Procedure(s) (LRB): CORONARY ARTERY BYPASS GRAFTING (CABG)x 4 ON CARDIOPULMONARY BYPASS. LEFT INTERNAL MAMMARY ARTERY AND RIGHT GREATER SAPHENOUS VEIN. LIMA TO LAD, SVG TO DIAG., SVG TO PDA  AND PLB (N/A) TRANSESOPHAGEAL ECHOCARDIOGRAM (TEE) (N/A) INDOCYANINE GREEN FLUORESCENCE IMAGING (ICG) (N/A) ENDOVEIN HARVEST OF GREATER SAPHENOUS VEIN (Right) APPLICATION OF CELL SAVER (N/A)  Doing well POD2 Maintaining NSR w/ stable BP Breathing comfortably w/ O2 sats 94-97% on 2 L/min and CXR looks good Expected post  op acute blood loss anemia, mild, stable Expected post op volume excess, weight reportedly 2.5 kg > preop, diuresing well  Mobilize D/C tubes and lines Diuresis Transfer 4E  Purcell Nails, MD 11/02/2020 10:49 AM

## 2020-11-03 ENCOUNTER — Encounter (HOSPITAL_COMMUNITY): Payer: Self-pay | Admitting: Cardiothoracic Surgery

## 2020-11-03 ENCOUNTER — Inpatient Hospital Stay (HOSPITAL_COMMUNITY): Payer: Managed Care, Other (non HMO)

## 2020-11-03 LAB — CBC
HCT: 39.7 % (ref 39.0–52.0)
Hemoglobin: 12.7 g/dL — ABNORMAL LOW (ref 13.0–17.0)
MCH: 30.8 pg (ref 26.0–34.0)
MCHC: 32 g/dL (ref 30.0–36.0)
MCV: 96.4 fL (ref 80.0–100.0)
Platelets: 194 10*3/uL (ref 150–400)
RBC: 4.12 MIL/uL — ABNORMAL LOW (ref 4.22–5.81)
RDW: 14.6 % (ref 11.5–15.5)
WBC: 13.3 10*3/uL — ABNORMAL HIGH (ref 4.0–10.5)
nRBC: 0 % (ref 0.0–0.2)

## 2020-11-03 LAB — BASIC METABOLIC PANEL
Anion gap: 11 (ref 5–15)
BUN: 23 mg/dL (ref 8–23)
CO2: 26 mmol/L (ref 22–32)
Calcium: 9.3 mg/dL (ref 8.9–10.3)
Chloride: 98 mmol/L (ref 98–111)
Creatinine, Ser: 1.48 mg/dL — ABNORMAL HIGH (ref 0.61–1.24)
GFR, Estimated: 50 mL/min — ABNORMAL LOW (ref >60)
Glucose, Bld: 148 mg/dL — ABNORMAL HIGH (ref 70–99)
Potassium: 4.5 mmol/L (ref 3.5–5.1)
Sodium: 135 mmol/L (ref 135–145)

## 2020-11-03 LAB — GLUCOSE, CAPILLARY
Glucose-Capillary: 132 mg/dL — ABNORMAL HIGH (ref 70–99)
Glucose-Capillary: 136 mg/dL — ABNORMAL HIGH (ref 70–99)
Glucose-Capillary: 92 mg/dL (ref 70–99)
Glucose-Capillary: 178 mg/dL — ABNORMAL HIGH (ref 70–99)

## 2020-11-03 MED ORDER — POTASSIUM CHLORIDE CRYS ER 20 MEQ PO TBCR
20.0000 meq | EXTENDED_RELEASE_TABLET | Freq: Every day | ORAL | Status: DC
  Administered 2020-11-04: 20 meq via ORAL
  Filled 2020-11-03: qty 1

## 2020-11-03 MED ORDER — POLYETHYLENE GLYCOL 3350 17 G PO PACK
17.0000 g | PACK | Freq: Every day | ORAL | Status: DC
  Administered 2020-11-03: 17 g via ORAL
  Filled 2020-11-03 (×2): qty 1

## 2020-11-03 MED ORDER — METOPROLOL TARTRATE 25 MG PO TABS
25.0000 mg | ORAL_TABLET | Freq: Two times a day (BID) | ORAL | Status: DC
  Administered 2020-11-03 – 2020-11-04 (×3): 25 mg via ORAL
  Filled 2020-11-03 (×3): qty 1

## 2020-11-03 MED ORDER — FUROSEMIDE 40 MG PO TABS
40.0000 mg | ORAL_TABLET | Freq: Every day | ORAL | Status: DC
  Administered 2020-11-04: 40 mg via ORAL
  Filled 2020-11-03: qty 1

## 2020-11-03 NOTE — Progress Notes (Signed)
CARDIAC REHAB PHASE I   PRE:  Rate/Rhythm: 96 SR  BP:  Supine:   Sitting: 126/81  Standing:    SaO2: 93%RA  MODE:  Ambulation: 650 ft   POST:  Rate/Rhythm: 112 ST  BP:  Supine:   Sitting: 152/82  Standing:    SaO2: 90-95%RA 0845-0925 Pt walked 650 ft on RA with hand held asst with steady gait and did not need to stop and rest. Tolerated well. Discussed staying in the tube and use of IS. To bed after walk. Knows he needs two more walks today.   Luetta Nutting, RN BSN  11/03/2020 9:22 AM

## 2020-11-03 NOTE — Discharge Summary (Signed)
[address]       [place] [address]             [phone]       Physician Discharge Summary  Patient ID: Ronald Roth MRN: [id number] DOB/AGE: 10-16-1950 70 y.o.  Admit date: 10/31/2020 Discharge date: 11/04/2020  Admission Diagnoses:  Patient Active Problem List   Diagnosis Date Noted   Coronary artery disease 10/31/2020   Stable angina (HCC) 10/20/2020   Cardiomyopathy (HCC) 10/20/2020   Type 2 diabetes, controlled, with neuropathy (HCC) 08/22/2020   Coronary artery disease involving native coronary artery of native heart without angina pectoris 08/22/2020   Hyperlipidemia associated with type 2 diabetes mellitus (HCC) 08/22/2020     Discharge Diagnoses:  Active Problems:   Coronary artery disease   Discharged Condition: good  HPI: 70 year old man is referred for consideration of CABG.  He is a 10-year history of coronary artery disease status post myocardial infarction 10 years ago at which point he underwent PCI.  He retired shortly thereafter.  His symptoms were relatively stable until this past spring when he noticed fatigue and shortness of breath with walking up inclines.  He also notes generalized fatigue.  Occasionally, in retrospect, he experienced mild chest pain and shortness of breath with other less strenuous activities.  He presented with the symptoms to his physician who ordered a coronary CT angiogram which demonstrated severe three-vessel disease.  This was confirmed by left heart catheterization.  In addition, he was found to have a globally reduced LV function with EF in the 25 to 30% range.  He denies orthopnea PND or significant peripheral edema.  Other coronary risk factors include diabetes and hypertension.  He also has a strong family history for coronary artery disease. Dr. Vickey Sages discussed the need for coronary artery bypass grafting surgery.Potential risks, benefits, and complications of the surgery were discussed with the  patient and he agreed to proceed with surgery. Pre operative carotid duplex showed a 40-59% right internal carotid artery stenosis and no significant left internal carotid artery stenosis. Patient underwent a CABG x 4 on 10/31/2020.  Hospital Course: Patient was transferred in stable condition from the OR to [facility] ICU following surgery. He was extubated in a timely manner. POD 1 she was maintaining NSR and was hemodynamically stable. He remained on milrinone and Neo drips. We discontinued his lines and were weaning milrinone slowly. We began a diuretic regimen for fluid overload. POD 2 we discontinued his chest tubes and he was deemed stable to be transferred to the step down unit for continued care. POD 3 we increased his BB for better BP and HR control. He was on room air with good oxygen saturation. He was encouraged to ambulate multiple times a day and his pain was well controlled.  Today, he is tolerating room air, walking independently, his incisions are healing well, and he is ready for discharge home.   Consults: None  Significant Diagnostic Studies:   Left Main  Vessel is large. Vessel is angiographically normal.  Left Anterior Descending  Mid LAD-1 lesion is 50% stenosed. The lesion is severely calcified.  Mid LAD-2 lesion is 90% stenosed.  First Diagonal Branch  Vessel is large in size.  Lateral First Diagonal Branch  Vessel is moderate in size.  Collaterals  Lat 1st Diag filled by collaterals from Lat 1st Diag.    Lat 1st Diag lesion is 100% stenosed. The lesion is chronically occluded with bridging collateral flow.  Second Diagonal Branch  Vessel is moderate in size.  2nd Diag lesion is 30% stenosed.  Third Diagonal Branch  Vessel is small in size.  Left Circumflex  Vessel is small.  Mid Cx lesion is 20% stenosed. The lesion is eccentric.  First Obtuse Marginal Branch  Vessel is small in size.  Collaterals  1st Mrg filled by collaterals from 1st Diag.    1st Mrg lesion is  100% stenosed. The lesion is chronically occluded with left-to-left collateral flow.  Second Obtuse Marginal Branch  Vessel is small in size.  Right Coronary Artery  Vessel is large.  Prox RCA lesion is 25% stenosed.  Dist RCA lesion is 40% stenosed.  Right Posterior Descending Artery  Vessel is moderate in size.  RPDA-1 lesion is 90% stenosed.  RPDA-2 lesion is 70% stenosed.  Right Posterior Atrioventricular Artery  RPAV-1 lesion is 20% stenosed. The lesion was previously treated.  RPAV-2 lesion is 60% stenosed.  First Right Posterolateral Branch  Vessel is large in size.   Intervention   No interventions have been documented.  Treatments:   10/31/2020   11:01 AM   PATIENT:  Ronald Roth  70 y.o. male   PRE-OPERATIVE DIAGNOSIS:  CAD   POST-OPERATIVE DIAGNOSIS:  CAD   PROCEDURE:  TRANSESOPHAGEAL ECHOCARDIOGRAM (TEE), CORONARY ARTERY BYPASS GRAFTING (CABG)x 4 ON CARDIOPULMONARY BYPASS (LIMA to LAD, SVG to DIAGONAL, SVG to PDA and PLB), LEFT INTERNAL MAMMARY ARTERY AND EVH of RIGHT GREATER SAPHENOUS VEIN, INDOCYANINE GREEN FLUORESCENCE IMAGING (ICG)    RIGHT EVH HARVEST TIME: 20 minutes; RIGHT EVH PREP TIME: 14 minutes     SURGEON:  Surgeon(s) and Role:    Linden Dolin, MD - Primary   PHYSICIAN ASSISTANT: Doree Fudge PA-C   ASSISTANTS: Katherene Ponto RNFA    ANESTHESIA:   general   EBL:  Per anesthesia, perfusion record   DRAINS:  Chest tubes placed in the mediastinal and pleural spaces     LOCAL MEDICATIONS USED:  OTHER Exparel   COUNTS CORRECT:  YES   DICTATION: .Dragon Dictation   PLAN OF CARE: Admit to inpatient    PATIENT DISPOSITION:  ICU - intubated and hemodynamically stable.   Delay start of Pharmacological VTE agent (>24hrs) due to surgical blood loss or risk of bleeding: yes   BASELINE WEIGHT: 95.6 kg  Discharge Exam: Blood pressure 118/71, pulse 94, temperature 98.5 F (36.9 C), temperature source Oral, resp. rate 20, height 5'  10" (1.778 m), weight 96.4 kg, SpO2 98 %.  General appearance: alert, cooperative, and no distress Heart: regular rate and rhythm, S1, S2 normal, no murmur, click, rub or gallop Lungs: clear to auscultation bilaterally Abdomen: soft, non-tender; bowel sounds normal; no masses,  no organomegaly Extremities: extremities normal, atraumatic, no cyanosis or edema Wound: clean and dry  Disposition:  Discharge disposition: 01-Home or Self Care      Discharge Instructions     Amb Referral to Cardiac Rehabilitation   Complete by: As directed    Diagnosis: CABG   CABG X ___: 4   After initial evaluation and assessments completed: Virtual Based Care may be provided alone or in conjunction with Phase 2 Cardiac Rehab based on patient barriers.: Yes      Allergies as of 11/04/2020   No Known Allergies      Medication List     STOP taking these medications    lisinopril 20 MG tablet Commonly known as: ZESTRIL   tadalafil 20 MG tablet Commonly known as: CIALIS  TAKE these medications    acetaminophen 500 MG tablet Commonly known as: TYLENOL Take 2 tablets (1,000 mg total) by mouth every 6 (six) hours as needed. What changed:  when to take this reasons to take this   aspirin EC 81 MG tablet Take 81 mg by mouth in the morning. Swallow whole.   atorvastatin 80 MG tablet Commonly known as: LIPITOR Take 80 mg by mouth in the morning.   cholecalciferol 25 MCG (1000 UNIT) tablet Commonly known as: VITAMIN D3 Take 1,000 Units by mouth daily.   diphenhydrAMINE 25 MG tablet Commonly known as: BENADRYL Take 75 mg by mouth at bedtime.   ezetimibe 10 MG tablet Commonly known as: ZETIA Take 1 tablet (10 mg total) by mouth in the morning.   fenofibrate 145 MG tablet Commonly known as: TRICOR Take 145 mg by mouth in the morning.   furosemide 40 MG tablet Commonly known as: LASIX Take 1 tablet (40 mg total) by mouth daily. Start taking on: November 05, 2020    Jardiance 25 MG Tabs tablet Generic drug: empagliflozin Take 1 tablet (25 mg total) by mouth daily before breakfast.   metoprolol succinate 50 MG 24 hr tablet Commonly known as: TOPROL-XL Take 1 tablet (50 mg total) by mouth daily. Take with or immediately following a meal. What changed: when to take this   nitroGLYCERIN 0.4 MG SL tablet Commonly known as: NITROSTAT Place 1 tablet (0.4 mg total) under the tongue every 5 (five) minutes x 3 doses as needed for chest pain.   omeprazole 20 MG capsule Commonly known as: PRILOSEC Take 20 mg by mouth in the morning.   oxyCODONE 5 MG immediate release tablet Commonly known as: Oxy IR/ROXICODONE Take 1 tablet (5 mg total) by mouth every 4 (four) hours as needed for severe pain.   Ozempic (0.25 or 0.5 MG/DOSE) 2 MG/1.5ML Sopn Generic drug: Semaglutide(0.25 or 0.5MG /DOS) Inject 0.5 mg into the skin every Monday. Start taking on: November 10, 2020   potassium chloride SA 20 MEQ tablet Commonly known as: KLOR-CON Take 1 tablet (20 mEq total) by mouth daily. Start taking on: November 05, 2020   Vitamin D (Ergocalciferol) 1.25 MG (50000 UNIT) Caps capsule Commonly known as: DRISDOL Take 50,000 Units by mouth 2 (two) times a week. Sundays & Thursdays        Follow-up Information     Linden Dolin, MD. Go on 11/27/2020.   Specialty: Cardiothoracic Surgery Contact information: [address] [place] [address] [place] [address] [phone]         Sondra Barges, PA-C Follow up.   Specialties: Physician Assistant, Cardiology, Radiology Why: Follow-up with Cardiology scheduled for 11/18/2020 at 11:00am. Please arrive 15 minutes early for check-in. If this day/time does not work for you, please call our office to reschedule. Contact information: [address] [place] [address] [phone]         [facility] [place] Follow up on 11/11/2020.   Specialty: Cardiothoracic Surgery Why:  Appointment is at 10:00, For suture removal Contact information: [address] [place], [address] [place] [address] [phone]                Signed: Sharlene Dory 11/04/2020, 1:13 PM

## 2020-11-03 NOTE — Addendum Note (Signed)
Addendum  created 11/03/20 0647 by Rachel Moulds, CRNA   Order list changed

## 2020-11-03 NOTE — Progress Notes (Addendum)
      [address]       [place] [address]             [phone]        3 Days Post-Op Procedure(s) (LRB): CORONARY ARTERY BYPASS GRAFTING (CABG)x 4 ON CARDIOPULMONARY BYPASS. LEFT INTERNAL MAMMARY ARTERY AND RIGHT GREATER SAPHENOUS VEIN. LIMA TO LAD, SVG TO DIAG., SVG TO PDA  AND PLB (N/A) TRANSESOPHAGEAL ECHOCARDIOGRAM (TEE) (N/A) INDOCYANINE GREEN FLUORESCENCE IMAGING (ICG) (N/A) ENDOVEIN HARVEST OF GREATER SAPHENOUS VEIN (Right) APPLICATION OF CELL SAVER (N/A) Subjective: Feels okay today, his pain is well controlled and he hasn't had any shortness of breath with activity  Objective: Vital signs in last 24 hours: Temp:  [98 F (36.7 C)-99.7 F (37.6 C)] 99.7 F (37.6 C) (06/20 0732) Pulse Rate:  [77-107] 102 (06/20 0732) Cardiac Rhythm: Sinus tachycardia;Bundle branch block (06/19 1900) Resp:  [18-26] 23 (06/20 0732) BP: (96-140)/(66-94) 118/74 (06/20 0732) SpO2:  [89 %-97 %] 93 % (06/20 0732) Weight:  [96.4 kg] 96.4 kg (06/20 0410)     Intake/Output from previous day: 06/19 0701 - 06/20 0700 In: 400 [P.O.:400] Out: 1010 [Urine:860; Chest Tube:150] Intake/Output this shift: No intake/output data recorded.  General appearance: alert, cooperative, and no distress Heart: regular rate and rhythm, S1, S2 normal, no murmur, click, rub or gallop Lungs: clear to auscultation bilaterally Abdomen: soft, non-tender; bowel sounds normal; no masses,  no organomegaly Extremities: extremities normal, atraumatic, no cyanosis or edema Wound: clean and dry  Lab Results: Recent Labs    11/02/20 0453 11/03/20 0256  WBC 11.0* 13.3*  HGB 10.7* 12.7*  HCT 34.2* 39.7  PLT 159 194   BMET:  Recent Labs    11/02/20 0453 11/03/20 0256  NA 135 135  K 4.1 4.5  CL 101 98  CO2 27 26  GLUCOSE 121* 148*  BUN 17 23  CREATININE 1.23 1.48*  CALCIUM 8.5* 9.3    PT/INR:  Recent Labs    10/31/20 1430  LABPROT 17.7*  INR 1.5*   ABG    Component Value  Date/Time   PHART 7.303 (L) 10/31/2020 1853   HCO3 21.6 10/31/2020 1853   TCO2 23 10/31/2020 1853   ACIDBASEDEF 5.0 (H) 10/31/2020 1853   O2SAT 97.0 10/31/2020 1853   CBG (last 3)  Recent Labs    11/02/20 1627 11/02/20 2053 11/03/20 0616  GLUCAP 123* 151* 132*    Assessment/Plan: S/P Procedure(s) (LRB): CORONARY ARTERY BYPASS GRAFTING (CABG)x 4 ON CARDIOPULMONARY BYPASS. LEFT INTERNAL MAMMARY ARTERY AND RIGHT GREATER SAPHENOUS VEIN. LIMA TO LAD, SVG TO DIAG., SVG TO PDA  AND PLB (N/A) TRANSESOPHAGEAL ECHOCARDIOGRAM (TEE) (N/A) INDOCYANINE GREEN FLUORESCENCE IMAGING (ICG) (N/A) ENDOVEIN HARVEST OF GREATER SAPHENOUS VEIN (Right) APPLICATION OF CELL SAVER (N/A)  CV-NSR in the 90s-100s, BP well controlled. Increase lopressor. Continue asa/statin/BB Pulm-tolerating room air with good oxygen saturation. 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 Continue to ambulate as able. Continue to use incentive spirometer. Maybe home Wednesday if remains stable.    LOS: 3 days    11/03/2020

## 2020-11-03 NOTE — Progress Notes (Signed)
Mobility Specialist - Progress Note   11/03/20 1441  Mobility  Activity Ambulated in hall  Level of Assistance Standby assist, set-up cues, supervision of patient - no hands on  Assistive Device None  Distance Ambulated (ft) 720 ft  Mobility Ambulated with assistance in hallway  Mobility Response Tolerated well  Mobility performed by Mobility specialist  $Mobility charge 1 Mobility   Pt asx throughout ambulation. Pt back in bed after walk, call bell at side. VSS throughout.  Ronald Roth Mobility Specialist Mobility Specialist Phone: [phone]

## 2020-11-04 ENCOUNTER — Other Ambulatory Visit: Payer: Self-pay | Admitting: *Deleted

## 2020-11-04 DIAGNOSIS — Z951 Presence of aortocoronary bypass graft: Secondary | ICD-10-CM

## 2020-11-04 LAB — BASIC METABOLIC PANEL
Anion gap: 6 (ref 5–15)
BUN: 19 mg/dL (ref 8–23)
CO2: 30 mmol/L (ref 22–32)
Calcium: 8.8 mg/dL — ABNORMAL LOW (ref 8.9–10.3)
Chloride: 100 mmol/L (ref 98–111)
Creatinine, Ser: 1.13 mg/dL (ref 0.61–1.24)
GFR, Estimated: >60 mL/min (ref >60)
Glucose, Bld: 140 mg/dL — ABNORMAL HIGH (ref 70–99)
Potassium: 4.2 mmol/L (ref 3.5–5.1)
Sodium: 136 mmol/L (ref 135–145)

## 2020-11-04 LAB — GLUCOSE, CAPILLARY
Glucose-Capillary: 179 mg/dL — ABNORMAL HIGH (ref 70–99)
Glucose-Capillary: 137 mg/dL — ABNORMAL HIGH (ref 70–99)

## 2020-11-04 MED ORDER — EZETIMIBE 10 MG PO TABS: 10.0000 mg | ORAL_TABLET | Freq: Every morning | ORAL | Status: DC

## 2020-11-04 MED ORDER — NITROGLYCERIN 0.4 MG SL SUBL: 0.4000 mg | SUBLINGUAL_TABLET | SUBLINGUAL | Status: DC | PRN

## 2020-11-04 MED ORDER — POTASSIUM CHLORIDE CRYS ER 20 MEQ PO TBCR: 20.0000 meq | EXTENDED_RELEASE_TABLET | Freq: Every day | ORAL | 0 refills | Status: DC

## 2020-11-04 MED ORDER — ACETAMINOPHEN 500 MG PO TABS: 1000.0000 mg | ORAL_TABLET | Freq: Four times a day (QID) | ORAL | 0 refills | Status: DC | PRN

## 2020-11-04 MED ORDER — FUROSEMIDE 40 MG PO TABS: 40.0000 mg | ORAL_TABLET | Freq: Every day | ORAL | 0 refills | Status: DC

## 2020-11-04 MED ORDER — OXYCODONE HCL 5 MG PO TABS: 5.0000 mg | ORAL_TABLET | ORAL | 0 refills | Status: DC | PRN

## 2020-11-04 MED ORDER — OZEMPIC (0.25 OR 0.5 MG/DOSE) 2 MG/1.5ML ~~LOC~~ SOPN: 0.5000 mg | PEN_INJECTOR | SUBCUTANEOUS | Status: DC

## 2020-11-04 MED FILL — Sodium Bicarbonate IV Soln 8.4%: INTRAVENOUS | Qty: 50 | Status: AC

## 2020-11-04 MED FILL — Heparin Sodium (Porcine) Inj 1000 Unit/ML: INTRAMUSCULAR | Qty: 10 | Status: AC

## 2020-11-04 MED FILL — Heparin Sodium (Porcine) Inj 1000 Unit/ML: INTRAMUSCULAR | Qty: 30 | Status: AC

## 2020-11-04 MED FILL — Calcium Chloride Inj 10%: INTRAVENOUS | Qty: 20 | Status: AC

## 2020-11-04 MED FILL — Magnesium Sulfate Inj 50%: INTRAMUSCULAR | Qty: 10 | Status: AC

## 2020-11-04 MED FILL — Sodium Chloride IV Soln 0.9%: INTRAVENOUS | Qty: 2000 | Status: AC

## 2020-11-04 MED FILL — Electrolyte-R (PH 7.4) Solution: INTRAVENOUS | Qty: 6000 | Status: AC

## 2020-11-04 MED FILL — Mannitol IV Soln 20%: INTRAVENOUS | Qty: 500 | Status: AC

## 2020-11-04 MED FILL — Potassium Chloride Inj 2 mEq/ML: INTRAVENOUS | Qty: 40 | Status: AC

## 2020-11-04 NOTE — Progress Notes (Signed)
Mobility Specialist - Progress Note   11/04/20 1307  Mobility  Activity Ambulated in hall  Level of Assistance Independent  Assistive Device None  Distance Ambulated (ft) 700 ft  Mobility Ambulated with assistance in hallway  Mobility Response Tolerated well  Mobility performed by Mobility specialist  $Mobility charge 1 Mobility   Pt asx throughout ambulation. Pt back in bed after walk, call bell at side. VSS throughout.  Mamie Levers Mobility Specialist Mobility Specialist Phone: [phone]

## 2020-11-04 NOTE — Progress Notes (Addendum)
      [address]       [place] [address]             [phone]      4 Days Post-Op Procedure(s) (LRB): CORONARY ARTERY BYPASS GRAFTING (CABG)x 4 ON CARDIOPULMONARY BYPASS. LEFT INTERNAL MAMMARY ARTERY AND RIGHT GREATER SAPHENOUS VEIN. LIMA TO LAD, SVG TO DIAG., SVG TO PDA  AND PLB (N/A) TRANSESOPHAGEAL ECHOCARDIOGRAM (TEE) (N/A) INDOCYANINE GREEN FLUORESCENCE IMAGING (ICG) (N/A) ENDOVEIN HARVEST OF GREATER SAPHENOUS VEIN (Right) APPLICATION OF CELL SAVER (N/A) Subjective: Feels good this morning, no issues overnight. He did have a bowel movement.   Objective: Vital signs in last 24 hours: Temp:  [98.2 F (36.8 C)-99.7 F (37.6 C)] 98.5 F (36.9 C) (06/21 0334) Pulse Rate:  [85-102] 93 (06/21 0334) Cardiac Rhythm: Heart block (06/20 1900) Resp:  [20-23] 20 (06/21 0334) BP: (112-135)/(74-78) 129/78 (06/21 0334) SpO2:  [93 %-96 %] 96 % (06/21 0334)     Intake/Output from previous day: 06/20 0701 - 06/21 0700 In: 360 [P.O.:360] Out: -  Intake/Output this shift: No intake/output data recorded.  General appearance: alert, cooperative, and no distress Heart: regular rate and rhythm, S1, S2 normal, no murmur, click, rub or gallop Lungs: clear to auscultation bilaterally Abdomen: soft, non-tender; bowel sounds normal; no masses,  no organomegaly Extremities: extremities normal, atraumatic, no cyanosis or edema Wound: clean and dry  Lab Results: Recent Labs    11/02/20 0453 11/03/20 0256  WBC 11.0* 13.3*  HGB 10.7* 12.7*  HCT 34.2* 39.7  PLT 159 194   BMET:  Recent Labs    11/03/20 0256 11/04/20 0100  NA 135 136  K 4.5 4.2  CL 98 100  CO2 26 30  GLUCOSE 148* 140*  BUN 23 19  CREATININE 1.48* 1.13  CALCIUM 9.3 8.8*    PT/INR: No results for input(s): LABPROT, INR in the last 72 hours. ABG    Component Value Date/Time   PHART 7.303 (L) 10/31/2020 1853   HCO3 21.6 10/31/2020 1853   TCO2 23 10/31/2020 1853   ACIDBASEDEF 5.0 (H)  10/31/2020 1853   O2SAT 97.0 10/31/2020 1853   CBG (last 3)  Recent Labs    11/03/20 1603 11/03/20 2030 11/04/20 0613  GLUCAP 92 178* 179*    Assessment/Plan: S/P Procedure(s) (LRB): CORONARY ARTERY BYPASS GRAFTING (CABG)x 4 ON CARDIOPULMONARY BYPASS. LEFT INTERNAL MAMMARY ARTERY AND RIGHT GREATER SAPHENOUS VEIN. LIMA TO LAD, SVG TO DIAG., SVG TO PDA  AND PLB (N/A) TRANSESOPHAGEAL ECHOCARDIOGRAM (TEE) (N/A) INDOCYANINE GREEN FLUORESCENCE IMAGING (ICG) (N/A) ENDOVEIN HARVEST OF GREATER SAPHENOUS VEIN (Right) APPLICATION OF CELL SAVER (N/A)  CV-NSR in the 90s, two episodes of sinus tachycardia yesterday but no further AV block recorded. BP well controlled.  Continue asa/statin/BB Pulm-tolerating room air with good oxygen saturation. CXR improved compared to yesterday's study Renal-creatinine 1.13 which is much improved. Electrolytes okay, decrease lasix to 40mg  daily and trend BMP H and H 12.7/39.7, expected acute blood loss anemia Endo-blood glucose well controlled on current regimen  Plan: Okay to remove EPW today. Possibly home later today vs. Tomorrow. Walking independently in the halls 3x yesterday. Encouraged incentive spirometer, tolerating room air.    Addendum: EPW pulled at 8am, okay for discharge per surgeon.    LOS: 4 days    04-24-1989 11/04/2020

## 2020-11-04 NOTE — Progress Notes (Signed)
EPW removed pt tolerated well. VSS.

## 2020-11-04 NOTE — Progress Notes (Signed)
CARDIAC REHAB PHASE I   PRE:  Rate/Rhythm: 92 SR  BP:  Supine:   Sitting: 139/82  Standing:    SaO2: 94%RA  MODE:  Ambulation: 700 ft   POST:  Rate/Rhythm: 102 ST  BP:  Supine:   Sitting: 118/78  Standing:    SaO2: 96%RA 0943-1030 Pt walked 700 ft on RA with steady gait and hand held asst. Tolerated well. Education completed with pt and family who voiced understanding. Reviewed sternal precautions and staying in the tube. Gave diabetic and heart healthy diet instruction. Encouraged IS and walking guidelines given. Discussed CRP 2 and referral to [facility] done. Offered discharge video. Discussed wound care.    Luetta Nutting, RN BSN  11/04/2020 10:24 AM

## 2020-11-05 NOTE — Op Note (Deleted)
cho 

## 2020-11-05 NOTE — Op Note (Signed)
CARDIOTHORACIC SURGERY OPERATIVE NOTE  Date of Procedure: 10/31/20  Preoperative Diagnosis: Severe 3-vessel Coronary Artery Disease  Postoperative Diagnosis: Same  Procedure:   Coronary Artery Bypass Grafting x 4   Left Internal Mammary Artery to Distal Left Anterior Descending Coronary Artery; Saphenous Vein Graft to Posterior Descending Coronary Artery and right posterolateral coronary artery as sequenced graft; Sapheonous Vein Graft to high Diagonal Branch Coronary Artery; Endoscopic Vein Harvest from right Thigh and Lower Leg  Surgeon: B.  Lorayne Marek, MD  Assistant: Jacques Earthly, PA-C  Anesthesia: General  Operative Findings: Reduced left ventricular systolic function Good quality left internal mammary artery conduit Good quality saphenous vein conduit Good quality target vessels for grafting    BRIEF CLINICAL NOTE AND INDICATIONS FOR SURGERY  70 year old man with a 10-year history of coronary artery disease noticed increased shortness of breath and fatigue when walking up inclines this past spring.  He presented with his symptoms to local physician who performed a cardiac work-up and given his past medical history of coronary disease underwent left heart catheterization.  This demonstrated severe multivessel coronary artery disease and depressed LV function.  He is referred for CABG.  Patient was thoroughly evaluated and is considered a good candidate for the procedure.  He understands the risks and benefits of the procedure and wishes to proceed.   DETAILS OF THE OPERATIVE PROCEDURE  Preparation:  The patient is brought to the operating room on the above mentioned date and central monitoring was established by the anesthesia team including placement of Swan-Ganz catheter and radial arterial line. The patient is placed in the supine position on the operating table.  Intravenous antibiotics are administered. General endotracheal anesthesia is induced uneventfully. A Foley  catheter is placed.  Baseline transesophageal echocardiogram was performed.  Findings were notable for globally reduced LV function  The patient's chest, abdomen, both groins, and both lower extremities are prepared and draped in a sterile manner. A time out procedure is performed.   Surgical Approach and Conduit Harvest:  A median sternotomy incision was performed and the left internal mammary artery is dissected from the chest wall and prepared for bypass grafting. The left internal mammary artery is notably good quality conduit. Simultaneously, the greater saphenous vein is obtained from the patient's right thigh using endoscopic vein harvest technique. The saphenous vein is notably good quality conduit. After removal of the saphenous vein, the small surgical incisions in the lower extremity are closed with absorbable suture. Following systemic heparinization, the left internal mammary artery was transected distally noted to have excellent flow.   Extracorporeal Cardiopulmonary Bypass and Myocardial Protection:  The pericardium is opened. The ascending aorta is nondiseased in appearance. The ascending aorta and the right atrium are cannulated for cardiopulmonary bypass.  Adequate heparinization is verified.    A retrograde cardioplegia cannula is placed through the right atrium into the coronary sinus.  The entire pre-bypass portion of the operation was notable for stable hemodynamics.  Cardiopulmonary bypass was begun and the surface of the heart is inspected. Distal target vessels are selected for coronary artery bypass grafting. A cardioplegia cannula is placed in the ascending aorta.  A temperature probe was placed in the interventricular septum.  The patient is allowed to cool passively to 34C systemic temperature.  The aortic cross clamp is applied and cold blood cardioplegia is delivered initially in an antegrade fashion through the aortic root.   Supplemental cardioplegia is given  retrograde through the coronary sinus catheter.  Iced saline slush is  applied for topical hypothermia.  The initial cardioplegic arrest is rapid with early diastolic arrest.  Repeat doses of cardioplegia are administered intermittently throughout the entire cross clamp portion of the operation through the aortic root,  through the coronary sinus catheter, and through subsequently placed vein grafts in order to maintain completely flat electrocardiogram.   Coronary Artery Bypass Grafting:  The  posterior descending branch of the right coronary artery and the posterolateral artery branch were grafted using a reversed saphenous vein graft in a sequenced fashion.  At the site of distal anastomosis the target vessel was good quality and measured approximately 1.5 mm in diameter. The 5 diagonal branch of the left anterior descending coronary artery was grafted using a reversed saphenous vein graft in an end-to-side fashion.  At the site of distal anastomosis the target vessel was good quality and measured approximately 1.5 mm in diameter.  The distal left anterior coronary artery was grafted with the left internal mammary artery in an end-to-side fashion.  At the site of distal anastomosis the target vessel was good quality and measured approximately 1.5 mm in diameter. Anastomotic patency and runoff was confirmed with indocyanine green fluorescence imaging (SPY).   All proximal vein graft anastomoses were placed directly to the ascending aorta prior to removal of the aortic cross clamp.  De-airing procedures were performed and the aortic cross-clamp was removed.   Procedure Completion:  All proximal and distal coronary anastomoses were inspected for hemostasis and appropriate graft orientation. Epicardial pacing wires are fixed to the right ventricular outflow tract and to the right atrial appendage. The patient is rewarmed to 37C temperature. The patient is weaned and disconnected from cardiopulmonary  bypass.  The patient's rhythm at separation from bypass was normal sinus.  The patient was weaned from cardiopulmonary bypass with moderate inotropic support.   Followup transesophageal echocardiogram performed after separation from bypass revealed improved LV function  The aortic and venous cannula were removed uneventfully. Protamine was administered to reverse the anticoagulation. The mediastinum and pleural space were inspected for hemostasis and irrigated with saline solution. The mediastinum and left pleural space were drained using fluted chest tubes placed through separate stab incisions inferiorly.  The soft tissues anterior to the aorta were reapproximated loosely. The sternum is closed with double strength sternal wire. The soft tissues anterior to the sternum were closed in multiple layers and the skin is closed with a running subcuticular skin closure.  The post-bypass portion of the operation was notable for stable rhythm and hemodynamics.  No blood products were administered during the operation.   Disposition:  The patient tolerated the procedure well and is transported to the surgical intensive care in stable condition. There are no intraoperative complications. All sponge instrument and needle counts are verified correct at completion of the operation.    Ronald Fling, MD 11/05/2020 3:10 PM

## 2020-11-11 ENCOUNTER — Encounter (INDEPENDENT_AMBULATORY_CARE_PROVIDER_SITE_OTHER): Payer: Self-pay

## 2020-11-11 ENCOUNTER — Other Ambulatory Visit: Payer: Self-pay

## 2020-11-11 DIAGNOSIS — Z4802 Encounter for removal of sutures: Secondary | ICD-10-CM

## 2020-11-12 ENCOUNTER — Other Ambulatory Visit: Payer: Self-pay | Admitting: Family Medicine

## 2020-11-12 DIAGNOSIS — E1140 Type 2 diabetes mellitus with diabetic neuropathy, unspecified: Secondary | ICD-10-CM

## 2020-11-12 DIAGNOSIS — I2510 Atherosclerotic heart disease of native coronary artery without angina pectoris: Secondary | ICD-10-CM

## 2020-11-12 DIAGNOSIS — Z125 Encounter for screening for malignant neoplasm of prostate: Secondary | ICD-10-CM

## 2020-11-12 DIAGNOSIS — D649 Anemia, unspecified: Secondary | ICD-10-CM

## 2020-11-12 DIAGNOSIS — Z1159 Encounter for screening for other viral diseases: Secondary | ICD-10-CM

## 2020-11-12 DIAGNOSIS — E1169 Type 2 diabetes mellitus with other specified complication: Secondary | ICD-10-CM

## 2020-11-16 NOTE — Progress Notes (Signed)
Cardiology Office Note    Date:  11/18/2020   ID:  Ronald Roth, DOB [birth year], MRN [id number]  PCP:  Smitty Cords, DO  Cardiologist:  Julien Nordmann, MD  Electrophysiologist:  None   Chief Complaint: Hospital follow up  History of Present Illness:   Ronald Roth is a 71 y.o. male with history of CAD status with remote MI approximately 10 years prior status post PCI status post four-vessel CABG on 10/31/2020 with LIMA to LAD, SVG to diagonal, SVG to rPDA and rPLB, HFrEF secondary to ICM, DM2, HTN, HLD, strong family history of CAD, and prior tobacco use who presents for hospital follow-up following recent admission to [facility] for CABG from 6/17 through 6/21 for CABG.  He was seen as a new patient on 09/16/2020 to establish care given his history of CAD.  At that time, he noted exertional fatigue, shortness of breath, and angina.  In this setting, he underwent Lexiscan MPI on 09/25/2020 which showed no significant ischemia with a large perfusion defect in the mid to distal anteroseptal wall, mid to distal inferior wall, and apical region with associated hypokinesis, EF 32%.  CT attenuation corrected images showed moderate aortic atherosclerosis and three-vessel coronary artery calcification.  Overall, this was a moderate risk scan given depressed EF and a large defect.  Subsequent echo on 10/08/2020 showed an EF of 25 to 30%, global hypokinesis, mild LVH, mildly reduced RV systolic function with normal RV cavity size, and mild mitral regurgitation.  Diagnostic LHC on 10/20/2020 showed severe three-vessel CAD including a long segment of diffuse mid LAD disease of 50 to 90% stenosis with moderate to severe associated calcification and occlusion of branch of D1, small LCx with CTO of OM1, and a large RCA with 70 to 90% tandem lesions involving the rPDA and 60% rPL stenosis.  The previously placed RPL stent was patent with mild in-stent restenosis of approximately 20%.  Upper normal LV filling  pressure at approximately 15 mmHg.  Given severe multivessel disease he was referred to CVTS for consideration of CABG.  He was evaluated by CVTS on 10/27/2020 with recommendation to proceed with CABG.  Preoperative carotid duplex showed 40 to 59% RICA stenosis with no significant LICA stenosis.  He underwent four-vessel CABG on 10/31/2020.  Postoperative course was notable for brief episode of high-grade AV block, volume overload with symptomatic improvement with diuresis, AKI, and expected ABL anemia.  He comes in accompanied by his wife today and is doing well from a cardiac perspective.  No chest pain, dyspnea, dizziness, presyncope, or syncope.  He continues to advance his activity as tolerated.  He is appetite is good.  He did have an episode of brief palpitations with associated flushing on 7/4.  He also reports an isolated heart rate reading on his watch of 157 bpm.  No significant bradycardic rates.  Heart rates at home have been predominantly in the 80s to 90s bpm, per watch.  No lower extremity swelling, abdominal distention, or orthopnea.  He is tolerating cardiac meds without issues.  He does drink greater than 2 L of fluid per day and watches his salt intake.  He follows up with CVTS later this month.  He will be participating in cardiac rehab.   Labs independently reviewed: 10/2020 - potassium 4.2, BUN 19, SCr 1.13, HGB 12.7, PLT 194, magnesium 2.1, A1c 7.3, albumin 4.0, AST/ALT normal 09/2020 - TC 120, TG 184, HDL 24, LDL 65 10/2018 - TSH normal  Past Medical History:  Diagnosis Date   Anxiety    Coronary artery disease    Diabetes mellitus without complication (HCC)    GERD (gastroesophageal reflux disease)    Heart attack (HCC) 2012   Hypertension    Sciatica     Past Surgical History:  Procedure Laterality Date   CARDIAC CATHETERIZATION     CORONARY ANGIOPLASTY WITH STENT PLACEMENT  2012   x 1   CORONARY ARTERY BYPASS GRAFT N/A 10/31/2020   Procedure: CORONARY ARTERY BYPASS  GRAFTING (CABG)x 4 ON CARDIOPULMONARY BYPASS. LEFT INTERNAL MAMMARY ARTERY AND RIGHT GREATER SAPHENOUS VEIN. LIMA TO LAD, SVG TO DIAG., SVG TO PDA  AND PLB;  Surgeon: Linden Dolin, MD;  Location: [facility] OR;  Service: Open Heart Surgery;  Laterality: N/A;   ENDOVEIN HARVEST OF GREATER SAPHENOUS VEIN Right 10/31/2020   Procedure: ENDOVEIN HARVEST OF GREATER SAPHENOUS VEIN;  Surgeon: Linden Dolin, MD;  Location: [facility] OR;  Service: Open Heart Surgery;  Laterality: Right;   LEFT HEART CATH AND CORONARY ANGIOGRAPHY N/A 10/20/2020   Procedure: LEFT HEART CATH AND CORONARY ANGIOGRAPHY;  Surgeon: Yvonne Kendall, MD;  Location: [facility] INVASIVE CV LAB;  Service: Cardiovascular;  Laterality: N/A;   Ruptured disc     Sciatica    STENT PLACEMENT VASCULAR ([facility] HX)  2012   TEE WITHOUT CARDIOVERSION N/A 10/31/2020   Procedure: TRANSESOPHAGEAL ECHOCARDIOGRAM (TEE);  Surgeon: Linden Dolin, MD;  Location: [facility] OR;  Service: Open Heart Surgery;  Laterality: N/A;    Current Medications: Current Meds  Medication Sig   acetaminophen (TYLENOL) 500 MG tablet Take 2 tablets (1,000 mg total) by mouth every 6 (six) hours as needed.   aspirin EC 81 MG tablet Take 81 mg by mouth in the morning. Swallow whole.   atorvastatin (LIPITOR) 80 MG tablet Take 80 mg by mouth in the morning.   diphenhydrAMINE (BENADRYL) 25 MG tablet Take 75 mg by mouth at bedtime.   ezetimibe (ZETIA) 10 MG tablet Take 1 tablet (10 mg total) by mouth in the morning.   fenofibrate (TRICOR) 145 MG tablet Take 145 mg by mouth in the morning.   furosemide (LASIX) 20 MG tablet Take 1 tablet (20 mg total) by mouth as directed. Take one tablet daily for 3 days.   JARDIANCE 25 MG TABS tablet Take 1 tablet (25 mg total) by mouth daily before breakfast.   metoprolol succinate (TOPROL-XL) 50 MG 24 hr tablet Take 1 tablet (50 mg total) by mouth daily. Take with or immediately following a meal.   nitroGLYCERIN (NITROSTAT) 0.4 MG SL tablet Place 1 tablet (0.4 mg  total) under the tongue every 5 (five) minutes x 3 doses as needed for chest pain.   omeprazole (PRILOSEC) 20 MG capsule Take 20 mg by mouth in the morning.   oxyCODONE (OXY IR/ROXICODONE) 5 MG immediate release tablet Take 1 tablet (5 mg total) by mouth every 4 (four) hours as needed for severe pain.   OZEMPIC, 0.25 OR 0.5 MG/DOSE, 2 MG/1.5ML SOPN Inject 0.5 mg into the skin every Monday.   sacubitril-valsartan (ENTRESTO) 24-26 MG Take 1 tablet by mouth 2 (two) times daily.   [START ON 12/19/2020] sacubitril-valsartan (ENTRESTO) 24-26 MG Take 1 tablet by mouth 2 (two) times daily.   Vitamin D, Ergocalciferol, (DRISDOL) 1.25 MG (50000 UNIT) CAPS capsule Take 50,000 Units by mouth 2 (two) times a week. Sundays & Thursdays   [DISCONTINUED] sacubitril-valsartan (ENTRESTO) 24-26 MG Take 1 tablet by mouth 2 (two) times daily.    Allergies:  Patient has no known allergies.   Social History   Socioeconomic History   Marital status: Married    Spouse name: Not on file   Number of children: Not on file   Years of education: Not on file   Highest education level: Not on file  Occupational History   Not on file  Tobacco Use   Smoking status: Former    Packs/day: 1.00    Years: 30.00    Pack years: 30.00    Types: Cigarettes    Quit date: 04/18/1991    Years since quitting: 29.6   Smokeless tobacco: Never  Vaping Use   Vaping Use: Never used  Substance and Sexual Activity   Alcohol use: Yes    Comment: Occasionally    Drug use: Never   Sexual activity: Not on file  Other Topics Concern   Not on file  Social History Narrative   Not on file   Social Determinants of Health   Financial Resource Strain: Not on file  Food Insecurity: Not on file  Transportation Needs: Not on file  Physical Activity: Not on file  Stress: Not on file  Social Connections: Not on file     Family History:  The patient's family history includes Aortic aneurysm in his father; Arrhythmia in his mother;  Hyperlipidemia in his sister; Hypertension in his sister; Pneumonia in his mother.  ROS:   Review of Systems  Constitutional:  Positive for malaise/fatigue. Negative for chills, diaphoresis, fever and weight loss.  HENT:  Negative for congestion.   Eyes:  Negative for discharge and redness.  Respiratory:  Negative for cough, sputum production, shortness of breath and wheezing.   Cardiovascular:  Positive for palpitations. Negative for chest pain, orthopnea, claudication, leg swelling and PND.  Gastrointestinal:  Negative for abdominal pain, heartburn, nausea and vomiting.  Musculoskeletal:  Negative for falls and myalgias.  Skin:  Negative for rash.  Neurological:  Negative for dizziness, tingling, tremors, sensory change, speech change, focal weakness, loss of consciousness and weakness.  Endo/Heme/Allergies:  Does not bruise/bleed easily.  Psychiatric/Behavioral:  Negative for substance abuse. The patient is not nervous/anxious.   All other systems reviewed and are negative.   EKGs/Labs/Other Studies Reviewed:    Studies reviewed were summarized above. The additional studies were reviewed today:  Lexiscan MPI 09/25/2020: Pharmacological myocardial perfusion imaging study with no significant ischemia Large perfusion defect in the mid to distal anteroseptal wall, mid to distal inferior wall and apical region Hypokinesis of the region detailed above,, EF estimated at 32% No EKG changes concerning for ischemia at peak stress or in recovery. CT attenuation correction images with moderate aortic atherosclerosis, three-vessel coronary calcification Moderate risk scan given depressed ejection fraction, large defect __________  2D echo 10/08/2020: 1. Left ventricular ejection fraction, by estimation, is 25 to 30%. The  left ventricle has severely decreased function. The left ventricle  demonstrates global hypokinesis. There is mild left ventricular  hypertrophy. Left ventricular diastolic  parameters   are indeterminate.   2. Right ventricular systolic function is mildly reduced. The right  ventricular size is normal.   3. The mitral valve is normal in structure. Mild mitral valve  regurgitation.   4. The aortic valve was not well visualized. Aortic valve regurgitation  is not visualized.  __________  LHC 10/20/2020: Conclusions: Severe three-vessel coronary artery disease, including long segment of diffuse mid LAD disease of 50-90% stenosis with moderate to severe associated calcification and occlusion of branch of D1, small LCx with  chronic total occlusion of OM1, and large RCA with 70-90% tandem lesions involving rPDA and 60% rPL stenosis. Patient rPL stent with mild in-stent restenosis (~20%). Upper normal left ventricular filling pressure (LVEDP ~15 mmHg).   Recommendations: Outpatient cardiac surgery consultation for CABG, given three-vessel coronary artery disease, reduced LVEF, and diabetes mellitus. Continue current heart failure and antianginal regimen. Aggressive secondary prevention. __________  Pre-CABG vascular studies 10/29/2020: Summary:  Right Carotid: Velocities in the right ICA are consistent with a 40-59%                 stenosis.   Left Carotid: Velocities in the left ICA are consistent with a 1-39%  stenosis.  Vertebrals:  Bilateral vertebral arteries demonstrate antegrade flow. Left               vertebral artery demonstrates high resistant flow.  Subclavians: Normal flow hemodynamics were seen in bilateral subclavian               arteries.   Right ABI: Resting right ankle-brachial index is within normal range. No  evidence of significant right lower extremity arterial disease.  Left ABI: Resting left ankle-brachial index is within normal range. No  evidence of significant left lower extremity arterial disease.  Right Upper Extremity: Doppler waveforms remain within normal limits with  right radial compression. Doppler waveform obliterate  with right ulnar  compression.  Left Upper Extremity: Doppler waveform obliterate with left radial  compression. Doppler waveforms remain within normal limits with left ulnar  compression.  __________  Intraoperative TEE 10/31/2020: POST-OP IMPRESSIONS  limited Post-CPB examination: The patient separated easily from CPB.  - Left Ventricle: The left ventricle shows improvement in global  contractility.  There is some recovery of contractility in the inferior wall, although it  remains hypokinetic. The septal area remains akinetic. Visually, the EF  has  imroved to approximately 30%.  - Right Ventricle: The right ventricular function appears normal,  unchanged from  pre-bypass images.  - Aortic Valve: The aortic valve function appears normal and unchanged  from  pre-bypass images.  - Mitral Valve: The mitral valve function appears normal, and unchanged  from  pre-bypass images.  - Tricuspid Valve: The tricuspid valve function appears unchanged from  pre-bypass images.    EKG:  EKG is ordered today.  The EKG ordered today demonstrates NSR, 93 bpm, first-degree AV block which is improved from prior tracing, LBBB (known)  Recent Labs: 10/29/2020: ALT 17 11/01/2020: Magnesium 2.1 11/03/2020: Hemoglobin 12.7; Platelets 194 11/04/2020: BUN 19; Creatinine, Ser 1.13; Potassium 4.2; Sodium 136  Recent Lipid Panel    Component Value Date/Time   CHOL 120 10/09/2020 0943   TRIG 184 (H) 10/09/2020 0943   HDL 24 (L) 10/09/2020 0943   CHOLHDL 5.0 10/09/2020 0943   LDLCALC 65 10/09/2020 0943    PHYSICAL EXAM:    VS:  BP 130/70 (BP Location: Left Arm, Patient Position: Sitting, Cuff Size: Normal)   Pulse 93   Ht 5' 10.5" (1.791 m)   Wt 194 lb (88 kg)   SpO2 98%   BMI 27.44 kg/m   BMI: Body mass index is 27.44 kg/m.  Physical Exam Vitals reviewed.  Constitutional:      Appearance: He is well-developed.  HENT:     Head: Normocephalic and atraumatic.  Eyes:     General:         Right eye: No discharge.        Left eye: No discharge.  Neck:  Vascular: No JVD.  Cardiovascular:     Rate and Rhythm: Normal rate and regular rhythm.     Pulses:          Posterior tibial pulses are 2+ on the right side and 2+ on the left side.     Heart sounds: Normal heart sounds, S1 normal and S2 normal. Heart sounds not distant. No midsystolic click and no opening snap. No murmur heard.   No friction rub.     Comments: Well-healing midline sternotomy surgical incision site without signs of infection Pulmonary:     Effort: Pulmonary effort is normal. No respiratory distress.     Breath sounds: Examination of the right-lower field reveals rales. Rales present. No decreased breath sounds, wheezing or rhonchi.  Chest:     Chest wall: No tenderness.  Abdominal:     General: There is no distension.     Palpations: Abdomen is soft.     Tenderness: There is no abdominal tenderness.  Musculoskeletal:     Cervical back: Normal range of motion.     Right lower leg: No edema.     Left lower leg: No edema.     Comments: Well-healing right lower extremity SVG harvest site without signs of infection  Skin:    General: Skin is warm and dry.     Nails: There is no clubbing.  Neurological:     Mental Status: He is alert and oriented to person, place, and time.  Psychiatric:        Speech: Speech normal.        Behavior: Behavior normal.        Thought Content: Thought content normal.        Judgment: Judgment normal.    Wt Readings from Last 3 Encounters:  11/18/20 194 lb (88 kg)  11/03/20 212 lb 7.4 oz (96.4 kg)  10/29/20 204 lb 1.6 oz (92.6 kg)     ASSESSMENT & PLAN:   CAD status post PCI status post CABG without angina: He is doing well without symptoms concerning for angina.  Continue secondary prevention and medical therapy including ASA 81 mg, atorvastatin 80 mg, ezetimibe 10 mg, fenofibrate 145 mg, metoprolol succinate 50 mg, and as needed SL NTG 0.4 mg.  Follow-up with  CVTS as directed.  No indication for further ischemic testing at this time.  Check CBC and BMP.  He will be participating in cardiac rehab.  HFrEF secondary to ICM: He is well compensated with faint crackles along the right base with NYHA class I symptoms.  His weight is down 8 pounds today when compared to his last in person clinic visit, likely in the postoperative setting.  He will take an additional Lasix 20 mg daily for 3 days followed by as needed thereafter.  Add Entresto 24/26 mg twice daily.  Continue Toprol-XL and Jardiance.  When he is seen in follow-up, look to add spironolactone as vital signs and labs allow.  Check BMP today and again in 1 week after initiating Entresto therapy.  We will look to repeat a limited echo in approximately 3 months time following surgical revascularization and optimization of GDMT.  Should his EF remain less than 35% at that time we will refer him to EP for consideration of CRT-D in the context of persistent cardiomyopathy and underlying LBBB.  CHF education.  Postoperative high-grade AV block/palpitations: No symptoms of syncope.  Place ZIO patch.  He does remain on Toprol-XL as outlined above given his cardiomyopathy.  Check BMP  and TSH.  HTN: Blood pressure is reasonably controlled in the office this morning.  Continue medical therapy as outlined above including Toprol-XL and addition of Entresto.  Low-sodium diet recommended.  HLD: LDL 65 from 09/2020 with a triglyceride of 184 at that time.  LFT normal in 10/2020.  He remains on atorvastatin 80 mg, ezetimibe, and fenofibrate.  If triglycerides remain elevated in follow-up, may need to consider addition of Vascepa in place of Tricor.  DM2: A1c 7.3.  Follow-up with PCP as directed.  LBBB/first-degree AV block: No symptoms of syncope.  He remains on Toprol-XL as outlined above.  Disposition: F/u with Dr. [name] or an APP in 1 month.   Medication Adjustments/Labs and Tests Ordered: Current medicines are  reviewed at length with the patient today.  Concerns regarding medicines are outlined above. Medication changes, Labs and Tests ordered today are summarized above and listed in the Patient Instructions accessible in Encounters.   Signed, Eula Listen, PA-C 11/18/2020 12:26 PM     [facility] [address] [place], [place] [address] [phone]

## 2020-11-18 ENCOUNTER — Ambulatory Visit (INDEPENDENT_AMBULATORY_CARE_PROVIDER_SITE_OTHER): Payer: Managed Care, Other (non HMO)

## 2020-11-18 ENCOUNTER — Other Ambulatory Visit: Payer: Self-pay

## 2020-11-18 ENCOUNTER — Ambulatory Visit (INDEPENDENT_AMBULATORY_CARE_PROVIDER_SITE_OTHER): Payer: Managed Care, Other (non HMO) | Admitting: Physician Assistant

## 2020-11-18 ENCOUNTER — Encounter: Payer: Self-pay | Admitting: Physician Assistant

## 2020-11-18 VITALS — BP 130/70 | HR 93 | Ht 70.5 in | Wt 194.0 lb

## 2020-11-18 DIAGNOSIS — Z951 Presence of aortocoronary bypass graft: Secondary | ICD-10-CM

## 2020-11-18 DIAGNOSIS — I2510 Atherosclerotic heart disease of native coronary artery without angina pectoris: Secondary | ICD-10-CM

## 2020-11-18 DIAGNOSIS — I255 Ischemic cardiomyopathy: Secondary | ICD-10-CM | POA: Diagnosis not present

## 2020-11-18 DIAGNOSIS — R002 Palpitations: Secondary | ICD-10-CM

## 2020-11-18 DIAGNOSIS — I5020 Unspecified systolic (congestive) heart failure: Secondary | ICD-10-CM

## 2020-11-18 DIAGNOSIS — E1140 Type 2 diabetes mellitus with diabetic neuropathy, unspecified: Secondary | ICD-10-CM

## 2020-11-18 DIAGNOSIS — I200 Unstable angina: Secondary | ICD-10-CM

## 2020-11-18 DIAGNOSIS — I447 Left bundle-branch block, unspecified: Secondary | ICD-10-CM

## 2020-11-18 DIAGNOSIS — I4430 Unspecified atrioventricular block: Secondary | ICD-10-CM

## 2020-11-18 DIAGNOSIS — I10 Essential (primary) hypertension: Secondary | ICD-10-CM

## 2020-11-18 DIAGNOSIS — E785 Hyperlipidemia, unspecified: Secondary | ICD-10-CM

## 2020-11-18 MED ORDER — SACUBITRIL-VALSARTAN 24-26 MG PO TABS: 1.0000 | ORAL_TABLET | Freq: Two times a day (BID) | ORAL | 3 refills | Status: DC

## 2020-11-18 MED ORDER — FUROSEMIDE 20 MG PO TABS: 20.0000 mg | ORAL_TABLET | ORAL | 0 refills | Status: DC

## 2020-11-18 MED ORDER — SACUBITRIL-VALSARTAN 24-26 MG PO TABS: 1.0000 | ORAL_TABLET | Freq: Two times a day (BID) | ORAL | 0 refills | Status: DC

## 2020-11-18 NOTE — Progress Notes (Signed)
Called pharmacy to review medication orders sent to them. Sent in 30 day for one month and then 90 day sent to mail order. They updated their records.

## 2020-11-18 NOTE — Patient Instructions (Addendum)
Medication Instructions:  Your physician has recommended you make the following change in your medication:   START Entresto 24-26 mg take 1 tablet twice a day START Furosamide 20 mg once daily for 3 days  *If you need a refill on your cardiac medications before your next appointment, please call your pharmacy*   Lab Work: CBC, BMET, and TSH today  BMET In one week over at the [employer] at [facility] then go to 1st desk on the right to check in, past the screening table. No appointment needed for this. Lab hours: Monday- Friday (7:30 am- 5:30 pm)  If you have labs (blood work) drawn today and your tests are completely normal, you will receive your results only by: MyChart Message (if you have MyChart) OR A paper copy in the mail If you have any lab test that is abnormal or we need to change your treatment, we will call you to review the results.   Testing/Procedures: Your physician has recommended that you wear a Zio monitor for 14 days.   This monitor is a medical device that records the heart's electrical activity. Doctors most often use these monitors to diagnose arrhythmias. Arrhythmias are problems with the speed or rhythm of the heartbeat. The monitor is a small device applied to your chest. You can wear one while you do your normal daily activities. While wearing this monitor if you have any symptoms to push the button and record what you felt. Once you have worn this monitor for the period of time provider prescribed (Usually 14 days), you will return the monitor device in the postage paid box. Once it is returned they will download the data collected and provide [place] with a report which the provider will then review and we will call you with those results. Important tips:  Avoid showering during the first 24 hours of wearing the monitor. Avoid excessive sweating to help maximize wear time. Do not submerge the device, no hot tubs, and no swimming pools. Keep any lotions or oils  away from the patch. After 24 hours you may shower with the patch on. Take brief showers with your back facing the shower head.  Do not remove patch once it has been placed because that will interrupt data and decrease adhesive wear time. Push the button when you have any symptoms and write down what you were feeling. Once you have completed wearing your monitor, remove and place into box which has postage paid and place in your outgoing mailbox.  If for some reason you have misplaced your box then call our office and we can provide another box and/or mail it off for you.      Follow-Up: At [facility], you and your health needs are our priority.  As part of our continuing mission to provide you with exceptional heart care, we have created designated Provider Care Teams.  These Care Teams include your primary Cardiologist (physician) and Advanced Practice Providers (APPs -  Physician Assistants and Nurse Practitioners) who all work together to provide you with the care you need, when you need it.   Your next appointment:   1 month(s)  The format for your next appointment:   In Person  Provider:   Julien Nordmann, MD or Eula Listen, PA-C

## 2020-11-19 ENCOUNTER — Telehealth: Payer: Self-pay | Admitting: Cardiovascular Disease

## 2020-11-19 ENCOUNTER — Other Ambulatory Visit: Payer: Self-pay | Admitting: *Deleted

## 2020-11-19 LAB — CBC
Hematocrit: 41 % (ref 37.5–51.0)
Hemoglobin: 13.8 g/dL (ref 13.0–17.7)
MCH: 30.7 pg (ref 26.6–33.0)
MCHC: 33.7 g/dL (ref 31.5–35.7)
MCV: 91 fL (ref 79–97)
Platelets: 571 10*3/uL — ABNORMAL HIGH (ref 150–450)
RBC: 4.49 x10E6/uL (ref 4.14–5.80)
RDW: 13.4 % (ref 11.6–15.4)
WBC: 10.3 10*3/uL (ref 3.4–10.8)

## 2020-11-19 LAB — TSH: TSH: 1.41 u[IU]/mL (ref 0.450–4.500)

## 2020-11-19 LAB — BASIC METABOLIC PANEL
BUN/Creatinine Ratio: 15 (ref 10–24)
BUN: 18 mg/dL (ref 8–27)
CO2: 21 mmol/L (ref 20–29)
Calcium: 9.7 mg/dL (ref 8.6–10.2)
Chloride: 100 mmol/L (ref 96–106)
Creatinine, Ser: 1.24 mg/dL (ref 0.76–1.27)
Glucose: 114 mg/dL — ABNORMAL HIGH (ref 65–99)
Potassium: 5.3 mmol/L — ABNORMAL HIGH (ref 3.5–5.2)
Sodium: 136 mmol/L (ref 134–144)
eGFR: 62 mL/min/{1.73_m2} (ref >59)

## 2020-11-19 MED ORDER — SACUBITRIL-VALSARTAN 24-26 MG PO TABS: 1.0000 | ORAL_TABLET | Freq: Two times a day (BID) | ORAL | 0 refills | Status: DC

## 2020-11-19 MED ORDER — SACUBITRIL-VALSARTAN 24-26 MG PO TABS
1.0000 | ORAL_TABLET | Freq: Two times a day (BID) | ORAL | 0 refills | Status: DC
Start: 2020-11-19 — End: 2020-11-19

## 2020-11-19 NOTE — Telephone Encounter (Signed)
Pt given instructions to go to [email or web address] and print free 30 day trail coupon code. No coupon cards are available in the office at this time. Pt will take coupon card to his local pharmacy for free 30 day refill.  Entresto 24-26 mg bid. #60 sent to [facility] drug store.

## 2020-11-19 NOTE — Telephone Encounter (Signed)
Patient calling the office for samples of medication:   1.  What medication and dosage are you requesting samples for?  Entresto 24-26 mg po q d   2.  Are you currently out of this medication? Yes shipment will be here 7/14

## 2020-11-19 NOTE — Telephone Encounter (Signed)
Patient calling  States insurance will pay for [facility] as long as it is sent to [facility] for a 90 day supply Please review and resend

## 2020-11-19 NOTE — Telephone Encounter (Signed)
Spoke with patient and he reports that insurance will not pay for 30 day prescription and requested that I send 90 day supply to [facility]. Updated orders and patient was appreciative for the call. Advised that he should call back if he should have any problems.

## 2020-11-20 LAB — LIPID PANEL
Chol/HDL Ratio: 4.5 ratio (ref 0.0–5.0)
Cholesterol, Total: 135 mg/dL (ref 100–199)
HDL: 30 mg/dL — ABNORMAL LOW (ref >39)
LDL Chol Calc (NIH): 79 mg/dL (ref 0–99)
Triglycerides: 149 mg/dL (ref 0–149)
VLDL Cholesterol Cal: 26 mg/dL (ref 5–40)

## 2020-11-20 LAB — CBC WITH DIFFERENTIAL/PLATELET
Basophils Absolute: 0.1 10*3/uL (ref 0.0–0.2)
Basos: 1 %
EOS (ABSOLUTE): 0.3 10*3/uL (ref 0.0–0.4)
Eos: 3 %
Hematocrit: 45.3 % (ref 37.5–51.0)
Hemoglobin: 14.7 g/dL (ref 13.0–17.7)
Immature Grans (Abs): 0 10*3/uL (ref 0.0–0.1)
Immature Granulocytes: 0 %
Lymphocytes Absolute: 3.9 10*3/uL — ABNORMAL HIGH (ref 0.7–3.1)
Lymphs: 35 %
MCH: 29.7 pg (ref 26.6–33.0)
MCHC: 32.5 g/dL (ref 31.5–35.7)
MCV: 92 fL (ref 79–97)
Monocytes Absolute: 0.8 10*3/uL (ref 0.1–0.9)
Monocytes: 7 %
Neutrophils Absolute: 5.8 10*3/uL (ref 1.4–7.0)
Neutrophils: 54 %
Platelets: 575 10*3/uL — ABNORMAL HIGH (ref 150–450)
RBC: 4.95 x10E6/uL (ref 4.14–5.80)
RDW: 13.4 % (ref 11.6–15.4)
WBC: 10.9 10*3/uL — ABNORMAL HIGH (ref 3.4–10.8)

## 2020-11-20 LAB — COMPREHENSIVE METABOLIC PANEL
ALT: 9 IU/L (ref 0–44)
AST: 9 IU/L (ref 0–40)
Albumin/Globulin Ratio: 1.7 (ref 1.2–2.2)
Albumin: 4.6 g/dL (ref 3.7–4.7)
Alkaline Phosphatase: 77 IU/L (ref 44–121)
BUN/Creatinine Ratio: 15 (ref 10–24)
BUN: 19 mg/dL (ref 8–27)
Bilirubin Total: 0.4 mg/dL (ref 0.0–1.2)
CO2: 21 mmol/L (ref 20–29)
Calcium: 9.8 mg/dL (ref 8.6–10.2)
Chloride: 100 mmol/L (ref 96–106)
Creatinine, Ser: 1.31 mg/dL — ABNORMAL HIGH (ref 0.76–1.27)
Globulin, Total: 2.7 g/dL (ref 1.5–4.5)
Glucose: 144 mg/dL — ABNORMAL HIGH (ref 65–99)
Potassium: 4.7 mmol/L (ref 3.5–5.2)
Sodium: 140 mmol/L (ref 134–144)
Total Protein: 7.3 g/dL (ref 6.0–8.5)
eGFR: 58 mL/min/{1.73_m2} — ABNORMAL LOW (ref >59)

## 2020-11-20 LAB — HEMOGLOBIN A1C
Est. average glucose Bld gHb Est-mCnc: 163 mg/dL
Hgb A1c MFr Bld: 7.3 % — ABNORMAL HIGH (ref 4.8–5.6)

## 2020-11-20 LAB — TSH: TSH: 1.11 u[IU]/mL (ref 0.450–4.500)

## 2020-11-20 LAB — HEPATITIS C ANTIBODY: Hep C Virus Ab: <0.1 s/co ratio (ref 0.0–0.9)

## 2020-11-20 LAB — PSA: Prostate Specific Ag, Serum: 0.9 ng/mL (ref 0.0–4.0)

## 2020-11-21 ENCOUNTER — Encounter: Payer: Self-pay | Admitting: Family Medicine

## 2020-11-21 ENCOUNTER — Ambulatory Visit (INDEPENDENT_AMBULATORY_CARE_PROVIDER_SITE_OTHER): Payer: Medicare Other | Admitting: Family Medicine

## 2020-11-21 ENCOUNTER — Other Ambulatory Visit: Payer: Self-pay

## 2020-11-21 VITALS — BP 109/63 | HR 91 | Ht 70.5 in | Wt 195.6 lb

## 2020-11-21 DIAGNOSIS — I2510 Atherosclerotic heart disease of native coronary artery without angina pectoris: Secondary | ICD-10-CM

## 2020-11-21 DIAGNOSIS — E1169 Type 2 diabetes mellitus with other specified complication: Secondary | ICD-10-CM

## 2020-11-21 DIAGNOSIS — E1140 Type 2 diabetes mellitus with diabetic neuropathy, unspecified: Secondary | ICD-10-CM

## 2020-11-21 DIAGNOSIS — Z951 Presence of aortocoronary bypass graft: Secondary | ICD-10-CM

## 2020-11-21 DIAGNOSIS — E785 Hyperlipidemia, unspecified: Secondary | ICD-10-CM

## 2020-11-21 NOTE — Progress Notes (Signed)
Subjective:    Patient ID: Ronald Roth, male    DOB: 14-Jun-1950, 70 y.o.   MRN: [id number]  Ronald Roth is a 70 y.o. male presenting on 11/21/2020 for Diabetes   HPI  CHRONIC DM, Type 2: Hyperlipidemia History of DM since 2013, previously managed by Endocrinology Last visit with me 3 month ago, he was having intolerance to metformin, discontinued on Synjardy and switch to Jardiance monotherapy and Ozempic instead of Bydureon Seems to be doing very well. Recent update A1c stable at 7.3 Meds: Ozempic 0.73m weekly inj, Jardiance 250mdaily Currently on ACEi Resolved diarrhea Denies hypoglycemia, polyuria, visual changes, numbness or tingling.   CAD s/p CABG Prior MI in 2013, angioplasty, stent. [facility], Heart Cath and ultimately coordinated with CVTS / Cardiology for CABG. Reviewed records. His medications have been managed by Cardiology, he has had repeat ECHO with reduced LVEF 35%, awaiting repeat ECHO in 3 months now for improvement in function. Will pursue cardiac rehab next. He is on Entresto now. Lasix diuretic regimen, recently with some mild crackle in lungs. Zio patch in place  Health Maintenance: Will return in future when ready for Cologuard screening.  [facility] 11/21/2020 08/22/2020  Decreased Interest 0 0  Down, Depressed, Hopeless 0 0  PHQ - 2 Score 0 0  Altered sleeping 0 -  Tired, decreased energy 1 -  Change in appetite 0 -  Feeling bad or failure about yourself  0 -  Trouble concentrating 0 -  Moving slowly or fidgety/restless 0 -  Suicidal thoughts 0 -  PHQ-9 Score 1 -  Difficult doing work/chores Not difficult at all -   GAD 7 : Generalized Anxiety Score 11/21/2020  Nervous, Anxious, on Edge 0  Control/stop worrying 0  Worry too much - different things 0  Trouble relaxing 0  Restless 0  Easily annoyed or irritable 0  Afraid - awful might happen 0  Total GAD 7 Score 0  Anxiety  Difficulty Not difficult at all      Social History   Tobacco Use   Smoking status: Former    Packs/day: 1.00    Years: 30.00    Pack years: 30.00    Types: Cigarettes    Quit date: 04/18/1991    Years since quitting: 29.6   Smokeless tobacco: Never  Vaping Use   Vaping Use: Never used  Substance Use Topics   Alcohol use: Yes    Comment: Occasionally    Drug use: Never    Review of Systems Per HPI unless specifically indicated above     Objective:    BP 109/63   Pulse 91   Ht 5' 10.5" (1.791 m)   Wt 195 lb 9.6 oz (88.7 kg)   SpO2 99%   BMI 27.67 kg/m   Wt Readings from Last 3 Encounters:  11/21/20 195 lb 9.6 oz (88.7 kg)  11/18/20 194 lb (88 kg)  11/03/20 212 lb 7.4 oz (96.4 kg)    Physical Exam Vitals and nursing note reviewed.  Constitutional:      General: He is not in acute distress.    Appearance: He is well-developed. He is not diaphoretic.     Comments: Well-appearing, comfortable, cooperative  HENT:     Head: Normocephalic and atraumatic.  Eyes:     General:        Right eye: No discharge.        Left eye: No discharge.  Conjunctiva/sclera: Conjunctivae normal.  Neck:     Thyroid: No thyromegaly.  Cardiovascular:     Rate and Rhythm: Normal rate and regular rhythm.     Pulses: Normal pulses.     Heart sounds: Normal heart sounds. No murmur heard. Pulmonary:     Effort: Pulmonary effort is normal. No respiratory distress.     Breath sounds: Normal breath sounds. No wheezing or rales.  Musculoskeletal:        General: Normal range of motion.     Cervical back: Normal range of motion and neck supple.  Lymphadenopathy:     Cervical: No cervical adenopathy.  Skin:    General: Skin is warm and dry.     Findings: No erythema or rash.  Neurological:     Mental Status: He is alert and oriented to person, place, and time. Mental status is at baseline.  Psychiatric:        Behavior: Behavior normal.     Comments: Well groomed, good eye  contact, normal speech and thoughts    Diabetic Foot Exam - Simple   Simple Foot Form Diabetic Foot exam was performed with the following findings: Yes 11/21/2020  9:48 AM  Visual Inspection No deformities, no ulcerations, no other skin breakdown bilaterally: Yes Sensation Testing Intact to touch and monofilament testing bilaterally: Yes Pulse Check Posterior Tibialis and Dorsalis pulse intact bilaterally: Yes Comments     Recent Labs    01/29/20 0000 10/29/20 1433 11/19/20 1016  HGBA1C 7.3 7.3* 7.3*    Results for orders placed or performed in visit on 66/44/03  Basic Metabolic Panel (BMET)  Result Value Ref Range   Glucose 114 (H) 65 - 99 mg/dL   BUN 18 8 - 27 mg/dL   Creatinine, Ser 1.24 0.76 - 1.27 mg/dL   eGFR 62 >59 mL/min/1.73   BUN/Creatinine Ratio 15 10 - 24   Sodium 136 134 - 144 mmol/L   Potassium 5.3 (H) 3.5 - 5.2 mmol/L   Chloride 100 96 - 106 mmol/L   CO2 21 20 - 29 mmol/L   Calcium 9.7 8.6 - 10.2 mg/dL  CBC  Result Value Ref Range   WBC 10.3 3.4 - 10.8 x10E3/uL   RBC 4.49 4.14 - 5.80 x10E6/uL   Hemoglobin 13.8 13.0 - 17.7 g/dL   Hematocrit 41.0 37.5 - 51.0 %   MCV 91 79 - 97 fL   MCH 30.7 26.6 - 33.0 pg   MCHC 33.7 31.5 - 35.7 g/dL   RDW 13.4 11.6 - 15.4 %   Platelets 571 (H) 150 - 450 x10E3/uL  TSH  Result Value Ref Range   TSH 1.410 0.450 - 4.500 uIU/mL      Assessment & Plan:   Problem List Items Addressed This Visit     Type 2 diabetes, controlled, with neuropathy ([place]) - Primary    Well-controlled DM with A1c stable 7.3, after med change Complications - hyperlipidemia, vascular disease PAD   Plan:  1. Continue current therapy - Ozempic 0.84m weekly, Jardiance 232mdaily - no change today, consider future dose inc Ozempic to 55m3mf indicated. 2. Encourage improved lifestyle - low carb, low sugar diet, reduce portion size, continue improving regular exercise 3. Check CBG , bring log to next visit for review 4. Continue ASA, ARB,  Statin 5. DM Foot exam done today / Advised to schedule DM ophtho exam, send record       Relevant Orders   Hemoglobin A1c   S/P CABG (coronary artery  bypass graft)   Hyperlipidemia associated with type 2 diabetes mellitus ([place])   Coronary artery disease involving native coronary artery of native heart without angina pectoris    CAD, S/p CABG HFrEF Followed by [facility]iology, CVTS Continue medication management per cardiology, reviewed recent changes Pursue Cardiac Rehab.    No orders of the defined types were placed in this encounter.     Follow up plan: Return in about 4 months (around 03/09/2021) for follow-up 3-4 months mid or later October DM A1c.  Future labs ordered for  A1c [facility]  Nobie Putnam, DO [facility] 11/21/2020, 9:29 AM

## 2020-11-21 NOTE — Assessment & Plan Note (Addendum)
Managed on Statin + Zetia therapy

## 2020-11-21 NOTE — Assessment & Plan Note (Addendum)
Well-controlled DM with A1c stable 7.3, after med change Complications - hyperlipidemia, vascular disease PAD   Plan:  1. Continue current therapy - Ozempic 0.5mg  weekly, Jardiance 25mg  daily - no change today, consider future dose inc Ozempic to 1mg  if indicated. 2. Encourage improved lifestyle - low carb, low sugar diet, reduce portion size, continue improving regular exercise 3. Check CBG , bring log to next visit for review 4. Continue ASA, ARB, Statin 5. DM Foot exam done today / Advised to schedule DM ophtho exam, send record

## 2020-11-21 NOTE — Patient Instructions (Addendum)
Thank you for coming to the office today.  Recent Labs    01/29/20 0000 10/29/20 1433 11/19/20 1016  HGBA1C 7.3 7.3* 7.3*   [facility] A1c 1 week before you come in  Keep on current dosages of Ozempic, Jardiance  Please schedule a Follow-up Appointment to: Return in about 4 months (around 03/09/2021) for follow-up 3-4 months mid or later October DM A1c.  If you have any other questions or concerns, please feel free to call the office or send a message through MyChart. You may also schedule an earlier appointment if necessary.  Additionally, you may be receiving a survey about your experience at our office within a few days to 1 week by e-mail or mail. We value your feedback.  Saralyn Pilar, DO [facility], [place]

## 2020-11-24 ENCOUNTER — Other Ambulatory Visit: Payer: Self-pay | Admitting: *Deleted

## 2020-11-24 DIAGNOSIS — Z951 Presence of aortocoronary bypass graft: Secondary | ICD-10-CM

## 2020-11-25 ENCOUNTER — Encounter: Payer: Self-pay | Admitting: Cardiothoracic Surgery

## 2020-11-25 ENCOUNTER — Ambulatory Visit (INDEPENDENT_AMBULATORY_CARE_PROVIDER_SITE_OTHER): Payer: Medicare Other | Admitting: Cardiothoracic Surgery

## 2020-11-25 ENCOUNTER — Ambulatory Visit
Admission: RE | Admit: 2020-11-25 | Discharge: 2020-11-25 | Disposition: A | Discharge status: Home / Self Care | Payer: Managed Care, Other (non HMO) | Source: Ambulatory Visit | Attending: Cardiothoracic Surgery | Admitting: Cardiothoracic Surgery

## 2020-11-25 ENCOUNTER — Other Ambulatory Visit: Payer: Self-pay

## 2020-11-25 VITALS — BP 116/72 | HR 99 | Resp 20 | Ht 70.5 in | Wt 198.0 lb

## 2020-11-25 DIAGNOSIS — I2510 Atherosclerotic heart disease of native coronary artery without angina pectoris: Secondary | ICD-10-CM

## 2020-11-25 DIAGNOSIS — Z951 Presence of aortocoronary bypass graft: Secondary | ICD-10-CM

## 2020-11-25 DIAGNOSIS — I509 Heart failure, unspecified: Secondary | ICD-10-CM

## 2020-11-25 NOTE — Progress Notes (Signed)
[address]       [name] [address]             [phone]     CARDIOTHORACIC SURGERY OFFICE NOTE  Referring Provider is [name], Tollie Pizza, MD Primary Cardiologist is Julien Nordmann, MD PCP is Smitty Cords, DO   HPI:  70 year old man presents for postoperative visit status post CABG in late June.  He has done well after surgery.  He initially presented with a depressed ejection fraction and he is continued with Entresto medication.  He denies chest pain or shortness of breath and is feeling well   Current Outpatient Medications  Medication Sig Dispense Refill   acetaminophen (TYLENOL) 500 MG tablet Take 2 tablets (1,000 mg total) by mouth every 6 (six) hours as needed. 30 tablet 0   aspirin EC 81 MG tablet Take 81 mg by mouth in the morning. Swallow whole.     atorvastatin (LIPITOR) 80 MG tablet Take 80 mg by mouth in the morning.     diphenhydrAMINE (BENADRYL) 25 MG tablet Take 75 mg by mouth at bedtime.     ezetimibe (ZETIA) 10 MG tablet Take 1 tablet (10 mg total) by mouth in the morning.     fenofibrate (TRICOR) 145 MG tablet Take 145 mg by mouth in the morning.     JARDIANCE 25 MG TABS tablet Take 1 tablet (25 mg total) by mouth daily before breakfast. 90 tablet 1   metoprolol succinate (TOPROL-XL) 50 MG 24 hr tablet Take 1 tablet (50 mg total) by mouth daily. Take with or immediately following a meal. 90 tablet 3   nitroGLYCERIN (NITROSTAT) 0.4 MG SL tablet Place 1 tablet (0.4 mg total) under the tongue every 5 (five) minutes x 3 doses as needed for chest pain.     omeprazole (PRILOSEC) 20 MG capsule Take 20 mg by mouth in the morning.     OZEMPIC, 0.25 OR 0.5 MG/DOSE, 2 MG/1.5ML SOPN Inject 0.5 mg into the skin every Monday.     sacubitril-valsartan (ENTRESTO) 24-26 MG Take 1 tablet by mouth 2 (two) times daily. 60 tablet 0   Vitamin D, Ergocalciferol, (DRISDOL) 1.25 MG (50000 UNIT) CAPS capsule Take 50,000 Units by mouth 2 (two) times a  week. Sundays & Thursdays     No current facility-administered medications for this visit.      Physical Exam:   BP 116/72 (BP Location: Right Arm, Patient Position: Sitting, Cuff Size: Normal)   Pulse 99   Resp 20   Ht 5\' 10.5" (1.791 m)   Wt 89.8 kg   SpO2 97% Comment: RA  BMI 28.01 kg/m   General:  Well-appearing  Chest:   Clear to auscultation  CV:   Regular rhythm  Incisions:  Healing well  Abdomen:  Soft nontender  Extremities:  No edema  Diagnostic Tests:  I have personally reviewed his chest x-ray from today which shows clear lung fields and stable mediastinal   Impression:  Doing well after CABG  Plan:  Follow-up as needed Okay to drive now Okay to start cardiac rehab 12/15/2020  I spent in excess of 10 minutes during the conduct of this office consultation and >50% of this time involved direct face-to-face encounter with the patient for counseling and/or coordination of their care.  Level 2                 10  minutes Level 3  15 minutes Level 4                 25 minutes Level 5                 40 minutes  B.  Lorayne Marek, MD 11/25/2020 2:15 PM

## 2020-11-27 ENCOUNTER — Ambulatory Visit: Payer: Medicare Other | Admitting: Cardiothoracic Surgery

## 2020-11-28 ENCOUNTER — Telehealth: Payer: Self-pay | Admitting: Physician Assistant

## 2020-11-28 NOTE — Telephone Encounter (Signed)
Patient forms received in red folder and placed on Eula Listen PA-C desk for his review.

## 2020-11-28 NOTE — Telephone Encounter (Signed)
Patient forms received via fax.     Called patient and notified of ROI and $29 fee.  He agrees to come by the office to start processing .     Patient forms not legible he will bring the original .

## 2020-11-28 NOTE — Telephone Encounter (Signed)
Patient signed forms and paid Check $29 fee.   Placed forms in nurse box for Processing .

## 2020-12-01 NOTE — Telephone Encounter (Signed)
Please inform patient I am out of the office this week and will complete once back.

## 2020-12-01 NOTE — Telephone Encounter (Signed)
Left voicemail message for patient to call back for review of forms and to discuss who should complete them.

## 2020-12-01 NOTE — Telephone Encounter (Signed)
Based on review of forms these look to be due to his CABG that he had done. These would be more appropriate to be completed by Surgeons office and will review this with him with updates.

## 2020-12-01 NOTE — Telephone Encounter (Signed)
Spoke with patient who then requested I speak with his wife. Reviewed that we received forms for completion but that provider is out of the office and that these forms are for his CABG. We discussed that these would need to go over to the surgeon's office for them to complete. She verbalized understanding of this information and requested that we fax or send these over to them.   Gave forms to Cowen in front office to be sent over to [facility] office. She is going to send them interoffice over to their office for completion.

## 2020-12-01 NOTE — Telephone Encounter (Signed)
Attempted to discuss with [facility].  LMOV for [facility] lpn .

## 2020-12-02 NOTE — Telephone Encounter (Signed)
Spoke with Darl Pikes at [employer].  Ok to [employer] forms and check payment.   Sent via interoffice mail to [employer]   Check enclosed with packet copy made and placed in forms pending folder .

## 2020-12-05 DIAGNOSIS — Z736 Limitation of activities due to disability: Secondary | ICD-10-CM

## 2020-12-08 ENCOUNTER — Encounter: Payer: Managed Care, Other (non HMO) | Attending: Cardiovascular Disease | Admitting: *Deleted

## 2020-12-08 ENCOUNTER — Other Ambulatory Visit: Payer: Self-pay

## 2020-12-08 DIAGNOSIS — Z951 Presence of aortocoronary bypass graft: Secondary | ICD-10-CM

## 2020-12-08 NOTE — Progress Notes (Signed)
Initial telephone orientation completed. Diagnosis can be found in [facility] 6/17. EP orientation scheduled for Tuesday 8/2 at 10am.

## 2020-12-15 ENCOUNTER — Other Ambulatory Visit: Payer: Self-pay | Admitting: Physician Assistant

## 2020-12-15 NOTE — Progress Notes (Signed)
Cardiology Office Note  Date:  12/16/2020   ID:  Ronald Roth, DOB 04-Oct-1950, MRN [id number]  PCP:  Ronald Cords, DO   Chief Complaint  Patient presents with   1 month follow up     Discuss Zio results. "Doing well." Medications reviewed by the patient verbally.     HPI:  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  Who presents for follow-up of his coronary artery disease coronary disease  Angina leading to stress test showing large region of ischemia, Followed by cardiac catheterization October 20, 2020 results as below Severe three-vessel coronary artery disease, including long segment of diffuse mid LAD disease of 50-90% stenosis with moderate to severe associated calcification and occlusion of branch of D1, small LCx with chronic total occlusion of OM1, and large RCA with 70-90% tandem lesions involving rPDA and 60% rPL stenosis. Patient rPL stent with mild in-stent restenosis (~20%). Upper normal left ventricular filling pressure (LVEDP ~15 mmHg).   Referred for CABG Seen in [place], Underwent CABG October 31, 2020  (LIMA to LAD, SVG to DIAGONAL, SVG to PDA and PLB),  Currently feels well, no complaints, doing cardiac rehab Denies any angina with exertion Heart rate running high especially with exertion  Zio monitor reviewed in detail with him today Patient had a min HR of 75 bpm, max HR of 193 bpm, and avg HR of 96 bpm. Predominant underlying rhythm was Sinus Rhythm.  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 3 Supraventricular Tachycardia runs occurred, the run with the fastest interval lasting 13 beats with a max rate of 193 bpm, the longest lasting 19.3 secs with an avg rate of 117 bpm.   Isolated SVEs were rare (<1.0%), SVE  Couplets were rare (<1.0%), and SVE Triplets were rare (<1.0%). Isolated VEs were occasional  (1.6%, 30479), VE Couplets were rare (<1.0%, 3115), and VE Triplets were rare (<1.0%, 1).  EKG personally reviewed by myself on todays visit Normal sinus rhythm rate 85 bpm left bundle branch block,   Lab work reviewed from September 2021 Creatinine 1.23 Total cholesterol 164 LDL 102 A1c 7.3    PMH:   has a past medical history of Anxiety, Coronary artery disease, Diabetes mellitus without complication (HCC), GERD (gastroesophageal reflux disease), Heart attack (HCC) (2012), Hypertension, and Sciatica.  PSH:    Past Surgical History:  Procedure Laterality Date   CARDIAC CATHETERIZATION     CORONARY ANGIOPLASTY WITH STENT PLACEMENT  2012   x 1   CORONARY ARTERY BYPASS GRAFT N/A 10/31/2020   Procedure: CORONARY ARTERY BYPASS GRAFTING (CABG)x 4 ON CARDIOPULMONARY BYPASS. LEFT INTERNAL MAMMARY ARTERY AND RIGHT GREATER SAPHENOUS VEIN. LIMA TO LAD, SVG TO DIAG., SVG TO PDA  AND PLB;  Surgeon: Linden Dolin, MD;  Location: [facility] OR;  Service: Open Heart Surgery;  Laterality: N/A;   ENDOVEIN HARVEST OF GREATER SAPHENOUS VEIN Right 10/31/2020   Procedure: ENDOVEIN HARVEST OF GREATER SAPHENOUS VEIN;  Surgeon: Linden Dolin, MD;  Location: [facility] OR;  Service: Open Heart Surgery;  Laterality: Right;   LEFT HEART CATH AND CORONARY ANGIOGRAPHY N/A 10/20/2020   Procedure: LEFT HEART CATH AND CORONARY ANGIOGRAPHY;  Surgeon: Yvonne Kendall, MD;  Location: [facility] INVASIVE CV LAB;  Service: Cardiovascular;  Laterality: N/A;   Ruptured disc  Sciatica    STENT PLACEMENT VASCULAR ([facility] HX)  2012   TEE WITHOUT CARDIOVERSION N/A 10/31/2020   Procedure: TRANSESOPHAGEAL ECHOCARDIOGRAM (TEE);  Surgeon: Linden Dolin, MD;  Location: [facility] OR;  Service: Open Heart Surgery;  Laterality: N/A;    Current Outpatient Medications on File Prior to Visit  Medication Sig Dispense Refill   acetaminophen (TYLENOL) 500 MG tablet  Take 2 tablets (1,000 mg total) by mouth every 6 (six) hours as needed. 30 tablet 0   aspirin EC 81 MG tablet Take 81 mg by mouth in the morning. Swallow whole.     atorvastatin (LIPITOR) 80 MG tablet Take 80 mg by mouth in the morning.     diphenhydrAMINE (BENADRYL) 25 MG tablet Take 75 mg by mouth at bedtime.     ezetimibe (ZETIA) 10 MG tablet Take 1 tablet (10 mg total) by mouth in the morning.     fenofibrate (TRICOR) 145 MG tablet Take 145 mg by mouth in the morning.     JARDIANCE 25 MG TABS tablet Take 1 tablet (25 mg total) by mouth daily before breakfast. 90 tablet 1   metoprolol succinate (TOPROL-XL) 50 MG 24 hr tablet Take 1 tablet (50 mg total) by mouth daily. Take with or immediately following a meal. 90 tablet 3   nitroGLYCERIN (NITROSTAT) 0.4 MG SL tablet Place 1 tablet (0.4 mg total) under the tongue every 5 (five) minutes x 3 doses as needed for chest pain.     omeprazole (PRILOSEC) 20 MG capsule Take 20 mg by mouth in the morning.     OZEMPIC, 0.25 OR 0.5 MG/DOSE, 2 MG/1.5ML SOPN Inject 0.5 mg into the skin every Monday.     sacubitril-valsartan (ENTRESTO) 24-26 MG Take 1 tablet by mouth 2 (two) times daily. 60 tablet 0   Vitamin D, Ergocalciferol, (DRISDOL) 1.25 MG (50000 UNIT) CAPS capsule Take 50,000 Units by mouth 2 (two) times a week. Sundays & Thursdays     No current facility-administered medications on file prior to visit.    Allergies:   Patient has no known allergies.   Social History:  The patient  reports that he quit smoking about 29 years ago. His smoking use included cigarettes. He has a 30.00 pack-year smoking history. He has never used smokeless tobacco. He reports current alcohol use. He reports that he does not use drugs.   Family History:   family history includes Aortic aneurysm in his father; Arrhythmia in his mother; Hyperlipidemia in his sister; Hypertension in his sister; Pneumonia in his mother.    Review of Systems: Review of Systems   Constitutional: Negative.   HENT: Negative.    Respiratory: Negative.    Cardiovascular: Negative.   Gastrointestinal: Negative.   Musculoskeletal: Negative.   Neurological: Negative.   Psychiatric/Behavioral: Negative.    All other systems reviewed and are negative.   PHYSICAL EXAM: VS:  BP 140/80 (BP Location: Left Arm, Patient Position: Sitting, Cuff Size: Normal)   Pulse 85   Ht 5' 10.5" (1.791 m)   Wt 196 lb 8 oz (89.1 kg)   SpO2 98%   BMI 27.80 kg/m  , BMI Body mass index is 27.8 kg/m. Constitutional:  oriented to person, place, and time. No distress.  HENT:  Head: Grossly normal Eyes:  no discharge. No scleral icterus.  Neck: No JVD, no carotid bruits  Cardiovascular: Regular rate and rhythm, no murmurs appreciated Pulmonary/Chest: Clear to auscultation bilaterally, no wheezes or rails Abdominal: Soft.  no distension.  no tenderness.  Musculoskeletal: Normal range of motion Neurological:  normal muscle tone. Coordination normal. No atrophy Skin: Skin warm and dry Psychiatric: normal affect, pleasant   Recent Labs: 11/01/2020: Magnesium 2.1 11/19/2020: ALT 9; BUN 19; Creatinine, Ser 1.31; Hemoglobin 14.7; Platelets 575; Potassium 4.7; Sodium 140; TSH 1.110    Lipid Panel Lab Results  Component Value Date   CHOL 135 11/19/2020   HDL 30 (L) 11/19/2020   LDLCALC 79 11/19/2020   TRIG 149 11/19/2020      Wt Readings from Last 3 Encounters:  12/16/20 196 lb 8 oz (89.1 kg)  11/25/20 198 lb (89.8 kg)  11/21/20 195 lb 9.6 oz (88.7 kg)       ASSESSMENT AND PLAN:  Problem List Items Addressed This Visit       Cardiology Problems   Cardiomyopathy [facility])   Coronary artery disease involving native coronary artery of native heart without angina pectoris - Primary     Other   Type 2 diabetes, controlled, with neuropathy (HCC)   Other Visit Diagnoses     HFrEF (heart failure with reduced ejection fraction) (HCC)       S/P CABG x 4       AV heart block        Palpitations       Essential hypertension         Cad: Currently with no symptoms of angina. No further workup at this time. Continue current medication regimen. Recovererd from CABG  Essential hypertension Blood pressure is well controlled on today's visit.  Add extra metoprolol 25 in the PM, May need 50 BID  Shortness of breath SOB improved  Hyperlipidemia Cholesterol is at goal on the current lipid regimen. No changes to the medications were made.  Coronary disease with stable angina Currently with no symptoms of angina. No further workup at this time. Continue current medication regimen.    Total encounter time more than 25 minutes  Greater than 50% was spent in counseling and coordination of care with the patient    Signed, Dossie Arbour, M.D., Ph.D. [facility] [place], [place] [phone]

## 2020-12-16 ENCOUNTER — Other Ambulatory Visit: Payer: Self-pay

## 2020-12-16 ENCOUNTER — Encounter: Payer: Self-pay | Admitting: Cardiovascular Disease

## 2020-12-16 ENCOUNTER — Encounter: Payer: Managed Care, Other (non HMO) | Attending: Cardiovascular Disease

## 2020-12-16 ENCOUNTER — Other Ambulatory Visit: Payer: Self-pay | Admitting: Cardiovascular Disease

## 2020-12-16 ENCOUNTER — Ambulatory Visit (INDEPENDENT_AMBULATORY_CARE_PROVIDER_SITE_OTHER): Payer: Managed Care, Other (non HMO) | Admitting: Cardiovascular Disease

## 2020-12-16 VITALS — BP 140/80 | HR 85 | Ht 70.5 in | Wt 196.5 lb

## 2020-12-16 VITALS — Ht 70.75 in | Wt 196.2 lb

## 2020-12-16 DIAGNOSIS — E1140 Type 2 diabetes mellitus with diabetic neuropathy, unspecified: Secondary | ICD-10-CM

## 2020-12-16 DIAGNOSIS — I2510 Atherosclerotic heart disease of native coronary artery without angina pectoris: Secondary | ICD-10-CM

## 2020-12-16 DIAGNOSIS — Z951 Presence of aortocoronary bypass graft: Secondary | ICD-10-CM | POA: Diagnosis present

## 2020-12-16 DIAGNOSIS — I10 Essential (primary) hypertension: Secondary | ICD-10-CM

## 2020-12-16 DIAGNOSIS — I255 Ischemic cardiomyopathy: Secondary | ICD-10-CM

## 2020-12-16 DIAGNOSIS — I5020 Unspecified systolic (congestive) heart failure: Secondary | ICD-10-CM

## 2020-12-16 DIAGNOSIS — I4430 Unspecified atrioventricular block: Secondary | ICD-10-CM

## 2020-12-16 DIAGNOSIS — R079 Chest pain, unspecified: Secondary | ICD-10-CM

## 2020-12-16 DIAGNOSIS — I200 Unstable angina: Secondary | ICD-10-CM

## 2020-12-16 DIAGNOSIS — R002 Palpitations: Secondary | ICD-10-CM

## 2020-12-16 MED ORDER — ENTRESTO 24-26 MG PO TABS: 1.0000 | ORAL_TABLET | Freq: Two times a day (BID) | ORAL | 11 refills | Status: DC

## 2020-12-16 MED ORDER — METOPROLOL SUCCINATE ER 50 MG PO TB24: 50.0000 mg | ORAL_TABLET | Freq: Every day | ORAL | 3 refills | Status: DC

## 2020-12-16 MED ORDER — EZETIMIBE 10 MG PO TABS
10.0000 mg | ORAL_TABLET | Freq: Every morning | ORAL | 3 refills | Status: DC
Start: 2020-12-16 — End: 2020-12-16

## 2020-12-16 MED ORDER — EZETIMIBE 10 MG PO TABS: 10.0000 mg | ORAL_TABLET | Freq: Every morning | ORAL | 3 refills | Status: DC

## 2020-12-16 MED ORDER — ATORVASTATIN CALCIUM 80 MG PO TABS
80.0000 mg | ORAL_TABLET | Freq: Every morning | ORAL | 3 refills | Status: DC
Start: 2020-12-16 — End: 2021-06-29

## 2020-12-16 MED ORDER — NITROGLYCERIN 0.4 MG SL SUBL: 0.4000 mg | SUBLINGUAL_TABLET | SUBLINGUAL | 3 refills | Status: DC | PRN

## 2020-12-16 MED ORDER — JARDIANCE 25 MG PO TABS: 25.0000 mg | ORAL_TABLET | Freq: Every day | ORAL | 3 refills | Status: DC

## 2020-12-16 NOTE — Patient Instructions (Signed)
Patient Instructions  Patient Details  Name: Ronald Roth MRN: [id number] Date of Birth: 04-01-1951 Referring Provider:  Antonieta Iba, MD  Below are your personal goals for exercise, nutrition, and risk factors. Our goal is to help you stay on track towards obtaining and maintaining these goals. We will be discussing your progress on these goals with you throughout the program.  Initial Exercise Prescription:  Initial Exercise Prescription - 12/16/20 1500       Date of Initial Exercise RX and Referring Provider   Date 12/16/20    Referring Provider [name], TImothy MD      Treadmill   MPH 2.3    Grade 0.5    Minutes 15    METs 2.92      Recumbant Bike   Level 2    RPM 60    Watts 25    Minutes 15    METs 2.9      NuStep   Level 2    SPM 80    Minutes 15    METs 2.9      REL-XR   Level 1    Speed 50    Minutes 15    METs 2.9      T5 Nustep   Level 1    SPM 80    Minutes 15    METs 2.9      Prescription Details   Frequency (times per week) 3    Duration Progress to 30 minutes of continuous aerobic without signs/symptoms of physical distress      Intensity   THRR 40-80% of Max Heartrate 117-138    Ratings of Perceived Exertion 11-13    Perceived Dyspnea 0-4      Progression   Progression Continue to progress workloads to maintain intensity without signs/symptoms of physical distress.      Resistance Training   Training Prescription Yes    Weight 4 lb    Reps 10-15             Exercise Goals: Frequency: Be able to perform aerobic exercise two to three times per week in program working toward 2-5 days per week of home exercise.  Intensity: Work with a perceived exertion of 11 (fairly light) - 15 (hard) while following your exercise prescription.  We will make changes to your prescription with you as you progress through the program.   Duration: Be able to do 30 to 45 minutes of continuous aerobic exercise in addition to a 5 minute warm-up and  a 5 minute cool-down routine.   Nutrition Goals: Your personal nutrition goals will be established when you do your nutrition analysis with the dietician.  The following are general nutrition guidelines to follow: Cholesterol < 200mg /day Sodium < 1500mg /day Fiber: Men over 50 yrs - 30 grams per day  Personal Goals:  Personal Goals and Risk Factors at Admission - 12/16/20 1546       Core Components/Risk Factors/Patient Goals on Admission    Weight Management Yes;Weight Loss    Intervention Weight Management: Develop a combined nutrition and exercise program designed to reach desired caloric intake, while maintaining appropriate intake of nutrient and fiber, sodium and fats, and appropriate energy expenditure required for the weight goal.;Weight Management: Provide education and appropriate resources to help participant work on and attain dietary goals.;Weight Management/Obesity: Establish reasonable short term and long term weight goals.    Admit Weight 196 lb (88.9 kg)    Goal Weight: Short Term 191 lb (86.6 kg)  Goal Weight: Long Term 186 lb (84.4 kg)    Expected Outcomes Short Term: Continue to assess and modify interventions until short term weight is achieved;Long Term: Adherence to nutrition and physical activity/exercise program aimed toward attainment of established weight goal;Weight Loss: Understanding of general recommendations for a balanced deficit meal plan, which promotes 1-2 lb weight loss per week and includes a negative energy balance of [phone] kcal/d;Understanding recommendations for meals to include 15-35% energy as protein, 25-35% energy from fat, 35-60% energy from carbohydrates, less than 200mg  of dietary cholesterol, 20-35 gm of total fiber daily;Understanding of distribution of calorie intake throughout the day with the consumption of 4-5 meals/snacks    Diabetes Yes    Intervention Provide education about signs/symptoms and action to take for  hypo/hyperglycemia.;Provide education about proper nutrition, including hydration, and aerobic/resistive exercise prescription along with prescribed medications to achieve blood glucose in normal ranges: Fasting glucose 65-99 mg/dL    Hypertension Yes    Intervention Provide education on lifestyle modifcations including regular physical activity/exercise, weight management, moderate sodium restriction and increased consumption of fresh fruit, vegetables, and low fat dairy, alcohol moderation, and smoking cessation.;Monitor prescription use compliance.    Expected Outcomes Short Term: Continued assessment and intervention until BP is < 140/74mm HG in hypertensive participants. < 130/61mm HG in hypertensive participants with diabetes, heart failure or chronic kidney disease.;Long Term: Maintenance of blood pressure at goal levels.    Lipids Yes    Intervention Provide education and support for participant on nutrition & aerobic/resistive exercise along with prescribed medications to achieve LDL 70mg , HDL >40mg .             Tobacco Use Initial Evaluation: Social History   Tobacco Use  Smoking Status Former   Packs/day: 1.00   Years: 30.00   Pack years: 30.00   Types: Cigarettes   Quit date: 04/18/1991   Years since quitting: 29.6  Smokeless Tobacco Never    Exercise Goals and Review:  Exercise Goals     Row Name 12/16/20 1545             Exercise Goals   Increase Physical Activity Yes       Intervention Provide advice, education, support and counseling about physical activity/exercise needs.;Develop an individualized exercise prescription for aerobic and resistive training based on initial evaluation findings, risk stratification, comorbidities and participant's personal goals.       Expected Outcomes Short Term: Attend rehab on a regular basis to increase amount of physical activity.;Long Term: Add in home exercise to make exercise part of routine and to increase amount of  physical activity.;Long Term: Exercising regularly at least 3-5 days a week.       Increase Strength and Stamina Yes       Intervention Provide advice, education, support and counseling about physical activity/exercise needs.;Develop an individualized exercise prescription for aerobic and resistive training based on initial evaluation findings, risk stratification, comorbidities and participant's personal goals.       Expected Outcomes Short Term: Increase workloads from initial exercise prescription for resistance, speed, and METs.;Short Term: Perform resistance training exercises routinely during rehab and add in resistance training at home;Long Term: Improve cardiorespiratory fitness, muscular endurance and strength as measured by increased METs and functional capacity (02/15/21)       Able to understand and use rate of perceived exertion (RPE) scale Yes       Intervention Provide education and explanation on how to use RPE scale       Expected  Outcomes Short Term: Able to use RPE daily in rehab to express subjective intensity level;Long Term:  Able to use RPE to guide intensity level when exercising independently       Able to understand and use Dyspnea scale Yes       Intervention Provide education and explanation on how to use Dyspnea scale       Expected Outcomes Short Term: Able to use Dyspnea scale daily in rehab to express subjective sense of shortness of breath during exertion;Long Term: Able to use Dyspnea scale to guide intensity level when exercising independently       Knowledge and understanding of Target Heart Rate Range (THRR) Yes       Intervention Provide education and explanation of THRR including how the numbers were predicted and where they are located for reference       Expected Outcomes Short Term: Able to state/look up THRR;Long Term: Able to use THRR to govern intensity when exercising independently;Short Term: Able to use daily as guideline for intensity in rehab       Able to  check pulse independently Yes       Intervention Provide education and demonstration on how to check pulse in carotid and radial arteries.;Review the importance of being able to check your own pulse for safety during independent exercise       Expected Outcomes Short Term: Able to explain why pulse checking is important during independent exercise;Long Term: Able to check pulse independently and accurately       Understanding of Exercise Prescription Yes       Intervention Provide education, explanation, and written materials on patient's individual exercise prescription       Expected Outcomes Short Term: Able to explain program exercise prescription;Long Term: Able to explain home exercise prescription to exercise independently                Copy of goals given to participant.

## 2020-12-16 NOTE — Addendum Note (Signed)
Addended by: Maple Hudson on: 12/16/2020 01:53 PM   Modules accepted: Orders

## 2020-12-16 NOTE — Progress Notes (Signed)
Cardiac Individual Treatment Plan  Patient Details  Name: Ronald Roth MRN: [id number] Date of Birth: 1950/05/19 Referring Provider:   Flowsheet Row Cardiac Rehab from 12/16/2020 in [facility] Cardiac and Pulmonary Rehab  Referring Provider Julien Nordmann MD       Initial Encounter Date:  Flowsheet Row Cardiac Rehab from 12/16/2020 in [facility] Cardiac and Pulmonary Rehab  Date 12/16/20       Visit Diagnosis: S/P CABG x 4  Patient's Home Medications on Admission:  Current Outpatient Medications:    acetaminophen (TYLENOL) 500 MG tablet, Take 2 tablets (1,000 mg total) by mouth every 6 (six) hours as needed., Disp: 30 tablet, Rfl: 0   aspirin EC 81 MG tablet, Take 81 mg by mouth in the morning. Swallow whole., Disp: , Rfl:    atorvastatin (LIPITOR) 80 MG tablet, Take 1 tablet (80 mg total) by mouth in the morning. Take 80 mg by mouth in the morning., Disp: 90 tablet, Rfl: 3   diphenhydrAMINE (BENADRYL) 25 MG tablet, Take 75 mg by mouth at bedtime., Disp: , Rfl:    ezetimibe (ZETIA) 10 MG tablet, Take 1 tablet (10 mg total) by mouth in the morning., Disp: 90 tablet, Rfl: 3   fenofibrate (TRICOR) 145 MG tablet, Take 145 mg by mouth in the morning., Disp: , Rfl:    JARDIANCE 25 MG TABS tablet, Take 1 tablet (25 mg total) by mouth daily before breakfast., Disp: 90 tablet, Rfl: 3   metoprolol succinate (TOPROL-XL) 50 MG 24 hr tablet, Take 1 tablet (50 mg total) by mouth daily. Take with or immediately following a meal., Disp: 90 tablet, Rfl: 3   nitroGLYCERIN (NITROSTAT) 0.4 MG SL tablet, Place 1 tablet (0.4 mg total) under the tongue every 5 (five) minutes x 3 doses as needed for chest pain., Disp: 25 tablet, Rfl: 3   omeprazole (PRILOSEC) 20 MG capsule, Take 20 mg by mouth in the morning., Disp: , Rfl:    OZEMPIC, 0.25 OR 0.5 MG/DOSE, 2 MG/1.5ML SOPN, Inject 0.5 mg into the skin every Monday., Disp: , Rfl:    sacubitril-valsartan (ENTRESTO) 24-26 MG, Take 1 tablet by mouth 2 (two) times daily.,  Disp: 60 tablet, Rfl: 11   Vitamin D, Ergocalciferol, (DRISDOL) 1.25 MG (50000 UNIT) CAPS capsule, Take 50,000 Units by mouth 2 (two) times a week. Sundays & Thursdays, Disp: , Rfl:   Past Medical History: Past Medical History:  Diagnosis Date   Anxiety    Coronary artery disease    Diabetes mellitus without complication (HCC)    GERD (gastroesophageal reflux disease)    Heart attack (HCC) 2012   Hypertension    Sciatica     Tobacco Use: Social History   Tobacco Use  Smoking Status Former   Packs/day: 1.00   Years: 30.00   Pack years: 30.00   Types: Cigarettes   Quit date: 04/18/1991   Years since quitting: 29.6  Smokeless Tobacco Never    Labs: Recent Review Flowsheet Data     Labs for ITP Cardiac and Pulmonary Rehab Latest Ref Rng & Units 10/31/2020 10/31/2020 10/31/2020 10/31/2020 11/19/2020   Cholestrol 100 - 199 mg/dL - - - - 409   LDLCALC 0 - 99 mg/dL - - - - 79   HDL >81 mg/dL - - - - 19(J)   Trlycerides 0 - 149 mg/dL - - - - 478   Hemoglobin A1c 4.8 - 5.6 % - - - - 7.3(H)   PHART 7.350 - 7.450 - 7.352 7.343(L) 7.303(L) -  PCO2ART 32.0 - 48.0 mmHg - 43.0 44.9 43.8 -   HCO3 20.0 - 28.0 mmol/L - 24.4 24.4 21.6 -   TCO2 22 - 32 mmol/L 23 26 26 23  -   ACIDBASEDEF 0.0 - 2.0 mmol/L - 2.0 2.0 5.0(H) -   O2SAT % - 95.0 98.0 97.0 -        Exercise Target Goals: Exercise Program Goal: Individual exercise prescription set using results from initial 6 min walk test and THRR while considering  patient's activity barriers and safety.   Exercise Prescription Goal: Initial exercise prescription builds to 30-45 minutes a day of aerobic activity, 2-3 days per week.  Home exercise guidelines will be given to patient during program as part of exercise prescription that the participant will acknowledge.   Education: Aerobic Exercise: - Group verbal and visual presentation on the components of exercise prescription. Introduces F.I.T.T principle from ACSM for exercise  prescriptions.  Reviews F.I.T.T. principles of aerobic exercise including progression. Written material given at graduation.   Education: Resistance Exercise: - Group verbal and visual presentation on the components of exercise prescription. Introduces F.I.T.T principle from ACSM for exercise prescriptions  Reviews F.I.T.T. principles of resistance exercise including progression. Written material given at graduation.    Education: Exercise & Equipment Safety: - Individual verbal instruction and demonstration of equipment use and safety with use of the equipment. Flowsheet Row Cardiac Rehab [facility] 12[facility]16/[facility] Cardiac [facility]  Education need identified 12/16/20  Date 12/16/20  Educator KL  Instruction Review Code 1- Verbalizes Understanding       Education: Exercise Physiology & General Exercise Guidelines: - Group verbal and written instruction with models to review the exercise physiology of the cardiovascular system and associated critical values. Provides general exercise guidelines with specific guidelines to those with heart or lung disease.    Education: Flexibility, Balance, Mind/Body Relaxation: - Group verbal and visual presentation with interactive activity on the components of exercise prescription. Introduces F.I.T.T principle from ACSM for exercise prescriptions. Reviews F.I.T.T. principles of flexibility and balance exercise training including progression. Also discusses the mind body connection.  Reviews various relaxation techniques to help reduce and manage stress (i.e. Deep breathing, progressive muscle relaxation, and visualization). Balance handout provided to take home. Written material given at graduation.   Activity Barriers & Risk Stratification:  Activity Barriers & Cardiac Risk Stratification - 12/16/20 1523       Activity Barriers & Cardiac Risk Stratification   Activity Barriers Joint Problems;Balance Concerns;Decreased Ventricular  Function;Other (comment);Deconditioning    Comments Limited ROM- right shoulder    Cardiac Risk Stratification High             6 Minute Walk:  6 Minute Walk     Row Name 12/16/20 1530         6 Minute Walk   Phase Initial     Distance 1225 feet     Walk Time 6 minutes     # of Rest Breaks 0     MPH 2.32     METS 2.9     RPE 12     Perceived Dyspnea  0     VO2 Peak 10.15     Symptoms No     Resting HR 96 bpm     Resting BP 134/68     Resting Oxygen Saturation  96 %     Exercise Oxygen Saturation  during 6 min walk 97 %     Max Ex. HR 106 bpm  Max Ex. BP 140/64     2 Minute Post BP 122/68              Oxygen Initial Assessment:   Oxygen Re-Evaluation:   Oxygen Discharge (Final Oxygen Re-Evaluation):   Initial Exercise Prescription:  Initial Exercise Prescription - 12/16/20 1500       Date of Initial Exercise RX and Referring Provider   Date 12/16/20    Referring Provider [name], TImothy MD      Treadmill   MPH 2.3    Grade 0.5    Minutes 15    METs 2.92      Recumbant Bike   Level 2    RPM 60    Watts 25    Minutes 15    METs 2.9      NuStep   Level 2    SPM 80    Minutes 15    METs 2.9      REL-XR   Level 1    Speed 50    Minutes 15    METs 2.9      T5 Nustep   Level 1    SPM 80    Minutes 15    METs 2.9      Prescription Details   Frequency (times per week) 3    Duration Progress to 30 minutes of continuous aerobic without signs/symptoms of physical distress      Intensity   THRR 40-80% of Max Heartrate 117-138    Ratings of Perceived Exertion 11-13    Perceived Dyspnea 0-4      Progression   Progression Continue to progress workloads to maintain intensity without signs/symptoms of physical distress.      Resistance Training   Training Prescription Yes    Weight 4 lb    Reps 10-15             Perform Capillary Blood Glucose checks as needed.  Exercise Prescription Changes:   Exercise  Comments:   Exercise Goals and Review:   Exercise Goals     Row Name 12/16/20 1545             Exercise Goals   Increase Physical Activity Yes       Intervention Provide advice, education, support and counseling about physical activity/exercise needs.;Develop an individualized exercise prescription for aerobic and resistive training based on initial evaluation findings, risk stratification, comorbidities and participant's personal goals.       Expected Outcomes Short Term: Attend rehab on a regular basis to increase amount of physical activity.;Long Term: Add in home exercise to make exercise part of routine and to increase amount of physical activity.;Long Term: Exercising regularly at least 3-5 days a week.       Increase Strength and Stamina Yes       Intervention Provide advice, education, support and counseling about physical activity/exercise needs.;Develop an individualized exercise prescription for aerobic and resistive training based on initial evaluation findings, risk stratification, comorbidities and participant's personal goals.       Expected Outcomes Short Term: Increase workloads from initial exercise prescription for resistance, speed, and METs.;Short Term: Perform resistance training exercises routinely during rehab and add in resistance training at home;Long Term: Improve cardiorespiratory fitness, muscular endurance and strength as measured by increased METs and functional capacity ( )       Able to understand and use rate of perceived exertion (RPE) scale Yes       Intervention Provide education and explanation on how to use  RPE scale       Expected Outcomes Short Term: Able to use RPE daily in rehab to express subjective intensity level;Long Term:  Able to use RPE to guide intensity level when exercising independently       Able to understand and use Dyspnea scale Yes       Intervention Provide education and explanation on how to use Dyspnea scale       Expected  Outcomes Short Term: Able to use Dyspnea scale daily in rehab to express subjective sense of shortness of breath during exertion;Long Term: Able to use Dyspnea scale to guide intensity level when exercising independently       Knowledge and understanding of Target Heart Rate Range (THRR) Yes       Intervention Provide education and explanation of THRR including how the numbers were predicted and where they are located for reference       Expected Outcomes Short Term: Able to state/look up THRR;Long Term: Able to use THRR to govern intensity when exercising independently;Short Term: Able to use daily as guideline for intensity in rehab       Able to check pulse independently Yes       Intervention Provide education and demonstration on how to check pulse in carotid and radial arteries.;Review the importance of being able to check your own pulse for safety during independent exercise       Expected Outcomes Short Term: Able to explain why pulse checking is important during independent exercise;Long Term: Able to check pulse independently and accurately       Understanding of Exercise Prescription Yes       Intervention Provide education, explanation, and written materials on patient's individual exercise prescription       Expected Outcomes Short Term: Able to explain program exercise prescription;Long Term: Able to explain home exercise prescription to exercise independently                Exercise Goals Re-Evaluation :   Discharge Exercise Prescription (Final Exercise Prescription Changes):   Nutrition:  Target Goals: Understanding of nutrition guidelines, daily intake of sodium 1500mg , cholesterol 200mg , calories 30% from fat and 7% or less from saturated fats, daily to have 5 or more servings of fruits and vegetables.  Education: All About Nutrition: -Group instruction provided by verbal, written material, interactive activities, discussions, models, and posters to present general  guidelines for heart healthy nutrition including fat, fiber, MyPlate, the role of sodium in heart healthy nutrition, utilization of the nutrition label, and utilization of this knowledge for meal planning. Follow up email sent as well. Written material given at graduation.   Biometrics:  Pre Biometrics - 12/16/20 1522       Pre Biometrics   Height 5' 10.75" (1.797 m)    Weight 196 lb 3.2 oz (89 kg)    BMI (Calculated) 27.56    Single Leg Stand 6.22 seconds              Nutrition Therapy Plan and Nutrition Goals:   Nutrition Assessments:  MEDIFICTS Score Key: ?70 Need to make dietary changes  40-70 Heart Healthy Diet ? 40 Therapeutic Level Cholesterol Diet  Flowsheet Row Cardiac Rehab from 12/16/2020 in [facility] Cardiac and Pulmonary Rehab  Picture Your Plate Total Score on Admission 54      Picture Your Plate Scores: <03 Unhealthy dietary pattern with much room for improvement. 41-50 Dietary pattern unlikely to meet recommendations for good health and room for improvement. 51-60 More healthful  dietary pattern, with some room for improvement.  >60 Healthy dietary pattern, although there may be some specific behaviors that could be improved.    Nutrition Goals Re-Evaluation:   Nutrition Goals Discharge (Final Nutrition Goals Re-Evaluation):   Psychosocial: Target Goals: Acknowledge presence or absence of significant depression and/or stress, maximize coping skills, provide positive support system. Participant is able to verbalize types and ability to use techniques and skills needed for reducing stress and depression.   Education: Stress, Anxiety, and Depression - Group verbal and visual presentation to define topics covered.  Reviews how body is impacted by stress, anxiety, and depression.  Also discusses healthy ways to reduce stress and to treat/manage anxiety and depression.  Written material given at graduation.   Education: Sleep Hygiene -Provides group verbal  and written instruction about how sleep can affect your health.  Define sleep hygiene, discuss sleep cycles and impact of sleep habits. Review good sleep hygiene tips.    Initial Review & Psychosocial Screening:  Initial Psych Review & Screening - 12/08/20 1542       Initial Review   Current issues with Current Stress Concerns      Family Dynamics   Good Support System? Yes   wife, children, grandchildren     Barriers   Psychosocial barriers to participate in program There are no identifiable barriers or psychosocial needs.;The patient should benefit from training in stress management and relaxation.      Screening Interventions   Interventions Encouraged to exercise;Provide feedback about the scores to participant;To provide support and resources with identified psychosocial needs    Expected Outcomes Short Term goal: Utilizing psychosocial counselor, staff and physician to assist with identification of specific Stressors or current issues interfering with healing process. Setting desired goal for each stressor or current issue identified.;Long Term Goal: Stressors or current issues are controlled or eliminated.;Short Term goal: Identification and review with participant of any Quality of Life or Depression concerns found by scoring the questionnaire.;Long Term goal: The participant improves quality of Life and PHQ9 Scores as seen by post scores and/or verbalization of changes             Quality of Life Scores:   Quality of Life - 12/16/20 1527       Quality of Life   Select Quality of Life      Quality of Life Scores   Health/Function Pre 23.2 %    Socioeconomic Pre 25.13 %    Psych/Spiritual Pre 23.14 %    Family Pre 27.6 %    GLOBAL Pre 24.26 %            Scores of 19 and below usually indicate a poorer quality of life in these areas.  A difference of  2-3 points is a clinically meaningful difference.  A difference of 2-3 points in the total score of the Quality of  Life Index has been associated with significant improvement in overall quality of life, self-image, physical symptoms, and general health in studies assessing change in quality of life.  PHQ-9: Recent Review Flowsheet Data     Depression screen [facility] 2/9 12/16/2020 11/21/2020 08/22/2020   Decreased Interest 0 0 0   Down, Depressed, Hopeless 0 0 0   PHQ - 2 Score 0 0 0   Altered sleeping 0 0 -   Tired, decreased energy 0 1 -   Change in appetite 0 0 -   Feeling bad or failure about yourself  0 0 -   Trouble  concentrating 0 0 -   Moving slowly or fidgety/restless 0 0 -   Suicidal thoughts 0 0 -   PHQ-9 Score 0 1 -   Difficult doing work/chores Not difficult at all Not difficult at all -      Interpretation of Total Score  Total Score Depression Severity:  1-4 = Minimal depression, 5-9 = Mild depression, 10-14 = Moderate depression, 15-19 = Moderately severe depression, 20-27 = Severe depression   Psychosocial Evaluation and Intervention:  Psychosocial Evaluation - 12/08/20 1554       Psychosocial Evaluation & Interventions   Interventions Encouraged to exercise with the program and follow exercise prescription    Comments Mr. Mochizuki is coming to cardiac rehab post CABG x 4 in mid June. He states he is doing well post op and starting to get back to his usual routine. He is retired from [employer] and likes to stay active.  He and his wife have 6 daughters and 2 sons that live out of town but are very supportive. He made some big lifestyle changes when he had his stents years ago. He and his wife like to kayak and be active so he is ready to get back to that in a safe way. He did report some anxiety/stress from the surgery where he felt the need to make videos for his children in case he didn't make it. He states that he didn't want them to know about it but he is starting to feel more confident now that its been weeks post op. His hope is this program will help build on his foundation of a  heart healthy lifestyle    Expected Outcomes Short: attend cardiac rehab for education and exericse. Long: develop and maintain positive self care habits.    Continue Psychosocial Services  Follow up required by staff             Psychosocial Re-Evaluation:   Psychosocial Discharge (Final Psychosocial Re-Evaluation):   Vocational Rehabilitation: Provide vocational rehab assistance to qualifying candidates.   Vocational Rehab Evaluation & Intervention:  Vocational Rehab - 12/08/20 1542       Initial Vocational Rehab Evaluation & Intervention   Assessment shows need for Vocational Rehabilitation No             Education: Education Goals: Education classes will be provided on a variety of topics geared toward better understanding of heart health and risk factor modification. Participant will state understanding/return demonstration of topics presented as noted by education test scores.  Learning Barriers/Preferences:  Learning Barriers/Preferences - 12/08/20 1541       Learning Barriers/Preferences   Learning Barriers None    Learning Preferences None             General Cardiac Education Topics:  AED/CPR: - Group verbal and written instruction with the use of models to demonstrate the basic use of the AED with the basic ABC's of resuscitation.   Anatomy and Cardiac Procedures: - Group verbal and visual presentation and models provide information about basic cardiac anatomy and function. Reviews the testing methods done to diagnose heart disease and the outcomes of the test results. Describes the treatment choices: Medical Management, Angioplasty, or Coronary Bypass Surgery for treating various heart conditions including Myocardial Infarction, Angina, Valve Disease, and Cardiac Arrhythmias.  Written material given at graduation.   Medication Safety: - Group verbal and visual instruction to review commonly prescribed medications for heart and lung disease.  Reviews the medication, class of the drug, and side  effects. Includes the steps to properly store meds and maintain the prescription regimen.  Written material given at graduation.   Intimacy: - Group verbal instruction through game format to discuss how heart and lung disease can affect sexual intimacy. Written material given at graduation..   Know Your Numbers and Heart Failure: - Group verbal and visual instruction to discuss disease risk factors for cardiac and pulmonary disease and treatment options.  Reviews associated critical values for Overweight/Obesity, Hypertension, Cholesterol, and Diabetes.  Discusses basics of heart failure: signs/symptoms and treatments.  Introduces Heart Failure Zone chart for action plan for heart failure.  Written material given at graduation.   Infection Prevention: - Provides verbal and written material to individual with discussion of infection control including proper hand washing and proper equipment cleaning during exercise session. Flowsheet Row Cardiac Rehab from 12/16/2020 in [facility] Cardiac and Pulmonary Rehab  Education need identified 12/16/20  Date 12/16/20  Educator KL  Instruction Review Code 1- Verbalizes Understanding       Falls Prevention: - Provides verbal and written material to individual with discussion of falls prevention and safety. Flowsheet Row Cardiac Rehab from 12/16/2020 in [facility] Cardiac and Pulmonary Rehab  Education need identified 12/16/20  Date 12/16/20  Educator KL  Instruction Review Code 1- Verbalizes Understanding       Other: -Provides group and verbal instruction on various topics (see comments)   Knowledge Questionnaire Score:  Knowledge Questionnaire Score - 12/16/20 1527       Knowledge Questionnaire Score   Pre Score 24/26: Angina, Exercise             Core Components/Risk Factors/Patient Goals at Admission:  Personal Goals and Risk Factors at Admission - 12/16/20 1546       Core  Components/Risk Factors/Patient Goals on Admission    Weight Management Yes;Weight Loss    Intervention Weight Management: Develop a combined nutrition and exercise program designed to reach desired caloric intake, while maintaining appropriate intake of nutrient and fiber, sodium and fats, and appropriate energy expenditure required for the weight goal.;Weight Management: Provide education and appropriate resources to help participant work on and attain dietary goals.;Weight Management/Obesity: Establish reasonable short term and long term weight goals.    Admit Weight 196 lb (88.9 kg)    Goal Weight: Short Term 191 lb (86.6 kg)    Goal Weight: Long Term 186 lb (84.4 kg)    Expected Outcomes Short Term: Continue to assess and modify interventions until short term weight is achieved;Long Term: Adherence to nutrition and physical activity/exercise program aimed toward attainment of established weight goal;Weight Loss: Understanding of general recommendations for a balanced deficit meal plan, which promotes 1-2 lb weight loss per week and includes a negative energy balance of [phone] kcal/d;Understanding recommendations for meals to include 15-35% energy as protein, 25-35% energy from fat, 35-60% energy from carbohydrates, less than 200mg  of dietary cholesterol, 20-35 gm of total fiber daily;Understanding of distribution of calorie intake throughout the day with the consumption of 4-5 meals/snacks    Diabetes Yes    Intervention Provide education about signs/symptoms and action to take for hypo/hyperglycemia.;Provide education about proper nutrition, including hydration, and aerobic/resistive exercise prescription along with prescribed medications to achieve blood glucose in normal ranges: Fasting glucose 65-99 mg/dL    Hypertension Yes    Intervention Provide education on lifestyle modifcations including regular physical activity/exercise, weight management, moderate sodium restriction and increased  consumption of fresh fruit, vegetables, and low fat dairy, alcohol moderation, and smoking cessation.;Monitor prescription  use compliance.    Expected Outcomes Short Term: Continued assessment and intervention until BP is < 140/67mm HG in hypertensive participants. < 130/88mm HG in hypertensive participants with diabetes, heart failure or chronic kidney disease.;Long Term: Maintenance of blood pressure at goal levels.    Lipids Yes    Intervention Provide education and support for participant on nutrition & aerobic/resistive exercise along with prescribed medications to achieve LDL 70mg , HDL >40mg .             Education:Diabetes - Individual verbal and written instruction to review signs/symptoms of diabetes, desired ranges of glucose level fasting, after meals and with exercise. Acknowledge that pre and post exercise glucose checks will be done for 3 sessions at entry of program. Flowsheet Row Cardiac Rehab from 12/16/2020 in [facility] Cardiac and Pulmonary Rehab  Education need identified 12/16/20  Date 12/16/20  Educator KL  Instruction Review Code 1- Verbalizes Understanding       Core Components/Risk Factors/Patient Goals Review:    Core Components/Risk Factors/Patient Goals at Discharge (Final Review):    ITP Comments:  ITP Comments     Row Name 12/08/20 1539 12/16/20 1513         ITP Comments Initial telephone orientation completed. Diagnosis can be found in [facility] 6/17. EP orientation scheduled for Tuesday 8/2 at 10am. Completed and gym orientation. Initial ITP created and sent for review to Dr. Bethann Punches, Medical Director.               Comments: Initial ITP

## 2020-12-16 NOTE — Patient Instructions (Addendum)
Medication Instructions:  Add additional metoprolol succinate 25 mg in the PM Monitor heart rate, We might need 50 mg twice a day  If you need a refill on your cardiac medications before your next appointment, please call your pharmacy.    Lab work: No new labs needed  Testing/Procedures: No new testing needed  Follow-Up: At [facility], you and your health needs are our priority.  As part of our continuing mission to provide you with exceptional heart care, we have created designated Provider Care Teams.  These Care Teams include your primary Cardiologist (physician) and Advanced Practice Providers (APPs -  Physician Assistants and Nurse Practitioners) who all work together to provide you with the care you need, when you need it.  You will need a follow up appointment in 6 months  Providers on your designated Care Team:   Nicolasa Ducking, NP Eula Listen, PA-C Marisue Ivan, PA-C Cadence Dalton, [place]  COVID-19 Vaccine Information can be found at: [email or web address] For questions related to vaccine distribution or appointments, please email [email or web address] or call [phone].

## 2020-12-17 DIAGNOSIS — Z951 Presence of aortocoronary bypass graft: Secondary | ICD-10-CM

## 2020-12-17 LAB — GLUCOSE, CAPILLARY
Glucose-Capillary: 153 mg/dL — ABNORMAL HIGH (ref 70–99)
Glucose-Capillary: 158 mg/dL — ABNORMAL HIGH (ref 70–99)

## 2020-12-17 NOTE — Progress Notes (Signed)
Daily Session Note  Patient Details  Name: Ronald Roth MRN: [id number] Date of Birth: [birth year] Referring Provider:   Flowsheet Row Cardiac Rehab from 12/16/2020 in [facility] Cardiac and Pulmonary Rehab  Referring Provider [name] MD       Encounter Date: 12/17/2020  Check In:  Session Check In - 12/17/20 1326       Check-In   Supervising physician immediately available to respond to emergencies See telemetry face sheet for immediately available ER MD    Location [facility]-Cardiac & Pulmonary Rehab    Staff Present Will Bonnet, RN,BC,MSN;Jessica Dorm[place], [place], RCEP, CCRP, Marylynn Pearson, MS, ASCM CEP, Exercise Physiologist    Virtual Visit No    Medication changes reported     No    Fall or balance concerns reported    No    Warm-up and Cool-down Performed on first and last piece of equipment    Resistance Training Performed Yes    VAD Patient? No    PAD/SET Patient? No      Pain Assessment   Currently in Pain? No/denies    Multiple Pain Sites No                Social History   Tobacco Use  Smoking Status Former   Packs/day: 1.00   Years: 30.00   Pack years: 30.00   Types: Cigarettes   Quit date: 04/18/1991   Years since quitting: 29.6  Smokeless Tobacco Never    Goals Met:  Independence with exercise equipment Exercise tolerated well No report of cardiac concerns or symptoms  Goals Unmet:  Not Applicable  Comments: First full day of exercise!  Patient was oriented to gym and equipment including functions, settings, policies, and procedures.  Patient's individual exercise prescription and treatment plan were reviewed.  All starting workloads were established based on the results of the 6 minute walk test done at initial orientation visit.  The plan for exercise progression was also introduced and progression will be customized based on patient's performance and goals.    Dr. Emily Filbert is Medical Director for [place].  Dr.  Ottie Glazier is Medical Director for [facility] Pulmonary Rehabilitation.

## 2020-12-18 ENCOUNTER — Encounter: Payer: Managed Care, Other (non HMO) | Admitting: *Deleted

## 2020-12-18 ENCOUNTER — Other Ambulatory Visit: Payer: Self-pay

## 2020-12-18 ENCOUNTER — Other Ambulatory Visit: Payer: Self-pay | Admitting: Family Medicine

## 2020-12-18 DIAGNOSIS — Z951 Presence of aortocoronary bypass graft: Secondary | ICD-10-CM | POA: Diagnosis not present

## 2020-12-18 DIAGNOSIS — E1140 Type 2 diabetes mellitus with diabetic neuropathy, unspecified: Secondary | ICD-10-CM

## 2020-12-18 LAB — GLUCOSE, CAPILLARY
Glucose-Capillary: 177 mg/dL — ABNORMAL HIGH (ref 70–99)
Glucose-Capillary: 163 mg/dL — ABNORMAL HIGH (ref 70–99)

## 2020-12-18 NOTE — Progress Notes (Signed)
Daily Session Note  Patient Details  Name: Ronald Roth MRN: [id number] Date of Birth: [birth year] Referring Provider:   Flowsheet Row Cardiac Rehab from 12/16/2020 in [facility] Cardiac and Pulmonary Rehab  Referring Provider [name] MD       Encounter Date: 12/18/2020  Check In:  Session Check In - 12/18/20 1333       Check-In   Supervising physician immediately available to respond to emergencies See telemetry face sheet for immediately available ER MD    Location [facility]-Cardiac & Pulmonary Rehab    Staff Present Renita Papa, RN BSN;Joseph Wisacky, RCP,RRT,BSRT;Jessica [place], [place], RCEP, CCRP, CCET    Virtual Visit No    Medication changes reported     No    Fall or balance concerns reported    No    Warm-up and Cool-down Performed on first and last piece of equipment    Resistance Training Performed Yes    VAD Patient? No    PAD/SET Patient? No      Pain Assessment   Currently in Pain? No/denies                Social History   Tobacco Use  Smoking Status Former   Packs/day: 1.00   Years: 30.00   Pack years: 30.00   Types: Cigarettes   Quit date: 04/18/1991   Years since quitting: 29.6  Smokeless Tobacco Never    Goals Met:  Independence with exercise equipment Exercise tolerated well No report of cardiac concerns or symptoms Strength training completed today  Goals Unmet:  Not Applicable  Comments: Pt able to follow exercise prescription today without complaint.  Will continue to monitor for progression.    Dr. Emily Filbert is Medical Director for [place].  Dr. Ottie Glazier is Medical Director for [facility] Pulmonary Rehabilitation.

## 2020-12-19 NOTE — Telephone Encounter (Signed)
Medication was last filled by cardiologist, Dr. [name]. Patient will need to call cardiologist for medication refill. Patient called, no answer. Left message to return call.

## 2020-12-22 ENCOUNTER — Other Ambulatory Visit: Payer: Self-pay

## 2020-12-22 DIAGNOSIS — Z951 Presence of aortocoronary bypass graft: Secondary | ICD-10-CM | POA: Diagnosis not present

## 2020-12-22 LAB — GLUCOSE, CAPILLARY
Glucose-Capillary: 137 mg/dL — ABNORMAL HIGH (ref 70–99)
Glucose-Capillary: 148 mg/dL — ABNORMAL HIGH (ref 70–99)

## 2020-12-22 NOTE — Progress Notes (Signed)
Daily Session Note  Patient Details  Name: Ronald Roth MRN: [id number] Date of Birth: 05/04/1951 Referring Provider:   Flowsheet Row Cardiac Rehab from 12/16/2020 in [facility] Cardiac and Pulmonary Rehab  Referring Provider Gollan, TImothy MD       Encounter Date: 12/22/2020  Check In:  Session Check In - 12/22/20 1342       Check-In   Supervising physician immediately available to respond to emergencies See telemetry face sheet for immediately available ER MD    Location [facility]-Cardiac & Pulmonary Rehab    Staff Present Kelly Bollinger, MPA, RN;Laureen Brown, BS, RRT, CPFT;Amanda Sommer, BA, ACSM CEP, Exercise Physiologist    Virtual Visit No    Medication changes reported     No    Fall or balance concerns reported    No    Warm-up and Cool-down Performed on first and last piece of equipment    Resistance Training Performed Yes    VAD Patient? No    PAD/SET Patient? No      Pain Assessment   Currently in Pain? No/denies                Social History   Tobacco Use  Smoking Status Former   Packs/day: 1.00   Years: 30.00   Pack years: 30.00   Types: Cigarettes   Quit date: 04/18/1991   Years since quitting: 29.7  Smokeless Tobacco Never    Goals Met:  Independence with exercise equipment Exercise tolerated well No report of cardiac concerns or symptoms Strength training completed today  Goals Unmet:  Not Applicable  Comments: Pt able to follow exercise prescription today without complaint.  Will continue to monitor for progression.    Dr. Mark Miller is Medical Director for [facility].  Dr. Fuad Aleskerov is Medical Director for [facility] Pulmonary Rehabilitation. 

## 2020-12-24 ENCOUNTER — Encounter: Payer: Self-pay | Admitting: *Deleted

## 2020-12-24 ENCOUNTER — Other Ambulatory Visit: Payer: Self-pay

## 2020-12-24 DIAGNOSIS — Z951 Presence of aortocoronary bypass graft: Secondary | ICD-10-CM

## 2020-12-24 NOTE — Progress Notes (Signed)
Daily Session Note  Patient Details  Name: Ronald Roth MRN: [id number] Date of Birth: 08-09-1950 Referring Provider:   Flowsheet Row Cardiac Rehab from 12/16/2020 in [facility] Cardiac and Pulmonary Rehab  Referring Provider [name] MD       Encounter Date: 12/24/2020  Check In:  Session Check In - 12/24/20 1319       Check-In   Supervising physician immediately available to respond to emergencies See telemetry face sheet for immediately available ER MD    Location [facility]-Cardiac & Pulmonary Rehab    Staff Present Birdie Sons, MPA, Nino Glow, MS, ASCM CEP, Exercise Physiologist;Laureen Owens [place], BS, RRT, CPFT    Virtual Visit No    Medication changes reported     No    Fall or balance concerns reported    No    Warm-up and Cool-down Performed on first and last piece of equipment    Resistance Training Performed Yes    VAD Patient? No    PAD/SET Patient? No      Pain Assessment   Currently in Pain? No/denies                Social History   Tobacco Use  Smoking Status Former   Packs/day: 1.00   Years: 30.00   Pack years: 30.00   Types: Cigarettes   Quit date: 04/18/1991   Years since quitting: 29.7  Smokeless Tobacco Never    Goals Met:  Independence with exercise equipment Exercise tolerated well No report of cardiac concerns or symptoms Strength training completed today  Goals Unmet:  Not Applicable  Comments: Pt able to follow exercise prescription today without complaint.  Will continue to monitor for progression. Reviewed home exercise with pt today.  Pt plans to walking with dog and staff videos at home for exercise.  Reviewed THR, pulse, RPE, sign and symptoms, pulse oximetery and when to call 911 or MD.  Also discussed weather considerations and indoor options.  Pt voiced understanding.   Dr. Emily Filbert is Medical Director for [place].  Dr. Ottie Glazier is Medical Director for [facility] Pulmonary  Rehabilitation.

## 2020-12-24 NOTE — Progress Notes (Signed)
Cardiac Individual Treatment Plan  Patient Details  Name: Ronald Roth MRN: [id number] Date of Birth: 12-18-1950 Referring Provider:   Flowsheet Row [facility]  Referring Provider Julien NordmannGollan, TImothy MD       Initial Encounter Date:  Flowsheet Row [facility]  Date 12/16/20       Visit Diagnosis: S/P CABG x 4  Patient's Home Medications on Admission:  Current Outpatient Medications:    acetaminophen (TYLENOL) 500 MG tablet, Take 2 tablets (1,000 mg total) by mouth every 6 (six) hours as needed., Disp: 30 tablet, Rfl: 0   aspirin EC 81 MG tablet, Take 81 mg by mouth in the morning. Swallow whole., Disp: , Rfl:    atorvastatin (LIPITOR) 80 MG tablet, Take 1 tablet (80 mg total) by mouth in the morning. Take 80 mg by mouth in the morning., Disp: 90 tablet, Rfl: 3   diphenhydrAMINE (BENADRYL) 25 MG tablet, Take 75 mg by mouth at bedtime., Disp: , Rfl:    ezetimibe (ZETIA) 10 MG tablet, Take 1 tablet (10 mg total) by mouth in the morning., Disp: 90 tablet, Rfl: 3   fenofibrate (TRICOR) 145 MG tablet, Take 145 mg by mouth in the morning., Disp: , Rfl:    JARDIANCE 25 MG TABS tablet, Take 1 tablet (25 mg total) by mouth daily before breakfast., Disp: 90 tablet, Rfl: 3   metoprolol succinate (TOPROL-XL) 50 MG 24 hr tablet, Take 1 tablet (50 mg total) by mouth daily. Take with or immediately following a meal., Disp: 90 tablet, Rfl: 3   nitroGLYCERIN (NITROSTAT) 0.4 MG SL tablet, Place 1 tablet (0.4 mg total) under the tongue every 5 (five) minutes x 3 doses as needed for chest pain., Disp: 25 tablet, Rfl: 3   omeprazole (PRILOSEC) 20 MG capsule, Take 20 mg by mouth in the morning., Disp: , Rfl:    OZEMPIC, 0.25 OR 0.5 MG/DOSE, 2 MG/1.5ML SOPN, Inject 0.5 mg into the skin every Monday., Disp: , Rfl:    sacubitril-valsartan (ENTRESTO) 24-26 MG, Take 1 tablet by mouth 2 (two) times daily.,  Disp: 60 tablet, Rfl: 11   Vitamin D, Ergocalciferol, (DRISDOL) 1.25 MG (50000 UNIT) CAPS capsule, Take 50,000 Units by mouth 2 (two) times a week. Sundays & Thursdays, Disp: , Rfl:   Past Medical History: Past Medical History:  Diagnosis Date   Anxiety    Coronary artery disease    Diabetes mellitus without complication (HCC)    GERD (gastroesophageal reflux disease)    Heart attack (HCC) 2012   Hypertension    Sciatica     Tobacco Use: Social History   Tobacco Use  Smoking Status Former   Packs/day: 1.00   Years: 30.00   Pack years: 30.00   Types: Cigarettes   Quit date: 04/18/1991   Years since quitting: 29.7  Smokeless Tobacco Never    Labs: Recent Review Flowsheet Data     Labs for ITP Cardiac and Pulmonary Rehab Latest Ref Rng & Units 10/31/2020 10/31/2020 10/31/2020 10/31/2020 11/19/2020   Cholestrol 100 - 199 mg/dL - - - - 782135   LDLCALC 0 - 99 mg/dL - - - - 79   HDL >95>39 mg/dL - - - - 62(Z30(L)   Trlycerides 0 - 149 mg/dL - - - - 308149   Hemoglobin A1c 4.8 - 5.6 % - - - - 7.3(H)   PHART 7.350 - 7.450 - 7.352 7.343(L) 7.303(L) -  PCO2ART 32.0 - 48.0 mmHg - 43.0 44.9 43.8 -   HCO3 20.0 - 28.0 mmol/L - 24.4 24.4 21.6 -   TCO2 22 - 32 mmol/L -   ACIDBASEDEF 0.0 - 2.0 mmol/L - 2.0 2.0 5.0(H) -   O2SAT % - 95.0 98.0 97.0 -        Exercise Target Goals: Exercise Program Goal: Individual exercise prescription set using results from initial 6 min walk test and THRR while considering  patient's activity barriers and safety.   Exercise Prescription Goal: Initial exercise prescription builds to 30-45 minutes a day of aerobic activity, 2-3 days per week.  Home exercise guidelines will be given to patient during program as part of exercise prescription that the participant will acknowledge.   Education: Aerobic Exercise: - Group verbal and visual presentation on the components of exercise prescription. Introduces F.I.T.T principle from ACSM for exercise  prescriptions.  Reviews F.I.T.T. principles of aerobic exercise including progression. Written material given at graduation.   Education: Resistance Exercise: - Group verbal and visual presentation on the components of exercise prescription. Introduces F.I.T.T principle from ACSM for exercise prescriptions  Reviews F.I.T.T. principles of resistance exercise including progression. Written material given at graduation.    Education: Exercise & Equipment Safety: - Individual verbal instruction and demonstration of equipment use and safety with use of the equipment. Flowsheet Row Cardiac Rehab from 12/17/2020 in [facility] Cardiac and Pulmonary Rehab  Education need identified 12/16/20  Date 12/16/20  Educator KL  Instruction Review Code 1- Verbalizes Understanding       Education: Exercise Physiology & General Exercise Guidelines: - Group verbal and written instruction with models to review the exercise physiology of the cardiovascular system and associated critical values. Provides general exercise guidelines with specific guidelines to those with heart or lung disease.    Education: Flexibility, Balance, Mind/Body Relaxation: - Group verbal and visual presentation with interactive activity on the components of exercise prescription. Introduces F.I.T.T principle from ACSM for exercise prescriptions. Reviews F.I.T.T. principles of flexibility and balance exercise training including progression. Also discusses the mind body connection.  Reviews various relaxation techniques to help reduce and manage stress (i.e. Deep breathing, progressive muscle relaxation, and visualization). Balance handout provided to take home. Written material given at graduation.   Activity Barriers & Risk Stratification:  Activity Barriers & Cardiac Risk Stratification - 12/16/20 1523       Activity Barriers & Cardiac Risk Stratification   Activity Barriers Joint Problems;Balance Concerns;Decreased Ventricular  Function;Other (comment);Deconditioning    Comments Limited ROM- right shoulder    Cardiac Risk Stratification High             6 Minute Walk:  6 Minute Walk     Row Name 12/16/20 1530         6 Minute Walk   Phase Initial     Distance 1225 feet     Walk Time 6 minutes     # of Rest Breaks 0     MPH 2.32     METS 2.9     RPE 12     Perceived Dyspnea  0     VO2 Peak 10.15     Symptoms No     Resting HR 96 bpm     Resting BP 134/68     Resting Oxygen Saturation  96 %     Exercise Oxygen Saturation  during 6 min walk 97 %     Max Ex. HR 106 bpm  Max Ex. BP 140/64     2 Minute Post BP 122/68              Oxygen Initial Assessment:   Oxygen Re-Evaluation:   Oxygen Discharge (Final Oxygen Re-Evaluation):   Initial Exercise Prescription:  Initial Exercise Prescription - 12/16/20 1500       Date of Initial Exercise RX and Referring Provider   Date 12/16/20    Referring Provider [name], TImothy MD      Treadmill   MPH 2.3    Grade 0.5    Minutes 15    METs 2.92      Recumbant Bike   Level 2    RPM 60    Watts 25    Minutes 15    METs 2.9      NuStep   Level 2    SPM 80    Minutes 15    METs 2.9      REL-XR   Level 1    Speed 50    Minutes 15    METs 2.9      T5 Nustep   Level 1    SPM 80    Minutes 15    METs 2.9      Prescription Details   Frequency (times per week) 3    Duration Progress to 30 minutes of continuous aerobic without signs/symptoms of physical distress      Intensity   THRR 40-80% of Max Heartrate 117-138    Ratings of Perceived Exertion 11-13    Perceived Dyspnea 0-4      Progression   Progression Continue to progress workloads to maintain intensity without signs/symptoms of physical distress.      Resistance Training   Training Prescription Yes    Weight 4 lb    Reps 10-15             Perform Capillary Blood Glucose checks as needed.  Exercise Prescription Changes:   Exercise Prescription  Changes     Row Name 12/22/20 1200             Response to Exercise   Blood Pressure (Admit) 142/74       Blood Pressure (Exercise) 138/64       Blood Pressure (Exit) 128/62       Heart Rate (Admit) 79 bpm       Heart Rate (Exercise) 111 bpm       Heart Rate (Exit) 101 bpm       Rating of Perceived Exertion (Exercise) 12       Comments second day exercise       Duration Progress to 30 minutes of  aerobic without signs/symptoms of physical distress       Intensity THRR unchanged               Progression     Progression Continue to progress workloads to maintain intensity without signs/symptoms of physical distress.       Average METs 2.93               Resistance Training     Training Prescription Yes       Weight 4 lb       Reps 10-15               Treadmill     MPH 2.5       Grade 1       Minutes 15       METs 3.26  REL-XR     Level 3       Speed 50       Minutes 15       METs 2.6               Exercise Comments:   Exercise Goals and Review:   Exercise Goals     Row Name 12/16/20 1545             Exercise Goals   Increase Physical Activity Yes       Intervention Provide advice, education, support and counseling about physical activity/exercise needs.;Develop an individualized exercise prescription for aerobic and resistive training based on initial evaluation findings, risk stratification, comorbidities and participant's personal goals.       Expected Outcomes Short Term: Attend rehab on a regular basis to increase amount of physical activity.;Long Term: Add in home exercise to make exercise part of routine and to increase amount of physical activity.;Long Term: Exercising regularly at least 3-5 days a week.       Increase Strength and Stamina Yes       Intervention Provide advice, education, support and counseling about physical activity/exercise needs.;Develop an individualized exercise prescription for aerobic and resistive  training based on initial evaluation findings, risk stratification, comorbidities and participant's personal goals.       Expected Outcomes Short Term: Increase workloads from initial exercise prescription for resistance, speed, and METs.;Short Term: Perform resistance training exercises routinely during rehab and add in resistance training at home;Long Term: Improve cardiorespiratory fitness, muscular endurance and strength as measured by increased METs and functional capacity ( )       Able to understand and use rate of perceived exertion (RPE) scale Yes       Intervention Provide education and explanation on how to use RPE scale       Expected Outcomes Short Term: Able to use RPE daily in rehab to express subjective intensity level;Long Term:  Able to use RPE to guide intensity level when exercising independently       Able to understand and use Dyspnea scale Yes       Intervention Provide education and explanation on how to use Dyspnea scale       Expected Outcomes Short Term: Able to use Dyspnea scale daily in rehab to express subjective sense of shortness of breath during exertion;Long Term: Able to use Dyspnea scale to guide intensity level when exercising independently       Knowledge and understanding of Target Heart Rate Range (THRR) Yes       Intervention Provide education and explanation of THRR including how the numbers were predicted and where they are located for reference       Expected Outcomes Short Term: Able to state/look up THRR;Long Term: Able to use THRR to govern intensity when exercising independently;Short Term: Able to use daily as guideline for intensity in rehab       Able to check pulse independently Yes       Intervention Provide education and demonstration on how to check pulse in carotid and radial arteries.;Review the importance of being able to check your own pulse for safety during independent exercise       Expected Outcomes Short Term: Able to explain why pulse  checking is important during independent exercise;Long Term: Able to check pulse independently and accurately       Understanding of Exercise Prescription Yes       Intervention Provide education, explanation, and written materials on patient's  individual exercise prescription       Expected Outcomes Short Term: Able to explain program exercise prescription;Long Term: Able to explain home exercise prescription to exercise independently                Exercise Goals Re-Evaluation :  Exercise Goals Re-Evaluation     Row Name 12/17/20 1525 12/22/20 1301           Exercise Goal Re-Evaluation   Exercise Goals Review Increase Physical Activity;Increase Strength and Stamina;Able to understand and use rate of perceived exertion (RPE) scale;Knowledge and understanding of Target Heart Rate Range (THRR);Able to check pulse independently;Understanding of Exercise Prescription Increase Physical Activity;Increase Strength and Stamina      Comments Reviewed RPE and dyspnea scales, THR and program prescription with pt today.  Pt voiced understanding and was given a copy of goals to take home. Ronald Roth has done well in his first days.  He has increased levels on TM and XR.  Staff will monitor progress.      Expected Outcomes Short: Use RPE daily to regulate intensity. Long: Follow program prescription in THR. Short:  attend consistently Long: build overall stamina               Discharge Exercise Prescription (Final Exercise Prescription Changes):  Exercise Prescription Changes - 12/22/20 1200       Response to Exercise   Blood Pressure (Admit) 142/74    Blood Pressure (Exercise) 138/64    Blood Pressure (Exit) 128/62    Heart Rate (Admit) 79 bpm    Heart Rate (Exercise) 111 bpm    Heart Rate (Exit) 101 bpm    Rating of Perceived Exertion (Exercise) 12    Comments second day exercise    Duration Progress to 30 minutes of  aerobic without signs/symptoms of physical distress    Intensity THRR  unchanged      Progression   Progression Continue to progress workloads to maintain intensity without signs/symptoms of physical distress.    Average METs 2.93      Resistance Training   Training Prescription Yes    Weight 4 lb    Reps 10-15      Treadmill   MPH 2.5    Grade 1    Minutes 15    METs 3.26      REL-XR   Level 3    Speed 50    Minutes 15    METs 2.6             Nutrition:  Target Goals: Understanding of nutrition guidelines, daily intake of sodium 1500mg , cholesterol 200mg , calories 30% from fat and 7% or less from saturated fats, daily to have 5 or more servings of fruits and vegetables.  Education: All About Nutrition: -Group instruction provided by verbal, written material, interactive activities, discussions, models, and posters to present general guidelines for heart healthy nutrition including fat, fiber, MyPlate, the role of sodium in heart healthy nutrition, utilization of the nutrition label, and utilization of this knowledge for meal planning. Follow up email sent as well. Written material given at graduation.   Biometrics:  Pre Biometrics - 12/16/20 1522       Pre Biometrics   Height 5' 10.75" (1.797 m)    Weight 196 lb 3.2 oz (89 kg)    BMI (Calculated) 27.56    Single Leg Stand 6.22 seconds              Nutrition Therapy Plan and Nutrition Goals:   Nutrition  Assessments:  MEDIFICTS Score Key: ?70 Need to make dietary changes  40-70 Heart Healthy Diet ? 40 Therapeutic Level Cholesterol Diet  Flowsheet Row Cardiac Rehab from 12/16/2020 in [facility] Cardiac and Pulmonary Rehab  Picture Your Plate Total Score on Admission 54      Picture Your Plate Scores: <16 Unhealthy dietary pattern with much room for improvement. 41-50 Dietary pattern unlikely to meet recommendations for good health and room for improvement. 51-60 More healthful dietary pattern, with some room for improvement.  >60 Healthy dietary pattern, although there may  be some specific behaviors that could be improved.    Nutrition Goals Re-Evaluation:   Nutrition Goals Discharge (Final Nutrition Goals Re-Evaluation):   Psychosocial: Target Goals: Acknowledge presence or absence of significant depression and/or stress, maximize coping skills, provide positive support system. Participant is able to verbalize types and ability to use techniques and skills needed for reducing stress and depression.   Education: Stress, Anxiety, and Depression - Group verbal and visual presentation to define topics covered.  Reviews how body is impacted by stress, anxiety, and depression.  Also discusses healthy ways to reduce stress and to treat/manage anxiety and depression.  Written material given at graduation.   Education: Sleep Hygiene -Provides group verbal and written instruction about how sleep can affect your health.  Define sleep hygiene, discuss sleep cycles and impact of sleep habits. Review good sleep hygiene tips.    Initial Review & Psychosocial Screening:  Initial Psych Review & Screening - 12/08/20 1542       Initial Review   Current issues with Current Stress Concerns      Family Dynamics   Good Support System? Yes   wife, children, grandchildren     Barriers   Psychosocial barriers to participate in program There are no identifiable barriers or psychosocial needs.;The patient should benefit from training in stress management and relaxation.      Screening Interventions   Interventions Encouraged to exercise;Provide feedback about the scores to participant;To provide support and resources with identified psychosocial needs    Expected Outcomes Short Term goal: Utilizing psychosocial counselor, staff and physician to assist with identification of specific Stressors or current issues interfering with healing process. Setting desired goal for each stressor or current issue identified.;Long Term Goal: Stressors or current issues are controlled or  eliminated.;Short Term goal: Identification and review with participant of any Quality of Life or Depression concerns found by scoring the questionnaire.;Long Term goal: The participant improves quality of Life and PHQ9 Scores as seen by post scores and/or verbalization of changes             Quality of Life Scores:   Quality of Life - 12/16/20 1527       Quality of Life   Select Quality of Life      Quality of Life Scores   Health/Function Pre 23.2 %    Socioeconomic Pre 25.13 %    Psych/Spiritual Pre 23.14 %    Family Pre 27.6 %    GLOBAL Pre 24.26 %            Scores of 19 and below usually indicate a poorer quality of life in these areas.  A difference of  2-3 points is a clinically meaningful difference.  A difference of 2-3 points in the total score of the Quality of Life Index has been associated with significant improvement in overall quality of life, self-image, physical symptoms, and general health in studies assessing change in quality of life.  PHQ-9: Recent Review Flowsheet Data     Depression screen [facility] 2/9 12/16/2020 11/21/2020 08/22/2020   Decreased Interest 0 0 0   Down, Depressed, Hopeless 0 0 0   PHQ - 2 Score 0 0 0   Altered sleeping 0 0 -   Tired, decreased energy 0 1 -   Change in appetite 0 0 -   Feeling bad or failure about yourself  0 0 -   Trouble concentrating 0 0 -   Moving slowly or fidgety/restless 0 0 -   Suicidal thoughts 0 0 -   PHQ-9 Score 0 1 -   Difficult doing work/chores Not difficult at all Not difficult at all -      Interpretation of Total Score  Total Score Depression Severity:  1-4 = Minimal depression, 5-9 = Mild depression, 10-14 = Moderate depression, 15-19 = Moderately severe depression, 20-27 = Severe depression   Psychosocial Evaluation and Intervention:  Psychosocial Evaluation - 12/08/20 1554       Psychosocial Evaluation & Interventions   Interventions Encouraged to exercise with the program and follow exercise  prescription    Comments Ronald Roth is coming to cardiac rehab post CABG x 4 in mid June. He states he is doing well post op and starting to get back to his usual routine. He is retired from [employer] and likes to stay active.  He and his wife have 6 daughters and 2 sons that live out of town but are very supportive. He made some big lifestyle changes when he had his stents years ago. He and his wife like to kayak and be active so he is ready to get back to that in a safe way. He did report some anxiety/stress from the surgery where he felt the need to make videos for his children in case he didn't make it. He states that he didn't want them to know about it but he is starting to feel more confident now that its been weeks post op. His hope is this program will help build on his foundation of a heart healthy lifestyle    Expected Outcomes Short: attend cardiac rehab for education and exericse. Long: develop and maintain positive self care habits.    Continue Psychosocial Services  Follow up required by staff             Psychosocial Re-Evaluation:   Psychosocial Discharge (Final Psychosocial Re-Evaluation):   Vocational Rehabilitation: Provide vocational rehab assistance to qualifying candidates.   Vocational Rehab Evaluation & Intervention:  Vocational Rehab - 12/08/20 1542       Initial Vocational Rehab Evaluation & Intervention   Assessment shows need for Vocational Rehabilitation No             Education: Education Goals: Education classes will be provided on a variety of topics geared toward better understanding of heart health and risk factor modification. Participant will state understanding/return demonstration of topics presented as noted by education test scores.  Learning Barriers/Preferences:  Learning Barriers/Preferences - 12/08/20 1541       Learning Barriers/Preferences   Learning Barriers None    Learning Preferences None             General  Cardiac Education Topics:  AED/CPR: - Group verbal and written instruction with the use of models to demonstrate the basic use of the AED with the basic ABC's of resuscitation.   Anatomy and Cardiac Procedures: - Group verbal and visual presentation and models provide information about basic cardiac anatomy  and function. Reviews the testing methods done to diagnose heart disease and the outcomes of the test results. Describes the treatment choices: Medical Management, Angioplasty, or Coronary Bypass Surgery for treating various heart conditions including Myocardial Infarction, Angina, Valve Disease, and Cardiac Arrhythmias.  Written material given at graduation.   Medication Safety: - Group verbal and visual instruction to review commonly prescribed medications for heart and lung disease. Reviews the medication, class of the drug, and side effects. Includes the steps to properly store meds and maintain the prescription regimen.  Written material given at graduation.   Intimacy: - Group verbal instruction through game format to discuss how heart and lung disease can affect sexual intimacy. Written material given at graduation..   Know Your Numbers and Heart Failure: - Group verbal and visual instruction to discuss disease risk factors for cardiac and pulmonary disease and treatment options.  Reviews associated critical values for Overweight/Obesity, Hypertension, Cholesterol, and Diabetes.  Discusses basics of heart failure: signs/symptoms and treatments.  Introduces Heart Failure Zone chart for action plan for heart failure.  Written material given at graduation.   Infection Prevention: - Provides verbal and written material to individual with discussion of infection control including proper hand washing and proper equipment cleaning during exercise session. Flowsheet Row Cardiac Rehab from 12/17/2020 in [facility] Cardiac and Pulmonary Rehab  Education need identified 12/16/20  Date 12/16/20   Educator KL  Instruction Review Code 1- Verbalizes Understanding       Falls Prevention: - Provides verbal and written material to individual with discussion of falls prevention and safety. Flowsheet Row Cardiac Rehab from 12/17/2020 in [facility] Cardiac and Pulmonary Rehab  Education need identified 12/16/20  Date 12/16/20  Educator KL  Instruction Review Code 1- Verbalizes Understanding       Other: -Provides group and verbal instruction on various topics (see comments)   Knowledge Questionnaire Score:  Knowledge Questionnaire Score - 12/16/20 1527       Knowledge Questionnaire Score   Pre Score 24/26: Angina, Exercise             Core Components/Risk Factors/Patient Goals at Admission:  Personal Goals and Risk Factors at Admission - 12/16/20 1546       Core Components/Risk Factors/Patient Goals on Admission    Weight Management Yes;Weight Loss    Intervention Weight Management: Develop a combined nutrition and exercise program designed to reach desired caloric intake, while maintaining appropriate intake of nutrient and fiber, sodium and fats, and appropriate energy expenditure required for the weight goal.;Weight Management: Provide education and appropriate resources to help participant work on and attain dietary goals.;Weight Management/Obesity: Establish reasonable short term and long term weight goals.    Admit Weight 196 lb (88.9 kg)    Goal Weight: Short Term 191 lb (86.6 kg)    Goal Weight: Long Term 186 lb (84.4 kg)    Expected Outcomes Short Term: Continue to assess and modify interventions until short term weight is achieved;Long Term: Adherence to nutrition and physical activity/exercise program aimed toward attainment of established weight goal;Weight Loss: Understanding of general recommendations for a balanced deficit meal plan, which promotes 1-2 lb weight loss per week and includes a negative energy balance of [phone] kcal/d;Understanding recommendations  for meals to include 15-35% energy as protein, 25-35% energy from fat, 35-60% energy from carbohydrates, less than  of dietary cholesterol, 20-35 gm of total fiber daily;Understanding of distribution of calorie intake throughout the day with the consumption of 4-5 meals/snacks    Diabetes Yes  Intervention Provide education about signs/symptoms and action to take for hypo/hyperglycemia.;Provide education about proper nutrition, including hydration, and aerobic/resistive exercise prescription along with prescribed medications to achieve blood glucose in normal ranges: Fasting glucose 65-99 mg/dL    Hypertension Yes    Intervention Provide education on lifestyle modifcations including regular physical activity/exercise, weight management, moderate sodium restriction and increased consumption of fresh fruit, vegetables, and low fat dairy, alcohol moderation, and smoking cessation.;Monitor prescription use compliance.    Expected Outcomes Short Term: Continued assessment and intervention until BP is < 140/37mm HG in hypertensive participants. < 130/48mm HG in hypertensive participants with diabetes, heart failure or chronic kidney disease.;Long Term: Maintenance of blood pressure at goal levels.    Lipids Yes    Intervention Provide education and support for participant on nutrition & aerobic/resistive exercise along with prescribed medications to achieve LDL 70mg , HDL >40mg .             Education:Diabetes - Individual verbal and written instruction to review signs/symptoms of diabetes, desired ranges of glucose level fasting, after meals and with exercise. Acknowledge that pre and post exercise glucose checks will be done for 3 sessions at entry of program. Flowsheet Row Cardiac Rehab from 12/17/2020 in [facility] Cardiac and Pulmonary Rehab  Education need identified 12/16/20  Date 12/16/20  Educator KL  Instruction Review Code 1- Verbalizes Understanding       Core Components/Risk  Factors/Patient Goals Review:    Core Components/Risk Factors/Patient Goals at Discharge (Final Review):    ITP Comments:  ITP Comments     Row Name 12/08/20 1539 12/16/20 1513 12/17/20 1524 12/24/20 0744     ITP Comments Initial telephone orientation completed. Diagnosis can be found in [facility] 6/17. EP orientation scheduled for Tuesday 8/2 at 10am. Completed and gym orientation. Initial ITP created and sent for review to Dr. Bethann Punches, Medical Director. First full day of exercise!  Patient was oriented to gym and equipment including functions, settings, policies, and procedures.  Patient's individual exercise prescription and treatment plan were reviewed.  All starting workloads were established based on the results of the 6 minute walk test done at initial orientation visit.  The plan for exercise progression was also introduced and progression will be customized based on patient's performance and goals. 30 Day review completed. Medical Director ITP review done, changes made as directed, and signed approval by Medical Director.    New to program             Comments:

## 2020-12-29 ENCOUNTER — Other Ambulatory Visit: Payer: Self-pay | Admitting: Family Medicine

## 2020-12-29 DIAGNOSIS — E1140 Type 2 diabetes mellitus with diabetic neuropathy, unspecified: Secondary | ICD-10-CM

## 2020-12-31 ENCOUNTER — Other Ambulatory Visit: Payer: Self-pay

## 2020-12-31 DIAGNOSIS — Z951 Presence of aortocoronary bypass graft: Secondary | ICD-10-CM | POA: Diagnosis not present

## 2020-12-31 NOTE — Progress Notes (Signed)
Completed initial RD consultation ?

## 2020-12-31 NOTE — Progress Notes (Signed)
Daily Session Note  Patient Details  Name: Ronald Roth MRN: [id number] Date of Birth: 1951/01/10 Referring Provider:   Flowsheet Row Cardiac Rehab from 12/16/2020 in [facility] Cardiac and Pulmonary Rehab  Referring Provider [name] MD       Encounter Date: 12/31/2020  Check In:  Session Check In - 12/31/20 1328       Check-In   Supervising physician immediately available to respond to emergencies See telemetry face sheet for immediately available ER MD    Location [facility]-Cardiac & Pulmonary Rehab    Staff Present Birdie Sons, MPA, Nino Glow, MS, ASCM CEP, Exercise Physiologist;Joseph Tessie Fass, [place]    Virtual Visit No    Medication changes reported     No    Fall or balance concerns reported    No    Warm-up and Cool-down Performed on first and last piece of equipment    Resistance Training Performed Yes    VAD Patient? No    PAD/SET Patient? No      Pain Assessment   Currently in Pain? No/denies                Social History   Tobacco Use  Smoking Status Former   Packs/day: 1.00   Years: 30.00   Pack years: 30.00   Types: Cigarettes   Quit date: 04/18/1991   Years since quitting: 29.7  Smokeless Tobacco Never    Goals Met:  Independence with exercise equipment Exercise tolerated well No report of cardiac concerns or symptoms Strength training completed today  Goals Unmet:  Not Applicable  Comments: Pt able to follow exercise prescription today without complaint.  Will continue to monitor for progression.    Dr. Emily Filbert is Medical Director for [place].  Dr. Ottie Glazier is Medical Director for [facility] Pulmonary Rehabilitation.

## 2021-01-01 ENCOUNTER — Encounter: Payer: Managed Care, Other (non HMO) | Admitting: *Deleted

## 2021-01-01 ENCOUNTER — Other Ambulatory Visit: Payer: Self-pay

## 2021-01-01 DIAGNOSIS — Z951 Presence of aortocoronary bypass graft: Secondary | ICD-10-CM | POA: Diagnosis not present

## 2021-01-01 NOTE — Progress Notes (Signed)
Daily Session Note  Patient Details  Name: Ronald Roth MRN: [id number] Date of Birth: 24-Mar-1951 Referring Provider:   Flowsheet Row Cardiac Rehab from 12/16/2020 in [facility] Cardiac and Pulmonary Rehab  Referring Provider [name] MD       Encounter Date: 01/01/2021  Check In:  Session Check In - 01/01/21 1335       Check-In   Supervising physician immediately available to respond to emergencies See telemetry face sheet for immediately available ER MD    Location [facility]-Cardiac & Pulmonary Rehab    Staff Present Renita Papa, RN BSN;Joseph Tessie Fass, RCP,RRT,BSRT;Melissa [place], [place], LDN    Virtual Visit No    Medication changes reported     No    Fall or balance concerns reported    No    Warm-up and Cool-down Performed on first and last piece of equipment    Resistance Training Performed Yes    VAD Patient? No    PAD/SET Patient? No      Pain Assessment   Currently in Pain? No/denies                Social History   Tobacco Use  Smoking Status Former   Packs/day: 1.00   Years: 30.00   Pack years: 30.00   Types: Cigarettes   Quit date: 04/18/1991   Years since quitting: 29.7  Smokeless Tobacco Never    Goals Met:  Independence with exercise equipment Exercise tolerated well No report of cardiac concerns or symptoms Strength training completed today  Goals Unmet:  Not Applicable  Comments: Pt able to follow exercise prescription today without complaint.  Will continue to monitor for progression.    Dr. Emily Filbert is Medical Director for [place].  Dr. Ottie Glazier is Medical Director for [facility] Pulmonary Rehabilitation.

## 2021-01-05 ENCOUNTER — Other Ambulatory Visit: Payer: Self-pay

## 2021-01-05 ENCOUNTER — Encounter: Payer: Self-pay | Admitting: Family Medicine

## 2021-01-05 DIAGNOSIS — Z951 Presence of aortocoronary bypass graft: Secondary | ICD-10-CM | POA: Diagnosis not present

## 2021-01-05 NOTE — Progress Notes (Signed)
Daily Session Note  Patient Details  Name: Ronald Roth MRN: [id number] Date of Birth: [birth year] Referring Provider:   Flowsheet Row Cardiac Rehab from 12/16/2020 in [facility] Cardiac and Pulmonary Rehab  Referring Provider [name] MD       Encounter Date: 01/05/2021  Check In:  Session Check In - 01/05/21 1346       Check-In   Supervising physician immediately available to respond to emergencies See telemetry face sheet for immediately available ER MD    Location [facility]-Cardiac & Pulmonary Rehab    Staff Present Birdie Sons, MPA, Nino Glow, MS, ASCM CEP, Exercise Physiologist;Joseph Tessie Fass, [place]    Virtual Visit No    Medication changes reported     No    Fall or balance concerns reported    No    Warm-up and Cool-down Performed on first and last piece of equipment    Resistance Training Performed Yes    VAD Patient? No    PAD/SET Patient? No      Pain Assessment   Currently in Pain? No/denies                Social History   Tobacco Use  Smoking Status Former   Packs/day: 1.00   Years: 30.00   Pack years: 30.00   Types: Cigarettes   Quit date: 04/18/1991   Years since quitting: 29.7  Smokeless Tobacco Never    Goals Met:  Independence with exercise equipment Exercise tolerated well No report of cardiac concerns or symptoms Strength training completed today  Goals Unmet:  Not Applicable  Comments: Pt able to follow exercise prescription today without complaint.  Will continue to monitor for progression.    Dr. Emily Filbert is Medical Director for [place].  Dr. Ottie Glazier is Medical Director for [facility] Pulmonary Rehabilitation.

## 2021-01-07 ENCOUNTER — Other Ambulatory Visit: Payer: Self-pay

## 2021-01-07 DIAGNOSIS — Z951 Presence of aortocoronary bypass graft: Secondary | ICD-10-CM

## 2021-01-07 NOTE — Progress Notes (Signed)
Daily Session Note  Patient Details  Name: Ronald Roth MRN: [id number] Date of Birth: 11/28/1950 Referring Provider:   Flowsheet Row Cardiac Rehab from 12/16/2020 in [facility] Cardiac and Pulmonary Rehab  Referring Provider [name] MD       Encounter Date: 01/07/2021  Check In:  Session Check In - 01/07/21 1344       Check-In   Supervising physician immediately available to respond to emergencies See telemetry face sheet for immediately available ER MD    Location [facility]-Cardiac & Pulmonary Rehab    Staff Present Birdie Sons, MPA, Nino Glow, MS, ASCM CEP, Exercise Physiologist;Joseph Tessie Fass, [place]    Virtual Visit No    Medication changes reported     No    Fall or balance concerns reported    No    Warm-up and Cool-down Performed on first and last piece of equipment    Resistance Training Performed Yes    VAD Patient? No    PAD/SET Patient? No      Pain Assessment   Currently in Pain? No/denies                Social History   Tobacco Use  Smoking Status Former   Packs/day: 1.00   Years: 30.00   Pack years: 30.00   Types: Cigarettes   Quit date: 04/18/1991   Years since quitting: 29.7  Smokeless Tobacco Never    Goals Met:  Independence with exercise equipment Exercise tolerated well No report of cardiac concerns or symptoms Strength training completed today  Goals Unmet:  Not Applicable  Comments: Pt able to follow exercise prescription today without complaint.  Will continue to monitor for progression.    Dr. Emily Filbert is Medical Director for [place].  Dr. Ottie Glazier is Medical Director for [facility] Pulmonary Rehabilitation.

## 2021-01-08 ENCOUNTER — Encounter: Payer: Managed Care, Other (non HMO) | Admitting: *Deleted

## 2021-01-08 ENCOUNTER — Other Ambulatory Visit: Payer: Self-pay

## 2021-01-08 DIAGNOSIS — Z951 Presence of aortocoronary bypass graft: Secondary | ICD-10-CM | POA: Diagnosis not present

## 2021-01-08 NOTE — Progress Notes (Signed)
Daily Session Note  Patient Details  Name: Ronald Roth MRN: [id number] Date of Birth: 22-Dec-1950 Referring Provider:   Flowsheet Row Cardiac Rehab from 12/16/2020 in [facility] Cardiac and Pulmonary Rehab  Referring Provider [name] MD       Encounter Date: 01/08/2021  Check In:  Session Check In - 01/08/21 1324       Check-In   Supervising physician immediately available to respond to emergencies See telemetry face sheet for immediately available ER MD    Location [facility]-Cardiac & Pulmonary Rehab    Staff Present Renita Papa, RN BSN;Joseph [place], RCP,RRT,BSRT;Jessica [place], [place], RCEP, CCRP, CCET    Virtual Visit No    Medication changes reported     No    Fall or balance concerns reported    No    Warm-up and Cool-down Performed on first and last piece of equipment    Resistance Training Performed Yes    VAD Patient? No    PAD/SET Patient? No      Pain Assessment   Currently in Pain? No/denies                Social History   Tobacco Use  Smoking Status Former   Packs/day: 1.00   Years: 30.00   Pack years: 30.00   Types: Cigarettes   Quit date: 04/18/1991   Years since quitting: 29.7  Smokeless Tobacco Never    Goals Met:  Independence with exercise equipment Exercise tolerated well No report of concerns or symptoms today Strength training completed today  Goals Unmet:  Not Applicable  Comments: Pt able to follow exercise prescription today without complaint.  Will continue to monitor for progression.    Dr. Emily Filbert is Medical Director for [place].  Dr. Ottie Glazier is Medical Director for [facility] Pulmonary Rehabilitation.

## 2021-01-12 ENCOUNTER — Other Ambulatory Visit: Payer: Self-pay

## 2021-01-12 DIAGNOSIS — Z951 Presence of aortocoronary bypass graft: Secondary | ICD-10-CM

## 2021-01-12 NOTE — Progress Notes (Signed)
Daily Session Note  Patient Details  Name: Ronald Roth MRN: [id number] Date of Birth: May 05, 1951 Referring Provider:   Flowsheet Row Cardiac Rehab from 12/16/2020 in [facility] Cardiac and Pulmonary Rehab  Referring Provider [name] MD       Encounter Date: 01/12/2021  Check In:  Session Check In - 01/12/21 1351       Check-In   Supervising physician immediately available to respond to emergencies See telemetry face sheet for immediately available ER MD    Location [facility]-Cardiac & Pulmonary Rehab    Staff Present Birdie Sons, MPA, Nino Glow, MS, ASCM CEP, Exercise Physiologist;Joseph Tessie Fass, [place]    Virtual Visit No    Medication changes reported     No    Fall or balance concerns reported    No    Warm-up and Cool-down Performed on first and last piece of equipment    Resistance Training Performed Yes    VAD Patient? No    PAD/SET Patient? No      Pain Assessment   Currently in Pain? No/denies                Social History   Tobacco Use  Smoking Status Former   Packs/day: 1.00   Years: 30.00   Pack years: 30.00   Types: Cigarettes   Quit date: 04/18/1991   Years since quitting: 29.7  Smokeless Tobacco Never    Goals Met:  Independence with exercise equipment Exercise tolerated well No report of concerns or symptoms today Strength training completed today  Goals Unmet:  Not Applicable  Comments: Pt able to follow exercise prescription today without complaint.  Will continue to monitor for progression.    Dr. Emily Filbert is Medical Director for [place].  Dr. Ottie Glazier is Medical Director for [facility] Pulmonary Rehabilitation.

## 2021-01-14 ENCOUNTER — Other Ambulatory Visit: Payer: Self-pay

## 2021-01-14 DIAGNOSIS — Z951 Presence of aortocoronary bypass graft: Secondary | ICD-10-CM | POA: Diagnosis not present

## 2021-01-14 NOTE — Progress Notes (Signed)
Daily Session Note  Patient Details  Name: Ronald Roth MRN: [id number] Date of Birth: 03/31/1951 Referring Provider:   Flowsheet Row Cardiac Rehab from 12/16/2020 in [facility] Cardiac and Pulmonary Rehab  Referring Provider [name] MD       Encounter Date: 01/14/2021  Check In:  Session Check In - 01/14/21 1336       Check-In   Supervising physician immediately available to respond to emergencies See telemetry face sheet for immediately available ER MD    Location [facility]-Cardiac & Pulmonary Rehab    Staff Present Birdie Sons, MPA, Nino Glow, MS, ASCM CEP, Exercise Physiologist;Joseph Tessie Fass, [place]    Virtual Visit No    Medication changes reported     No    Fall or balance concerns reported    No    Warm-up and Cool-down Performed on first and last piece of equipment    Resistance Training Performed Yes    VAD Patient? No    PAD/SET Patient? No      Pain Assessment   Currently in Pain? No/denies                Social History   Tobacco Use  Smoking Status Former   Packs/day: 1.00   Years: 30.00   Pack years: 30.00   Types: Cigarettes   Quit date: 04/18/1991   Years since quitting: 29.7  Smokeless Tobacco Never    Goals Met:  Independence with exercise equipment Exercise tolerated well No report of concerns or symptoms today Strength training completed today  Goals Unmet:  Not Applicable  Comments: Pt able to follow exercise prescription today without complaint.  Will continue to monitor for progression.    Dr. Emily Filbert is Medical Director for [place].  Dr. Ottie Glazier is Medical Director for [facility] Pulmonary Rehabilitation.

## 2021-01-15 ENCOUNTER — Encounter: Payer: Managed Care, Other (non HMO) | Attending: Cardiovascular Disease | Admitting: *Deleted

## 2021-01-15 ENCOUNTER — Other Ambulatory Visit: Payer: Self-pay

## 2021-01-15 DIAGNOSIS — Z951 Presence of aortocoronary bypass graft: Secondary | ICD-10-CM | POA: Diagnosis not present

## 2021-01-15 NOTE — Progress Notes (Signed)
Daily Session Note  Patient Details  Name: Ronald Roth MRN: [id number] Date of Birth: 04/22/1951 Referring Provider:   Flowsheet Row Cardiac Rehab from 12/16/2020 in [facility] Cardiac and Pulmonary Rehab  Referring Provider [name] MD       Encounter Date: 01/15/2021  Check In:  Session Check In - 01/15/21 1327       Check-In   Supervising physician immediately available to respond to emergencies See telemetry face sheet for immediately available ER MD    Location [facility]-Cardiac & Pulmonary Rehab    Staff Present Renita Papa, RN BSN;Joseph Tessie Fass, RCP,RRT,BSRT;Melissa [place], [place], LDN    Virtual Visit No    Medication changes reported     No    Fall or balance concerns reported    No    Warm-up and Cool-down Performed on first and last piece of equipment    Resistance Training Performed Yes    VAD Patient? No    PAD/SET Patient? No      Pain Assessment   Currently in Pain? No/denies                Social History   Tobacco Use  Smoking Status Former   Packs/day: 1.00   Years: 30.00   Pack years: 30.00   Types: Cigarettes   Quit date: 04/18/1991   Years since quitting: 29.7  Smokeless Tobacco Never    Goals Met:  Independence with exercise equipment Exercise tolerated well No report of concerns or symptoms today Strength training completed today  Goals Unmet:  Not Applicable  Comments: Pt able to follow exercise prescription today without complaint.  Will continue to monitor for progression.    Dr. Emily Filbert is Medical Director for [place].  Dr. Ottie Glazier is Medical Director for [facility] Pulmonary Rehabilitation.

## 2021-01-21 ENCOUNTER — Other Ambulatory Visit: Payer: Self-pay

## 2021-01-21 ENCOUNTER — Encounter: Payer: Managed Care, Other (non HMO) | Admitting: *Deleted

## 2021-01-21 ENCOUNTER — Encounter: Payer: Self-pay | Admitting: *Deleted

## 2021-01-21 DIAGNOSIS — Z951 Presence of aortocoronary bypass graft: Secondary | ICD-10-CM | POA: Diagnosis not present

## 2021-01-21 NOTE — Progress Notes (Signed)
Cardiac Individual Treatment Plan  Patient Details  Name: Ronald Roth MRN: [id number] Date of Birth: 1950/08/07 Referring Provider:   Flowsheet Row Cardiac Rehab from 12/16/2020 in [facility] Cardiac and Pulmonary Rehab  Referring Provider [name] MD       Initial Encounter Date:  Flowsheet Row Cardiac Rehab from 12/16/2020 in [facility] Cardiac and Pulmonary Rehab  Date 12/16/20       Visit Diagnosis: S/P CABG x 4  Patient's Home Medications on Admission:  Current Outpatient Medications:    acetaminophen (TYLENOL) 500 MG tablet, Take 2 tablets (1,000 mg total) by mouth every 6 (six) hours as needed., Disp: 30 tablet, Rfl: 0   aspirin EC 81 MG tablet, Take 81 mg by mouth in the morning. Swallow whole., Disp: , Rfl:    atorvastatin (LIPITOR) 80 MG tablet, Take 1 tablet (80 mg total) by mouth in the morning. Take 80 mg by mouth in the morning., Disp: 90 tablet, Rfl: 3   diphenhydrAMINE (BENADRYL) 25 MG tablet, Take 75 mg by mouth at bedtime., Disp: , Rfl:    ezetimibe (ZETIA) 10 MG tablet, Take 1 tablet (10 mg total) by mouth in the morning., Disp: 90 tablet, Rfl: 3   fenofibrate (TRICOR) 145 MG tablet, Take 145 mg by mouth in the morning., Disp: , Rfl:    JARDIANCE 25 MG TABS tablet, Take 1 tablet (25 mg total) by mouth daily before breakfast., Disp: 90 tablet, Rfl: 3   metoprolol succinate (TOPROL-XL) 50 MG 24 hr tablet, Take 1 tablet (50 mg total) by mouth daily. Take with or immediately following a meal., Disp: 90 tablet, Rfl: 3   nitroGLYCERIN (NITROSTAT) 0.4 MG SL tablet, Place 1 tablet (0.4 mg total) under the tongue every 5 (five) minutes x 3 doses as needed for chest pain., Disp: 25 tablet, Rfl: 3   omeprazole (PRILOSEC) 20 MG capsule, Take 20 mg by mouth in the morning., Disp: , Rfl:    OZEMPIC, 0.25 OR 0.5 MG/DOSE, 2 MG/1.5ML SOPN, INJECT SUBCUTANEOUSLY 0.5MG ONCE WEEKLY, Disp: 4.5 mL, Rfl: 0   sacubitril-valsartan (ENTRESTO) 24-26 MG, Take 1 tablet by mouth 2 (two) times daily.,  Disp: 60 tablet, Rfl: 11   Vitamin D, Ergocalciferol, (DRISDOL) 1.25 MG (50000 UNIT) CAPS capsule, Take 50,000 Units by mouth 2 (two) times a week. Sundays & Thursdays, Disp: , Rfl:   Past Medical History: Past Medical History:  Diagnosis Date   Anxiety    Coronary artery disease    Diabetes mellitus without complication (HCC)    GERD (gastroesophageal reflux disease)    Heart attack ([place]) 2012   Hypertension    Sciatica     Tobacco Use: Social History   Tobacco Use  Smoking Status Former   Packs/day: 1.00   Years: 30.00   Pack years: 30.00   Types: Cigarettes   Quit date: 04/18/1991   Years since quitting: 29.7  Smokeless Tobacco Never    Labs: Recent Review Flowsheet Data     Labs for ITP Cardiac and Pulmonary Rehab Latest Ref Rng & Units 10/31/2020 10/31/2020 10/31/2020 10/31/2020 11/19/2020   Cholestrol 100 - 199 mg/dL - - - - 135   LDLCALC 0 - 99 mg/dL - - - - 79   HDL >39 mg/dL - - - - 30(L)   Trlycerides 0 - 149 mg/dL - - - - 149   Hemoglobin A1c 4.8 - 5.6 % - - - - 7.3(H)   PHART 7.350 - 7.450 - 7.352 7.343(L) 7.303(L) -   PCO2ART  32.0 - 48.0 mmHg - 43.0 44.9 43.8 -   HCO3 20.0 - 28.0 mmol/L - 24.4 24.4 21.6 -   TCO2 22 - 32 mmol/L _0 -   ACIDBASEDEF 0.0 - 2.0 mmol/L - 2.0 2.0 5.0(H) -   O2SAT % - 95.0 98.0 97.0 -        Exercise Target Goals: Exercise Program Goal: Individual exercise prescription set using results from initial 6 min walk test and THRR while considering  patient's activity barriers and safety.   Exercise Prescription Goal: Initial exercise prescription builds to 30-45 minutes a day of aerobic activity, 2-3 days per week.  Home exercise guidelines will be given to patient during program as part of exercise prescription that the participant will acknowledge.   Education: Aerobic Exercise: - Group verbal and visual presentation on the components of exercise prescription. Introduces F.I.T.T principle from ACSM for exercise  prescriptions.  Reviews F.I.T.T. principles of aerobic exercise including progression. Written material given at graduation. Flowsheet Row Cardiac Rehab from 01/14/2021 in [facility] Cardiac and Pulmonary Rehab  Date 01/07/21  Educator AS  Instruction Review Code 1- Verbalizes Understanding       Education: Resistance Exercise: - Group verbal and visual presentation on the components of exercise prescription. Introduces F.I.T.T principle from ACSM for exercise prescriptions  Reviews F.I.T.T. principles of resistance exercise including progression. Written material given at graduation. Flowsheet Row Cardiac Rehab from 01/14/2021 in [facility] Cardiac and Pulmonary Rehab  Date 01/14/21  Educator AS  Instruction Review Code 1- Verbalizes Understanding        Education: Exercise & Equipment Safety: - Individual verbal instruction and demonstration of equipment use and safety with use of the equipment. Flowsheet Row Cardiac Rehab from 01/14/2021 in [facility] Cardiac and Pulmonary Rehab  Education need identified 12/16/20  Date 12/16/20  Educator [place]  Instruction Review Code 1- Verbalizes Understanding       Education: Exercise Physiology & General Exercise Guidelines: - Group verbal and written instruction with models to review the exercise physiology of the cardiovascular system and associated critical values. Provides general exercise guidelines with specific guidelines to those with heart or lung disease.  Flowsheet Row Cardiac Rehab from 01/14/2021 in [facility] Cardiac and Pulmonary Rehab  Date 12/31/20  Educator AS  Instruction Review Code 1- Verbalizes Understanding       Education: Flexibility, Balance, Mind/Body Relaxation: - Group verbal and visual presentation with interactive activity on the components of exercise prescription. Introduces F.I.T.T principle from ACSM for exercise prescriptions. Reviews F.I.T.T. principles of flexibility and balance exercise training including progression. Also  discusses the mind body connection.  Reviews various relaxation techniques to help reduce and manage stress (i.e. Deep breathing, progressive muscle relaxation, and visualization). Balance handout provided to take home. Written material given at graduation.   Activity Barriers & Risk Stratification:  Activity Barriers & Cardiac Risk Stratification - 12/16/20 1523       Activity Barriers & Cardiac Risk Stratification   Activity Barriers Joint Problems;Balance Concerns;Decreased Ventricular Function;Other (comment);Deconditioning    Comments Limited ROM- right shoulder    Cardiac Risk Stratification High             6 Minute Walk:  6 Minute Walk     Row Name 12/16/20 1530         6 Minute Walk   Phase Initial     Distance 1225 feet     Walk Time 6 minutes     # of Rest Breaks 0  MPH 2.32     METS 2.9     RPE 12     Perceived Dyspnea  0     VO2 Peak 10.15     Symptoms No     Resting HR 96 bpm     Resting BP 134/68     Resting Oxygen Saturation  96 %     Exercise Oxygen Saturation  during 6 min walk 97 %     Max Ex. HR 106 bpm     Max Ex. BP 140/64     2 Minute Post BP 122/68              Oxygen Initial Assessment:   Oxygen Re-Evaluation:   Oxygen Discharge (Final Oxygen Re-Evaluation):   Initial Exercise Prescription:  Initial Exercise Prescription - 12/16/20 1500       Date of Initial Exercise RX and Referring Provider   Date 12/16/20    Referring Provider Rockey Situ, TImothy MD      Treadmill   MPH 2.3    Grade 0.5    Minutes 15    METs 2.92      Recumbant Bike   Level 2    RPM 60    Watts 25    Minutes 15    METs 2.9      NuStep   Level 2    SPM 80    Minutes 15    METs 2.9      REL-XR   Level 1    Speed 50    Minutes 15    METs 2.9      T5 Nustep   Level 1    SPM 80    Minutes 15    METs 2.9      Prescription Details   Frequency (times per week) 3    Duration Progress to 30 minutes of continuous aerobic without  signs/symptoms of physical distress      Intensity   THRR 40-80% of Max Heartrate 117-138    Ratings of Perceived Exertion 11-13    Perceived Dyspnea 0-4      Progression   Progression Continue to progress workloads to maintain intensity without signs/symptoms of physical distress.      Resistance Training   Training Prescription Yes    Weight 4 lb    Reps 10-15             Perform Capillary Blood Glucose checks as needed.  Exercise Prescription Changes:   Exercise Prescription Changes     Row Name 12/22/20 1200 12/24/20 1400 01/05/21 1000 01/20/21 1300       Response to Exercise   Blood Pressure (Admit) 142/74 -- 128/60 122/64    Blood Pressure (Exercise) 138/64 -- 128/58 --    Blood Pressure (Exit) 128/62 -- 118/60 104/64    Heart Rate (Admit) 79 bpm -- 82 bpm 106 bpm    Heart Rate (Exercise) 111 bpm -- 109 bpm 119 bpm    Heart Rate (Exit) 101 bpm -- 88 bpm 96 bpm    Rating of Perceived Exertion (Exercise) 12 -- 12 13    Symptoms -- -- none none    Comments second day exercise -- -- --    Duration Progress to 30 minutes of  aerobic without signs/symptoms of physical distress -- Continue with 30 min of aerobic exercise without signs/symptoms of physical distress. Continue with 30 min of aerobic exercise without signs/symptoms of physical distress.    Intensity THRR unchanged -- THRR unchanged THRR  unchanged         Progression   Progression Continue to progress workloads to maintain intensity without signs/symptoms of physical distress. -- Continue to progress workloads to maintain intensity without signs/symptoms of physical distress. Continue to progress workloads to maintain intensity without signs/symptoms of physical distress.    Average METs 2.93 -- 3.31 3.7         Resistance Training   Training Prescription Yes -- Yes Yes    Weight 4 lb -- 4 lb 4 lb    Reps 10-15 -- 10-15 10-15         Interval Training   Interval Training -- -- No No          Treadmill   MPH 2.5 -- 2.8 2.8    Grade 1 -- 0.5 3.5    Minutes 15 -- 15 15    METs 3.26 -- 3.26 4.49         Recumbant Bike   Level -- -- -- 6    RPM -- -- -- 60    Minutes -- -- -- 15    METs -- -- -- 2.88         REL-XR   Level 3 -- 5 --    Speed 50 -- -- --    Minutes 15 -- 15 --    METs 2.6 -- 4.7 --         T5 Nustep   Level -- -- 1 --    Minutes -- -- 15 --    METs -- -- 2.2 --         Home Exercise Plan   Plans to continue exercise at -- Home (comment)  walking, staff videos Home (comment)  walking, staff videos Home (comment)  walking, staff videos    Frequency -- Add 2 additional days to program exercise sessions. Add 2 additional days to program exercise sessions. Add 2 additional days to program exercise sessions.    Initial Home Exercises Provided -- 12/24/20 12/24/20 12/24/20             Exercise Comments:   Exercise Goals and Review:   Exercise Goals     Row Name 12/16/20 1545             Exercise Goals   Increase Physical Activity Yes       Intervention Provide advice, education, support and counseling about physical activity/exercise needs.;Develop an individualized exercise prescription for aerobic and resistive training based on initial evaluation findings, risk stratification, comorbidities and participant's personal goals.       Expected Outcomes Short Term: Attend rehab on a regular basis to increase amount of physical activity.;Long Term: Add in home exercise to make exercise part of routine and to increase amount of physical activity.;Long Term: Exercising regularly at least 3-5 days a week.       Increase Strength and Stamina Yes       Intervention Provide advice, education, support and counseling about physical activity/exercise needs.;Develop an individualized exercise prescription for aerobic and resistive training based on initial evaluation findings, risk stratification, comorbidities and participant's personal goals.        Expected Outcomes Short Term: Increase workloads from initial exercise prescription for resistance, speed, and METs.;Short Term: Perform resistance training exercises routinely during rehab and add in resistance training at home;Long Term: Improve cardiorespiratory fitness, muscular endurance and strength as measured by increased METs and functional capacity (6MWT)       Able to understand and use rate  of perceived exertion (RPE) scale Yes       Intervention Provide education and explanation on how to use RPE scale       Expected Outcomes Short Term: Able to use RPE daily in rehab to express subjective intensity level;Long Term:  Able to use RPE to guide intensity level when exercising independently       Able to understand and use Dyspnea scale Yes       Intervention Provide education and explanation on how to use Dyspnea scale       Expected Outcomes Short Term: Able to use Dyspnea scale daily in rehab to express subjective sense of shortness of breath during exertion;Long Term: Able to use Dyspnea scale to guide intensity level when exercising independently       Knowledge and understanding of Target Heart Rate Range (THRR) Yes       Intervention Provide education and explanation of THRR including how the numbers were predicted and where they are located for reference       Expected Outcomes Short Term: Able to state/look up THRR;Long Term: Able to use THRR to govern intensity when exercising independently;Short Term: Able to use daily as guideline for intensity in rehab       Able to check pulse independently Yes       Intervention Provide education and demonstration on how to check pulse in carotid and radial arteries.;Review the importance of being able to check your own pulse for safety during independent exercise       Expected Outcomes Short Term: Able to explain why pulse checking is important during independent exercise;Long Term: Able to check pulse independently and accurately        Understanding of Exercise Prescription Yes       Intervention Provide education, explanation, and written materials on patient's individual exercise prescription       Expected Outcomes Short Term: Able to explain program exercise prescription;Long Term: Able to explain home exercise prescription to exercise independently                Exercise Goals Re-Evaluation :  Exercise Goals Re-Evaluation     Row Name 12/17/20 1525 12/22/20 1301 12/24/20 1410 01/05/21 1020 01/08/21 1333     Exercise Goal Re-Evaluation   Exercise Goals Review Increase Physical Activity;Increase Strength and Stamina;Able to understand and use rate of perceived exertion (RPE) scale;Knowledge and understanding of Target Heart Rate Range (THRR);Able to check pulse independently;Understanding of Exercise Prescription Increase Physical Activity;Increase Strength and Stamina Increase Physical Activity;Increase Strength and Stamina;Able to understand and use rate of perceived exertion (RPE) scale;Knowledge and understanding of Target Heart Rate Range (THRR);Understanding of Exercise Prescription;Able to understand and use Dyspnea scale;Able to check pulse independently Increase Physical Activity;Increase Strength and Stamina Increase Physical Activity;Increase Strength and Stamina;Understanding of Exercise Prescription   Comments Reviewed RPE and dyspnea scales, THR and program prescription with pt today.  Pt voiced understanding and was given a copy of goals to take home. Mikki Santee has done well in his first days.  He has increased levels on TM and XR.  Staff will monitor progress. Reviewed home exercise with pt today.  Pt plans to walking with dog and staff videos at home for exercise.  Reviewed THR, pulse, RPE, sign and symptoms, pulse oximetery and when to call 911 or MD.  Also discussed weather considerations and indoor options.  Pt voiced understanding. Mikki Santee is doing well in rehab. He has already increased to level 5 on the XR and  increased to a speed of 2.8 on the treadmill. We will continue to monitor Mikki Santee is doing well in rehab.  He is feeling good and notices a difference in how he is feeling. He is doing his home exercise and walking with the dog. He is doing his balance exercises.   Expected Outcomes Short: Use RPE daily to regulate intensity. Long: Follow program prescription in THR. Short:  attend consistently Long: build overall stamina Short: Start to walk more on off days Long: Improve stamina Short: Increase incline on treadmill Long: Continue to increase overall MET level Short: Continue to add in more walking at home.  Long: Continue improve stamina    Row Name 01/20/21 1334             Exercise Goal Re-Evaluation   Exercise Goals Review Increase Physical Activity;Increase Strength and Stamina       Comments Mikki Santee continues to do well and has increased average MET level.  Staff will continue to monitor progress.       Expected Outcomes Short: contineu to attend consistently Long: build overall stamina                Discharge Exercise Prescription (Final Exercise Prescription Changes):  Exercise Prescription Changes - 01/20/21 1300       Response to Exercise   Blood Pressure (Admit) 122/64    Blood Pressure (Exit) 104/64    Heart Rate (Admit) 106 bpm    Heart Rate (Exercise) 119 bpm    Heart Rate (Exit) 96 bpm    Rating of Perceived Exertion (Exercise) 13    Symptoms none    Duration Continue with 30 min of aerobic exercise without signs/symptoms of physical distress.    Intensity THRR unchanged      Progression   Progression Continue to progress workloads to maintain intensity without signs/symptoms of physical distress.    Average METs 3.7      Resistance Training   Training Prescription Yes    Weight 4 lb    Reps 10-15      Interval Training   Interval Training No      Treadmill   MPH 2.8    Grade 3.5    Minutes 15    METs 4.49      Recumbant Bike   Level 6    RPM 60     Minutes 15    METs 2.88      Home Exercise Plan   Plans to continue exercise at Home (comment)   walking, staff videos   Frequency Add 2 additional days to program exercise sessions.    Initial Home Exercises Provided 12/24/20             Nutrition:  Target Goals: Understanding of nutrition guidelines, daily intake of sodium <1575m, cholesterol <2053m calories 30% from fat and 7% or less from saturated fats, daily to have 5 or more servings of fruits and vegetables.  Education: All About Nutrition: -Group instruction provided by verbal, written material, interactive activities, discussions, models, and posters to present general guidelines for heart healthy nutrition including fat, fiber, MyPlate, the role of sodium in heart healthy nutrition, utilization of the nutrition label, and utilization of this knowledge for meal planning. Follow up email sent as well. Written material given at graduation.   Biometrics:  Pre Biometrics - 12/16/20 1522       Pre Biometrics   Height 5' 10.75" (1.797 m)    Weight 196 lb 3.2 oz (89 kg)  BMI (Calculated) 27.56    Single Leg Stand 6.22 seconds              Nutrition Therapy Plan and Nutrition Goals:  Nutrition Therapy & Goals - 12/31/20 1610       Nutrition Therapy   Diet Heart healthy, low Na, diabetes friendly    Drug/Food Interactions Statins/Certain Fruits    Protein (specify units) 70g    Fiber 30 grams    Whole Grain Foods 3 servings    Saturated Fats 12 max. grams    Fruits and Vegetables 8 servings/day    Sodium 1.5 grams      Personal Nutrition Goals   Nutrition Goal ST: try out different recipes, focu on MyPlate LT: become more confident in meal planning    Comments B: piece of toast with butter to let him take medications (whole grain thin bread) with coffee ( creamer no sugar) L: hotdog and some chips or [place] and cheese sandwich with mayo D: portions have reduced. Vegetable with a meat (grill, baked) - eats  chicken more often. He reports liking his non-starchy vegetables S: lite yogurt with granola. Drinks: water with crystal lite. He liked to have cookies, but he stopped. He would like to focus on meal planning. He was thinking about having some healthy choice frozen meals just to have on hand - discussed quick meals like summer rolls with pre-cut vegetables, protein, and dip - this can easily put together - he was interested in trying that. Discussed heart healthy eating and diabetes friendly eating.      Intervention Plan   Intervention Prescribe, educate and counsel regarding individualized specific dietary modifications aiming towards targeted core components such as weight, hypertension, lipid management, diabetes, heart failure and other comorbidities.;Nutrition handout(s) given to patient.    Expected Outcomes Short Term Goal: Understand basic principles of dietary content, such as calories, fat, sodium, cholesterol and nutrients.;Short Term Goal: A plan has been developed with personal nutrition goals set during dietitian appointment.;Long Term Goal: Adherence to prescribed nutrition plan.             Nutrition Assessments:  MEDIFICTS Score Key: ?70 Need to make dietary changes  40-70 Heart Healthy Diet ? 40 Therapeutic Level Cholesterol Diet  Flowsheet Row Cardiac Rehab from 12/16/2020 in [facility] Cardiac and Pulmonary Rehab  Picture Your Plate Total Score on Admission 54      Picture Your Plate Scores: <63 Unhealthy dietary pattern with much room for improvement. 41-50 Dietary pattern unlikely to meet recommendations for good health and room for improvement. 51-60 More healthful dietary pattern, with some room for improvement.  >60 Healthy dietary pattern, although there may be some specific behaviors that could be improved.    Nutrition Goals Re-Evaluation:  Nutrition Goals Re-Evaluation     [place] Name 01/08/21 1338             Goals   Nutrition Goal ST: try out different  recipes, focu on MyPlate LT: become more confident in meal planning       Comment Mikki Santee had a good meeting last week.  He is reading food labels and staying away from transfat and saturated fats.  They always make sure that there are vegetables on the table.       Expected Outcome Short: Try new recipes  Long; Continue to focus on healthy eating.                Nutrition Goals Discharge (Final Nutrition Goals Re-Evaluation):  Nutrition  Goals Re-Evaluation - 01/08/21 1338       Goals   Nutrition Goal ST: try out different recipes, focu on MyPlate LT: become more confident in meal planning    Comment Mikki Santee had a good meeting last week.  He is reading food labels and staying away from transfat and saturated fats.  They always make sure that there are vegetables on the table.    Expected Outcome Short: Try new recipes  Long; Continue to focus on healthy eating.             Psychosocial: Target Goals: Acknowledge presence or absence of significant depression and/or stress, maximize coping skills, provide positive support system. Participant is able to verbalize types and ability to use techniques and skills needed for reducing stress and depression.   Education: Stress, Anxiety, and Depression - Group verbal and visual presentation to define topics covered.  Reviews how body is impacted by stress, anxiety, and depression.  Also discusses healthy ways to reduce stress and to treat/manage anxiety and depression.  Written material given at graduation.   Education: Sleep Hygiene -Provides group verbal and written instruction about how sleep can affect your health.  Define sleep hygiene, discuss sleep cycles and impact of sleep habits. Review good sleep hygiene tips.    Initial Review & Psychosocial Screening:  Initial Psych Review & Screening - 12/08/20 1542       Initial Review   Current issues with Current Stress Concerns      Family Dynamics   Good Support System? Yes   wife,  children, grandchildren     Barriers   Psychosocial barriers to participate in program There are no identifiable barriers or psychosocial needs.;The patient should benefit from training in stress management and relaxation.      Screening Interventions   Interventions Encouraged to exercise;Provide feedback about the scores to participant;To provide support and resources with identified psychosocial needs    Expected Outcomes Short Term goal: Utilizing psychosocial counselor, staff and physician to assist with identification of specific Stressors or current issues interfering with healing process. Setting desired goal for each stressor or current issue identified.;Long Term Goal: Stressors or current issues are controlled or eliminated.;Short Term goal: Identification and review with participant of any Quality of Life or Depression concerns found by scoring the questionnaire.;Long Term goal: The participant improves quality of Life and PHQ9 Scores as seen by post scores and/or verbalization of changes             Quality of Life Scores:   Quality of Life - 12/16/20 1527       Quality of Life   Select Quality of Life      Quality of Life Scores   Health/Function Pre 23.2 %    Socioeconomic Pre 25.13 %    Psych/Spiritual Pre 23.14 %    Family Pre 27.6 %    GLOBAL Pre 24.26 %            Scores of 19 and below usually indicate a poorer quality of life in these areas.  A difference of  2-3 points is a clinically meaningful difference.  A difference of 2-3 points in the total score of the Quality of Life Index has been associated with significant improvement in overall quality of life, self-image, physical symptoms, and general health in studies assessing change in quality of life.  PHQ-9: Recent Review Flowsheet Data     Depression screen [facility] 2/9 12/16/2020 11/21/2020 08/22/2020   Decreased Interest 0 0  0   Down, Depressed, Hopeless 0 0 0   PHQ - 2 Score 0 0 0   Altered sleeping 0 0 -    Tired, decreased energy 0 1 -   Change in appetite 0 0 -   Feeling bad or failure about yourself  0 0 -   Trouble concentrating 0 0 -   Moving slowly or fidgety/restless 0 0 -   Suicidal thoughts 0 0 -   PHQ-9 Score 0 1 -   Difficult doing work/chores Not difficult at all Not difficult at all -      Interpretation of Total Score  Total Score Depression Severity:  1-4 = Minimal depression, 5-9 = Mild depression, 10-14 = Moderate depression, 15-19 = Moderately severe depression, 20-27 = Severe depression   Psychosocial Evaluation and Intervention:  Psychosocial Evaluation - 12/08/20 1554       Psychosocial Evaluation & Interventions   Interventions Encouraged to exercise with the program and follow exercise prescription    Comments 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 He states he is doing well post op and starting to get back to his usual routine. He is retired from [employer] and likes to stay active.  He and his wife have 6 daughters and 2 sons that live out of town but are very supportive. He made some big lifestyle changes when he had his stents years ago. He and his wife like to kayak and be active so he is ready to get back to that in a safe way. He did report some anxiety/stress from the surgery where he felt the need to make videos for his children in case he didn't make it. He states that he didn't want them to know about it but he is starting to feel more confident now that its been weeks post op. His hope is this program will help build on his foundation of a heart healthy lifestyle    Expected Outcomes Short: attend cardiac rehab for education and exericse. Long: develop and maintain positive self care habits.    Continue Psychosocial Services  Follow up required by staff             Psychosocial Re-Evaluation:  Psychosocial Re-Evaluation     [place] Name 01/08/21 1335             Psychosocial Re-Evaluation   Current issues with Current  Stress Concerns       Comments Mikki Santee is doing well in rehab. He has good days and great day!  He always tries to focus on the positive things. His anxiiety is getting better as he gets more information and being able to do more and more each day. He sleeps very well most nights.       Expected Outcomes Short: Continue to exercise for mental boost Long: Continue to focus on positive       Interventions Encouraged to attend Cardiac Rehabilitation for the exercise       Continue Psychosocial Services  Follow up required by staff                Psychosocial Discharge (Final Psychosocial Re-Evaluation):  Psychosocial Re-Evaluation - 01/08/21 1335       Psychosocial Re-Evaluation   Current issues with Current Stress Concerns    Comments Mikki Santee is doing well in rehab. He has good days and great day!  He always tries to focus on the positive things. His anxiiety is getting better as  he gets more information and being able to do more and more each day. He sleeps very well most nights.    Expected Outcomes Short: Continue to exercise for mental boost Long: Continue to focus on positive    Interventions Encouraged to attend Cardiac Rehabilitation for the exercise    Continue Psychosocial Services  Follow up required by staff             Vocational Rehabilitation: Provide vocational rehab assistance to qualifying candidates.   Vocational Rehab Evaluation & Intervention:  Vocational Rehab - 12/08/20 1542       Initial Vocational Rehab Evaluation & Intervention   Assessment shows need for Vocational Rehabilitation No             Education: Education Goals: Education classes will be provided on a variety of topics geared toward better understanding of heart health and risk factor modification. Participant will state understanding/return demonstration of topics presented as noted by education test scores.  Learning Barriers/Preferences:  Learning Barriers/Preferences - 12/08/20 1541        Learning Barriers/Preferences   Learning Barriers None    Learning Preferences None             General Cardiac Education Topics:  AED/CPR: - Group verbal and written instruction with the use of models to demonstrate the basic use of the AED with the basic ABC's of resuscitation.   Anatomy and Cardiac Procedures: - Group verbal and visual presentation and models provide information about basic cardiac anatomy and function. Reviews the testing methods done to diagnose heart disease and the outcomes of the test results. Describes the treatment choices: Medical Management, Angioplasty, or Coronary Bypass Surgery for treating various heart conditions including Myocardial Infarction, Angina, Valve Disease, and Cardiac Arrhythmias.  Written material given at graduation. Flowsheet Row Cardiac Rehab from 01/14/2021 in [facility] Cardiac and Pulmonary Rehab  Date 01/14/21  Educator [facility]  Instruction Review Code 1- Verbalizes Understanding       Medication Safety: - Group verbal and visual instruction to review commonly prescribed medications for heart and lung disease. Reviews the medication, class of the drug, and side effects. Includes the steps to properly store meds and maintain the prescription regimen.  Written material given at graduation.   Intimacy: - Group verbal instruction through game format to discuss how heart and lung disease can affect sexual intimacy. Written material given at graduation.. Flowsheet Row Cardiac Rehab from [facility]/14/2021 in [facility] Cardiac and Pulmonary Rehab  Date 01/07/21  Educator [place]  Instruction Review Code 1- Verbalizes Understanding       Know Your Numbers and Heart Failure: - Group verbal and visual instruction to discuss disease risk factors for cardiac and pulmonary disease and treatment options.  Reviews associated critical values for Overweight/Obesity, Hypertension, Cholesterol, and Diabetes.  Discusses basics of heart failure: signs/symptoms and  treatments.  Introduces Heart Failure Zone chart for action plan for heart failure.  Written material given at graduation.   Infection Prevention: - Provides verbal and written material to individual with discussion of infection control including proper hand washing and proper equipment cleaning during exercise session. Flowsheet Row Cardiac Rehab from 01/14/2021 in [facility] Cardiac and Pulmonary Rehab  Education need identified 12/16/20  Date 12/16/20  Educator [place]  Instruction Review Code 1- Verbalizes Understanding       Falls Prevention: - Provides verbal and written material to individual with discussion of falls prevention and safety. Flowsheet Row Cardiac Rehab from 01/14/2021 in [facility] Cardiac and Pulmonary Rehab  Education need identified 12/16/20  Date 12/16/20  Educator [place]  Instruction Review Code 1- Verbalizes Understanding       Other: -Provides group and verbal instruction on various topics (see comments)   Knowledge Questionnaire Score:  Knowledge Questionnaire Score - 12/16/20 1527       Knowledge Questionnaire Score   Pre Score 24/26: Angina, Exercise             Core Components/Risk Factors/Patient Goals at Admission:  Personal Goals and Risk Factors at Admission - 12/16/20 1546       Core Components/Risk Factors/Patient Goals on Admission    Weight Management Yes;Weight Loss    Intervention Weight Management: Develop a combined nutrition and exercise program designed to reach desired caloric intake, while maintaining appropriate intake of nutrient and fiber, sodium and fats, and appropriate energy expenditure required for the weight goal.;Weight Management: Provide education and appropriate resources to help participant work on and attain dietary goals.;Weight Management/Obesity: Establish reasonable short term and long term weight goals.    Admit Weight 196 lb (88.9 kg)    Goal Weight: Short Term 191 lb (86.6 kg)    Goal Weight: Long Term 186 lb (84.4 kg)     Expected Outcomes Short Term: Continue to assess and modify interventions until short term weight is achieved;Long Term: Adherence to nutrition and physical activity/exercise program aimed toward attainment of established weight goal;Weight Loss: Understanding of general recommendations for a balanced deficit meal plan, which promotes 1-2 lb weight loss per week and includes a negative energy balance of [id number] kcal/d;Understanding recommendations for meals to include 15-35% energy as protein, 25-35% energy from fat, 35-60% energy from carbohydrates, less than 269m of dietary cholesterol, 20-35 gm of total fiber daily;Understanding of distribution of calorie intake throughout the day with the consumption of 4-5 meals/snacks    Diabetes Yes    Intervention Provide education about signs/symptoms and action to take for hypo/hyperglycemia.;Provide education about proper nutrition, including hydration, and aerobic/resistive exercise prescription along with prescribed medications to achieve blood glucose in normal ranges: Fasting glucose 65-99 mg/dL    Hypertension Yes    Intervention Provide education on lifestyle modifcations including regular physical activity/exercise, weight management, moderate sodium restriction and increased consumption of fresh fruit, vegetables, and low fat dairy, alcohol moderation, and smoking cessation.;Monitor prescription use compliance.    Expected Outcomes Short Term: Continued assessment and intervention until BP is < 140/96mHG in hypertensive participants. < 130/8029mG in hypertensive participants with diabetes, heart failure or chronic kidney disease.;Long Term: Maintenance of blood pressure at goal levels.    Lipids Yes    Intervention Provide education and support for participant on nutrition & aerobic/resistive exercise along with prescribed medications to achieve LDL <21m22mDL >40mg56m          Education:Diabetes - Individual verbal and written  instruction to review signs/symptoms of diabetes, desired ranges of glucose level fasting, after meals and with exercise. Acknowledge that pre and post exercise glucose checks will be done for 3 sessions at entry of program. [place] [facility]  Education need identified 12/16/20  Date 12/16/20  Educator [place]  IHastingstruction Review Code 1- Verbalizes Understanding       Core Components/Risk Factors/Patient Goals Review:   Goals and Risk Factor Review     Row Name 01/08/21 1340             Core Components/Risk Factors/Patient Goals Review   Personal Goals  Review Weight Management/Obesity;Hypertension;Diabetes;Lipids       Review Mikki Santee is holding steady on his weight and keeping his weight under 200 lb.  He has kept it down for the last 6-7 years.  Blood pressures are doing well in class. He doesn't check them at home as he feels they are doing well.  His blood sugars have been good and he does check them at home routinely.       Expected Outcomes Short: Continue to monitor blood sugars Long: Continue to montior risk factors.                Core Components/Risk Factors/Patient Goals at Discharge (Final Review):   Goals and Risk Factor Review - 01/08/21 1340       Core Components/Risk Factors/Patient Goals Review   Personal Goals Review Weight Management/Obesity;Hypertension;Diabetes;Lipids    Review Mikki Santee is holding steady on his weight and keeping his weight under 200 lb.  He has kept it down for the last 6-7 years.  Blood pressures are doing well in class. He doesn't check them at home as he feels they are doing well.  His blood sugars have been good and he does check them at home routinely.    Expected Outcomes Short: Continue to monitor blood sugars Long: Continue to montior risk factors.             ITP Comments:  ITP Comments     Row Name 12/08/20 1539 12/16/20 1513 12/17/20 1524 12/24/20 0744 12/31/20 1609   ITP  Comments Initial telephone orientation completed. Diagnosis can be found in [facility] 6/17. EP orientation scheduled for Tuesday 8/2 at 10am. Completed 6MWT and gym orientation. Initial ITP created and sent for review to Dr. Emily Filbert, Medical Director. First full day of exercise!  Patient was oriented to gym and equipment including functions, settings, policies, and procedures.  Patient's individual exercise prescription and treatment plan were reviewed.  All starting workloads were established based on the results of the 6 minute walk test done at initial orientation visit.  The plan for exercise progression was also introduced and progression will be customized based on patient's performance and goals. 30 Day review completed. Medical Director ITP review done, changes made as directed, and signed approval by Medical Director.    New to program Completed initial RD consultation    [place] Name 01/21/21 0644           ITP Comments 30 Day review completed. Medical Director ITP review done, changes made as directed, and signed approval by Medical Director.                Comments:

## 2021-01-21 NOTE — Progress Notes (Signed)
Daily Session Note  Patient Details  Name: Ronald Roth MRN: [id number] Date of Birth: [birth year] Referring Provider:   Flowsheet Row Cardiac Rehab from 12/16/2020 in [facility] Cardiac and Pulmonary Rehab  Referring Provider [name] MD       Encounter Date: 01/21/2021  Check In:  Session Check In - 01/21/21 1423       Check-In   Supervising physician immediately available to respond to emergencies See telemetry face sheet for immediately available ER MD    Location [facility]-Cardiac & Pulmonary Rehab    Staff Present Nyoka Cowden, RN, BSN, [place], MA, RCEP, CCRP, [place], MS, ASCM CEP, Exercise Physiologist;Joseph [place], [place]    Virtual Visit No    Medication changes reported     No    Fall or balance concerns reported    No    Tobacco Cessation No Change    Warm-up and Cool-down Performed on first and last piece of equipment    Resistance Training Performed No    VAD Patient? No    PAD/SET Patient? No      Pain Assessment   Currently in Pain? No/denies                Social History   Tobacco Use  Smoking Status Former   Packs/day: 1.00   Years: 30.00   Pack years: 30.00   Types: Cigarettes   Quit date: 04/18/1991   Years since quitting: 29.7  Smokeless Tobacco Never    Goals Met:  Independence with exercise equipment Exercise tolerated well No report of concerns or symptoms today  Goals Unmet:  Not Applicable  Comments: Pt able to follow exercise prescription today without complaint.  Will continue to monitor for progression.    Dr. Emily Filbert is Medical Director for [place].  Dr. Ottie Glazier is Medical Director for [facility] Pulmonary Rehabilitation.

## 2021-01-22 ENCOUNTER — Encounter: Payer: Managed Care, Other (non HMO) | Admitting: *Deleted

## 2021-01-22 DIAGNOSIS — Z951 Presence of aortocoronary bypass graft: Secondary | ICD-10-CM | POA: Diagnosis not present

## 2021-01-22 NOTE — Progress Notes (Signed)
Daily Session Note  Patient Details  Name: Ronald Roth MRN: [id number] Date of Birth: [birth year] Referring Provider:   Flowsheet Row Cardiac Rehab from 12/16/2020 in [facility] Cardiac and Pulmonary Rehab  Referring Provider [name] MD       Encounter Date: 01/22/2021  Check In:  Session Check In - 01/22/21 1337       Check-In   Supervising physician immediately available to respond to emergencies See telemetry face sheet for immediately available ER MD    Location [facility]-Cardiac & Pulmonary Rehab    Staff Present Renita Papa, RN BSN;Joseph [place], RCP,RRT,BSRT;Jessica [place], [place], RCEP, CCRP, CCET    Virtual Visit No    Medication changes reported     No    Fall or balance concerns reported    No    Warm-up and Cool-down Performed on first and last piece of equipment    Resistance Training Performed Yes    VAD Patient? No    PAD/SET Patient? No      Pain Assessment   Currently in Pain? No/denies                Social History   Tobacco Use  Smoking Status Former   Packs/day: 1.00   Years: 30.00   Pack years: 30.00   Types: Cigarettes   Quit date: 04/18/1991   Years since quitting: 29.7  Smokeless Tobacco Never    Goals Met:  Independence with exercise equipment Exercise tolerated well No report of concerns or symptoms today Strength training completed today  Goals Unmet:  Not Applicable  Comments: Pt able to follow exercise prescription today without complaint.  Will continue to monitor for progression.    Dr. Emily Filbert is Medical Director for [place].  Dr. Ottie Glazier is Medical Director for [facility] Pulmonary Rehabilitation.

## 2021-01-26 ENCOUNTER — Other Ambulatory Visit: Payer: Self-pay

## 2021-01-26 DIAGNOSIS — Z951 Presence of aortocoronary bypass graft: Secondary | ICD-10-CM | POA: Diagnosis not present

## 2021-01-26 NOTE — Progress Notes (Signed)
Daily Session Note  Patient Details  Name: Ronald Roth MRN: [id number] Date of Birth: 1951/01/11 Referring Provider:   Flowsheet Row Cardiac Rehab from 12/16/2020 in [facility] Cardiac and Pulmonary Rehab  Referring Provider [name] MD       Encounter Date: 01/26/2021  Check In:  Session Check In - 01/26/21 1414       Check-In   Supervising physician immediately available to respond to emergencies See telemetry face sheet for immediately available ER MD    Location [facility]-Cardiac & Pulmonary Rehab    Staff Present Birdie Sons, MPA, Nino Glow, MS, ASCM CEP, Exercise Physiologist;Joseph Tessie [place], [place]    Virtual Visit No    Medication changes reported     No    Fall or balance concerns reported    No    Tobacco Cessation No Change    Warm-up and Cool-down Performed on first and last piece of equipment    Resistance Training Performed Yes    VAD Patient? No    PAD/SET Patient? No      Pain Assessment   Currently in Pain? No/denies                Social History   Tobacco Use  Smoking Status Former   Packs/day: 1.00   Years: 30.00   Pack years: 30.00   Types: Cigarettes   Quit date: 04/18/1991   Years since quitting: 29.7  Smokeless Tobacco Never    Goals Met:  Independence with exercise equipment Exercise tolerated well No report of concerns or symptoms today Strength training completed today  Goals Unmet:  Not Applicable  Comments: Pt able to follow exercise prescription today without complaint.  Will continue to monitor for progression.    Dr. Emily Filbert is Medical Director for [place].  Dr. Ottie Glazier is Medical Director for [facility] Pulmonary Rehabilitation.

## 2021-01-28 ENCOUNTER — Other Ambulatory Visit: Payer: Self-pay

## 2021-01-28 DIAGNOSIS — Z951 Presence of aortocoronary bypass graft: Secondary | ICD-10-CM

## 2021-01-28 NOTE — Progress Notes (Signed)
Daily Session Note  Patient Details  Name: Ronald Roth MRN: [id number] Date of Birth: [birth year] Referring Provider:   Flowsheet Row Cardiac Rehab from 12/16/2020 in [facility] Cardiac and Pulmonary Rehab  Referring Provider [name] MD       Encounter Date: 01/28/2021  Check In:  Session Check In - 01/28/21 1335       Check-In   Supervising physician immediately available to respond to emergencies See telemetry face sheet for immediately available ER MD    Location [facility]-Cardiac & Pulmonary Rehab    Staff Present Birdie Sons, MPA, Nino Glow, MS, ASCM CEP, Exercise Physiologist;Joseph Tessie Fass, [place]    Virtual Visit No    Medication changes reported     No    Fall or balance concerns reported    No    Tobacco Cessation No Change    Warm-up and Cool-down Performed on first and last piece of equipment    Resistance Training Performed Yes    VAD Patient? No    PAD/SET Patient? No      Pain Assessment   Currently in Pain? No/denies                Social History   Tobacco Use  Smoking Status Former   Packs/day: 1.00   Years: 30.00   Pack years: 30.00   Types: Cigarettes   Quit date: 04/18/1991   Years since quitting: 29.8  Smokeless Tobacco Never    Goals Met:  Independence with exercise equipment Exercise tolerated well No report of concerns or symptoms today Strength training completed today  Goals Unmet:  Not Applicable  Comments: Pt able to follow exercise prescription today without complaint.  Will continue to monitor for progression.    Dr. Emily Filbert is Medical Director for [place].  Dr. Ottie Glazier is Medical Director for [facility] Pulmonary Rehabilitation.

## 2021-01-29 ENCOUNTER — Encounter: Payer: Managed Care, Other (non HMO) | Admitting: *Deleted

## 2021-01-29 ENCOUNTER — Other Ambulatory Visit: Payer: Self-pay

## 2021-01-29 DIAGNOSIS — Z951 Presence of aortocoronary bypass graft: Secondary | ICD-10-CM | POA: Diagnosis not present

## 2021-01-29 NOTE — Progress Notes (Signed)
Daily Session Note  Patient Details  Name: Ronald Roth MRN: [id number] Date of Birth: [birth year] Referring Provider:   Flowsheet Row Cardiac Rehab from 12/16/2020 in [facility] Cardiac and Pulmonary Rehab  Referring Provider [name] MD       Encounter Date: 01/29/2021  Check In:  Session Check In - 01/29/21 1347       Check-In   Supervising physician immediately available to respond to emergencies See telemetry face sheet for immediately available ER MD    Location [facility]-Cardiac & Pulmonary Rehab    Staff Present Renita Papa, RN BSN;Joseph [place], RCP,RRT,BSRT;Jessica [place], [place], RCEP, CCRP, CCET    Virtual Visit No    Medication changes reported     No    Fall or balance concerns reported    No    Warm-up and Cool-down Performed on first and last piece of equipment    Resistance Training Performed Yes    VAD Patient? No    PAD/SET Patient? No      Pain Assessment   Currently in Pain? No/denies                Social History   Tobacco Use  Smoking Status Former   Packs/day: 1.00   Years: 30.00   Pack years: 30.00   Types: Cigarettes   Quit date: 04/18/1991   Years since quitting: 29.8  Smokeless Tobacco Never    Goals Met:  Independence with exercise equipment Exercise tolerated well No report of concerns or symptoms today Strength training completed today  Goals Unmet:  Not Applicable  Comments: Pt able to follow exercise prescription today without complaint.  Will continue to monitor for progression.    Dr. Emily Filbert is Medical Director for [place].  Dr. Ottie Glazier is Medical Director for [facility] Pulmonary Rehabilitation.

## 2021-02-02 ENCOUNTER — Other Ambulatory Visit: Payer: Self-pay

## 2021-02-02 ENCOUNTER — Encounter: Payer: Managed Care, Other (non HMO) | Admitting: *Deleted

## 2021-02-02 DIAGNOSIS — Z951 Presence of aortocoronary bypass graft: Secondary | ICD-10-CM

## 2021-02-02 NOTE — Progress Notes (Signed)
Daily Session Note  Patient Details  Name: Ronald Roth MRN: [id number] Date of Birth: 13-Jan-1951 Referring Provider:   Flowsheet Row Cardiac Rehab from 12/16/2020 in [facility] Cardiac and Pulmonary Rehab  Referring Provider [name] MD       Encounter Date: 02/02/2021  Check In:  Session Check In - 02/02/21 1338       Check-In   Supervising physician immediately available to respond to emergencies See telemetry face sheet for immediately available ER MD    Location [facility]-Cardiac & Pulmonary Rehab    Staff Present Renita Papa, RN BSN;Joseph [place], RCP,RRT,BSRT;Kara [place], [place], ASCM CEP, Exercise Physiologist    Virtual Visit No    Medication changes reported     No    Fall or balance concerns reported    No    Warm-up and Cool-down Performed on first and last piece of equipment    Resistance Training Performed Yes    VAD Patient? No    PAD/SET Patient? No      Pain Assessment   Currently in Pain? No/denies                Social History   Tobacco Use  Smoking Status Former   Packs/day: 1.00   Years: 30.00   Pack years: 30.00   Types: Cigarettes   Quit date: 04/18/1991   Years since quitting: 29.8  Smokeless Tobacco Never    Goals Met:  Independence with exercise equipment Exercise tolerated well No report of concerns or symptoms today Strength training completed today  Goals Unmet:  Not Applicable  Comments: Pt able to follow exercise prescription today without complaint.  Will continue to monitor for progression.    Dr. Emily Filbert is Medical Director for [place].  Dr. Ottie Glazier is Medical Director for [facility] Pulmonary Rehabilitation.

## 2021-02-04 ENCOUNTER — Other Ambulatory Visit: Payer: Self-pay

## 2021-02-04 DIAGNOSIS — Z951 Presence of aortocoronary bypass graft: Secondary | ICD-10-CM

## 2021-02-04 NOTE — Progress Notes (Signed)
Daily Session Note  Patient Details  Name: Ronald Roth MRN: [id number] Date of Birth: 1950-07-23 Referring Provider:   Flowsheet Row Cardiac Rehab from 12/16/2020 in [facility] Cardiac and Pulmonary Rehab  Referring Provider [name] MD       Encounter Date: 02/04/2021  Check In:  Session Check In - 02/04/21 1334       Check-In   Supervising physician immediately available to respond to emergencies See telemetry face sheet for immediately available ER MD    Location [facility]-Cardiac & Pulmonary Rehab    Staff Present Birdie Sons, MPA, Nino Glow, MS, ASCM CEP, Exercise Physiologist;Joseph Tessie Fass, [place]    Virtual Visit No    Medication changes reported     No    Fall or balance concerns reported    No    Tobacco Cessation No Change    Warm-up and Cool-down Performed on first and last piece of equipment    Resistance Training Performed Yes    VAD Patient? No    PAD/SET Patient? No      Pain Assessment   Currently in Pain? No/denies                Social History   Tobacco Use  Smoking Status Former   Packs/day: 1.00   Years: 30.00   Pack years: 30.00   Types: Cigarettes   Quit date: 04/18/1991   Years since quitting: 29.8  Smokeless Tobacco Never    Goals Met:  Independence with exercise equipment Exercise tolerated well No report of concerns or symptoms today Strength training completed today  Goals Unmet:  Not Applicable  Comments: Pt able to follow exercise prescription today without complaint.  Will continue to monitor for progression.    Dr. Emily Filbert is Medical Director for [place].  Dr. Ottie Glazier is Medical Director for [facility] Pulmonary Rehabilitation.

## 2021-02-05 ENCOUNTER — Other Ambulatory Visit: Payer: Self-pay

## 2021-02-05 ENCOUNTER — Encounter: Payer: Managed Care, Other (non HMO) | Admitting: *Deleted

## 2021-02-05 DIAGNOSIS — Z951 Presence of aortocoronary bypass graft: Secondary | ICD-10-CM

## 2021-02-05 NOTE — Progress Notes (Signed)
Daily Session Note  Patient Details  Name: Ronald Roth MRN: [id number] Date of Birth: 21-Jul-1950 Referring Provider:   Flowsheet Row Cardiac Rehab from 12/16/2020 in [facility] Cardiac and Pulmonary Rehab  Referring Provider [name] MD       Encounter Date: 02/05/2021  Check In:  Session Check In - 02/05/21 1332       Check-In   Supervising physician immediately available to respond to emergencies See telemetry face sheet for immediately available ER MD    Location [facility]-Cardiac & Pulmonary Rehab    Staff Present Renita Papa, RN BSN;Melissa [place], RDN, LDN;Jessica Luan Pulling, MA, RCEP, CCRP, CCET    Virtual Visit No    Medication changes reported     No    Fall or balance concerns reported    No    Warm-up and Cool-down Performed on first and last piece of equipment    Resistance Training Performed Yes    VAD Patient? No      Pain Assessment   Currently in Pain? No/denies                Social History   Tobacco Use  Smoking Status Former   Packs/day: 1.00   Years: 30.00   Pack years: 30.00   Types: Cigarettes   Quit date: 04/18/1991   Years since quitting: 29.8  Smokeless Tobacco Never    Goals Met:  Independence with exercise equipment Exercise tolerated well No report of concerns or symptoms today Strength training completed today  Goals Unmet:  Not Applicable  Comments: Pt able to follow exercise prescription today without complaint.  Will continue to monitor for progression.    Dr. Emily Filbert is Medical Director for [place].  Dr. Ottie Glazier is Medical Director for [facility] Pulmonary Rehabilitation.

## 2021-02-16 ENCOUNTER — Encounter: Payer: Managed Care, Other (non HMO) | Attending: Cardiovascular Disease

## 2021-02-16 ENCOUNTER — Other Ambulatory Visit: Payer: Self-pay

## 2021-02-16 DIAGNOSIS — Z951 Presence of aortocoronary bypass graft: Secondary | ICD-10-CM | POA: Diagnosis present

## 2021-02-16 DIAGNOSIS — Z48812 Encounter for surgical aftercare following surgery on the circulatory system: Secondary | ICD-10-CM | POA: Insufficient documentation

## 2021-02-16 NOTE — Progress Notes (Signed)
Daily Session Note  Patient Details  Name: Ronald Roth MRN: [id number] Date of Birth: 06/20/1950 Referring Provider:   Flowsheet Row Cardiac Rehab from 12/16/2020 in [facility] Cardiac and Pulmonary Rehab  Referring Provider [name] MD       Encounter Date: 02/16/2021  Check In:  Session Check In - 02/16/21 1338       Check-In   Supervising physician immediately available to respond to emergencies See telemetry face sheet for immediately available ER MD    Location [facility]-Cardiac & Pulmonary Rehab    Staff Present Birdie Sons, MPA, Nino Glow, MS, ASCM CEP, Exercise Physiologist;Joseph Tessie Fass, [place]    Virtual Visit No    Medication changes reported     No    Fall or balance concerns reported    No    Tobacco Cessation No Change    Warm-up and Cool-down Performed on first and last piece of equipment    Resistance Training Performed Yes    VAD Patient? No    PAD/SET Patient? No      Pain Assessment   Currently in Pain? No/denies                Social History   Tobacco Use  Smoking Status Former   Packs/day: 1.00   Years: 30.00   Pack years: 30.00   Types: Cigarettes   Quit date: 04/18/1991   Years since quitting: 29.8  Smokeless Tobacco Never    Goals Met:  Independence with exercise equipment Exercise tolerated well No report of concerns or symptoms today Strength training completed today  Goals Unmet:  Not Applicable  Comments: Pt able to follow exercise prescription today without complaint.  Will continue to monitor for progression.    Dr. Emily Filbert is Medical Director for [place].  Dr. Ottie Glazier is Medical Director for [facility] Pulmonary Rehabilitation.

## 2021-02-18 ENCOUNTER — Encounter: Payer: Self-pay | Admitting: *Deleted

## 2021-02-18 DIAGNOSIS — Z951 Presence of aortocoronary bypass graft: Secondary | ICD-10-CM

## 2021-02-18 NOTE — Progress Notes (Signed)
Cardiac Individual Treatment Plan  Patient Details  Name: Wayman Hoard MRN: [id number] Date of Birth: 08-22-1950 Referring Provider:   Flowsheet Row Cardiac Rehab from 12/16/2020 in [facility] Cardiac and Pulmonary Rehab  Referring Provider [name] MD       Initial Encounter Date:  Flowsheet Row Cardiac Rehab from 12/16/2020 in [facility] Cardiac and Pulmonary Rehab  Date 12/16/20       Visit Diagnosis: S/P CABG x 4  Patient's Home Medications on Admission:  Current Outpatient Medications:    acetaminophen (TYLENOL) 500 MG tablet, Take 2 tablets (1,000 mg total) by mouth every 6 (six) hours as needed., Disp: 30 tablet, Rfl: 0   aspirin EC 81 MG tablet, Take 81 mg by mouth in the morning. Swallow whole., Disp: , Rfl:    atorvastatin (LIPITOR) 80 MG tablet, Take 1 tablet (80 mg total) by mouth in the morning. Take 80 mg by mouth in the morning., Disp: 90 tablet, Rfl: 3   diphenhydrAMINE (BENADRYL) 25 MG tablet, Take 75 mg by mouth at bedtime., Disp: , Rfl:    ezetimibe (ZETIA) 10 MG tablet, Take 1 tablet (10 mg total) by mouth in the morning., Disp: 90 tablet, Rfl: 3   fenofibrate (TRICOR) 145 MG tablet, Take 145 mg by mouth in the morning., Disp: , Rfl:    JARDIANCE 25 MG TABS tablet, Take 1 tablet (25 mg total) by mouth daily before breakfast., Disp: 90 tablet, Rfl: 3   metoprolol succinate (TOPROL-XL) 50 MG 24 hr tablet, Take 1 tablet (50 mg total) by mouth daily. Take with or immediately following a meal., Disp: 90 tablet, Rfl: 3   nitroGLYCERIN (NITROSTAT) 0.4 MG SL tablet, Place 1 tablet (0.4 mg total) under the tongue every 5 (five) minutes x 3 doses as needed for chest pain., Disp: 25 tablet, Rfl: 3   omeprazole (PRILOSEC) 20 MG capsule, Take 20 mg by mouth in the morning., Disp: , Rfl:    OZEMPIC, 0.25 OR 0.5 MG/DOSE, 2 MG/1.5ML SOPN, INJECT SUBCUTANEOUSLY 0.5MG ONCE WEEKLY, Disp: 4.5 mL, Rfl: 0   sacubitril-valsartan (ENTRESTO) 24-26 MG, Take 1 tablet by mouth 2 (two) times daily.,  Disp: 60 tablet, Rfl: 11   Vitamin D, Ergocalciferol, (DRISDOL) 1.25 MG (50000 UNIT) CAPS capsule, Take 50,000 Units by mouth 2 (two) times a week. Sundays & Thursdays, Disp: , Rfl:   Past Medical History: Past Medical History:  Diagnosis Date   Anxiety    Coronary artery disease    Diabetes mellitus without complication (HCC)    GERD (gastroesophageal reflux disease)    Heart attack ([place]) 2012   Hypertension    Sciatica     Tobacco Use: Social History   Tobacco Use  Smoking Status Former   Packs/day: 1.00   Years: 30.00   Pack years: 30.00   Types: Cigarettes   Quit date: 04/18/1991   Years since quitting: 29.8  Smokeless Tobacco Never    Labs: Recent Review Flowsheet Data     Labs for ITP Cardiac and Pulmonary Rehab Latest Ref Rng & Units 10/31/2020 10/31/2020 10/31/2020 10/31/2020 11/19/2020   Cholestrol 100 - 199 mg/dL - - - - 135   LDLCALC 0 - 99 mg/dL - - - - 79   HDL >39 mg/dL - - - - 30(L)   Trlycerides 0 - 149 mg/dL - - - - 149   Hemoglobin A1c 4.8 - 5.6 % - - - - 7.3(H)   PHART 7.350 - 7.450 - 7.352 7.343(L) 7.303(L) -   PCO2ART  32.0 - 48.0 mmHg - 43.0 44.9 43.8 -   HCO3 20.0 - 28.0 mmol/L - 24.4 24.4 21.6 -   TCO2 22 - 32 mmol/L 23 26 26 23  -   ACIDBASEDEF 0.0 - 2.0 mmol/L - 2.0 2.0 5.0(H) -   O2SAT % - 95.0 98.0 97.0 -        Exercise Target Goals: Exercise Program Goal: Individual exercise prescription set using results from initial 6 min walk test and THRR while considering  patient's activity barriers and safety.   Exercise Prescription Goal: Initial exercise prescription builds to 30-45 minutes a day of aerobic activity, 2-3 days per week.  Home exercise guidelines will be given to patient during program as part of exercise prescription that the participant will acknowledge.   Education: Aerobic Exercise: - Group verbal and visual presentation on the components of exercise prescription. Introduces F.I.T.T principle from ACSM for exercise  prescriptions.  Reviews F.I.T.T. principles of aerobic exercise including progression. Written material given at graduation. Flowsheet Row Cardiac Rehab from 02/04/2021 in [facility] Cardiac and Pulmonary Rehab  Date 01/07/21  Educator AS  Instruction Review Code 1- Verbalizes Understanding       Education: Resistance Exercise: - Group verbal and visual presentation on the components of exercise prescription. Introduces F.I.T.T principle from ACSM for exercise prescriptions  Reviews F.I.T.T. principles of resistance exercise including progression. Written material given at graduation. Flowsheet Row Cardiac Rehab from 02/04/2021 in [facility] Cardiac and Pulmonary Rehab  Date 01/14/21  Educator AS  Instruction Review Code 1- Verbalizes Understanding        Education: Exercise & Equipment Safety: - Individual verbal instruction and demonstration of equipment use and safety with use of the equipment. Flowsheet Row Cardiac Rehab from 02/04/2021 in [facility] Cardiac and Pulmonary Rehab  Education need identified 12/16/20  Date 12/16/20  Educator [place]  Instruction Review Code 1- Verbalizes Understanding       Education: Exercise Physiology & General Exercise Guidelines: - Group verbal and written instruction with models to review the exercise physiology of the cardiovascular system and associated critical values. Provides general exercise guidelines with specific guidelines to those with heart or lung disease.  Flowsheet Row Cardiac Rehab from 02/04/2021 in [facility] Cardiac and Pulmonary Rehab  Date 12/31/20  Educator AS  Instruction Review Code 1- Verbalizes Understanding       Education: Flexibility, Balance, Mind/Body Relaxation: - Group verbal and visual presentation with interactive activity on the components of exercise prescription. Introduces F.I.T.T principle from ACSM for exercise prescriptions. Reviews F.I.T.T. principles of flexibility and balance exercise training including progression. Also  discusses the mind body connection.  Reviews various relaxation techniques to help reduce and manage stress (i.e. Deep breathing, progressive muscle relaxation, and visualization). Balance handout provided to take home. Written material given at graduation. Flowsheet Row Cardiac Rehab from 02/04/2021 in [facility] Cardiac and Pulmonary Rehab  Date 01/21/21  Educator AS  Instruction Review Code 1- Verbalizes Understanding       Activity Barriers & Risk Stratification:  Activity Barriers & Cardiac Risk Stratification - 12/16/20 1523       Activity Barriers & Cardiac Risk Stratification   Activity Barriers Joint Problems;Balance Concerns;Decreased Ventricular Function;Other (comment);Deconditioning    Comments Limited ROM- right shoulder    Cardiac Risk Stratification High             6 Minute Walk:  6 Minute Walk     Row Name 12/16/20 1530         6 Minute Walk  Phase Initial     Distance 1225 feet     Walk Time 6 minutes     # of Rest Breaks 0     MPH 2.32     METS 2.9     RPE 12     Perceived Dyspnea  0     VO2 Peak 10.15     Symptoms No     Resting HR 96 bpm     Resting BP 134/68     Resting Oxygen Saturation  96 %     Exercise Oxygen Saturation  during 6 min walk 97 %     Max Ex. HR 106 bpm     Max Ex. BP 140/64     2 Minute Post BP 122/68              Oxygen Initial Assessment:   Oxygen Re-Evaluation:   Oxygen Discharge (Final Oxygen Re-Evaluation):   Initial Exercise Prescription:  Initial Exercise Prescription - 12/16/20 1500       Date of Initial Exercise RX and Referring Provider   Date 12/16/20    Referring Provider Rockey Situ, TImothy MD      Treadmill   MPH 2.3    Grade 0.5    Minutes 15    METs 2.92      Recumbant Bike   Level 2    RPM 60    Watts 25    Minutes 15    METs 2.9      NuStep   Level 2    SPM 80    Minutes 15    METs 2.9      REL-XR   Level 1    Speed 50    Minutes 15    METs 2.9      T5 Nustep   Level 1     SPM 80    Minutes 15    METs 2.9      Prescription Details   Frequency (times per week) 3    Duration Progress to 30 minutes of continuous aerobic without signs/symptoms of physical distress      Intensity   THRR 40-80% of Max Heartrate 117-138    Ratings of Perceived Exertion 11-13    Perceived Dyspnea 0-4      Progression   Progression Continue to progress workloads to maintain intensity without signs/symptoms of physical distress.      Resistance Training   Training Prescription Yes    Weight 4 lb    Reps 10-15             Perform Capillary Blood Glucose checks as needed.  Exercise Prescription Changes:   Exercise Prescription Changes     Row Name 12/22/20 1200 12/24/20 1400 01/05/21 1000 01/20/21 1300 02/02/21 1300     Response to Exercise   Blood Pressure (Admit) 142/74 -- 128/60 122/64 124/70   Blood Pressure (Exercise) 138/64 -- 128/58 -- --   Blood Pressure (Exit) 128/62 -- 118/60 104/64 106/64   Heart Rate (Admit) 79 bpm -- 82 bpm 106 bpm 89 bpm   Heart Rate (Exercise) 111 bpm -- 109 bpm 119 bpm 125 bpm   Heart Rate (Exit) 101 bpm -- 88 bpm 96 bpm 91 bpm   Rating of Perceived Exertion (Exercise) 12 -- 12 13 13    Symptoms -- -- none none none   Comments second day exercise -- -- -- --   Duration Progress to 30 minutes of  aerobic without signs/symptoms of physical distress -- Continue  with 30 min of aerobic exercise without signs/symptoms of physical distress. Continue with 30 min of aerobic exercise without signs/symptoms of physical distress. Continue with 30 min of aerobic exercise without signs/symptoms of physical distress.   Intensity THRR unchanged -- THRR unchanged THRR unchanged THRR unchanged     Progression   Progression Continue to progress workloads to maintain intensity without signs/symptoms of physical distress. -- Continue to progress workloads to maintain intensity without signs/symptoms of physical distress. Continue to progress workloads  to maintain intensity without signs/symptoms of physical distress. Continue to progress workloads to maintain intensity without signs/symptoms of physical distress.   Average METs 2.93 -- 3.31 3.7 5.12     Resistance Training   Training Prescription Yes -- Yes Yes Yes   Weight 4 lb -- 4 lb 4 lb 5 lb   Reps 10-15 -- 10-15 10-15 10-15     Interval Training   Interval Training -- -- No No No     Treadmill   MPH 2.5 -- 2.8 2.8 3   Grade 1 -- 0.5 3.5 3.5   Minutes 15 -- 15 15 15    METs 3.26 -- 3.26 4.49 4.75     Recumbant Bike   Level -- -- -- 6 --   RPM -- -- -- 60 --   Minutes -- -- -- 15 --   METs -- -- -- 2.88 --     Recumbant Elliptical   Level -- -- -- -- 5   Minutes -- -- -- -- 15   METs -- -- -- -- 4.2     REL-XR   Level 3 -- 5 -- 7   Speed 50 -- -- -- --   Minutes 15 -- 15 -- 15   METs 2.6 -- 4.7 -- 6.4     T5 Nustep   Level -- -- 1 -- --   Minutes -- -- 15 -- --   METs -- -- 2.2 -- --     Home Exercise Plan   Plans to continue exercise at -- Home (comment)  walking, staff videos Home (comment)  walking, staff videos Home (comment)  walking, staff videos Home (comment)  walking, staff videos   Frequency -- Add 2 additional days to program exercise sessions. Add 2 additional days to program exercise sessions. Add 2 additional days to program exercise sessions. Add 2 additional days to program exercise sessions.   Initial Home Exercises Provided -- 12/24/20 12/24/20 12/24/20 12/24/20     Oxygen   Maintain Oxygen Saturation -- -- -- -- 88% or higher    Row Name 02/16/21 1500             Response to Exercise   Blood Pressure (Admit) 112/60       Blood Pressure (Exit) 102/58       Heart Rate (Admit) 94 bpm       Heart Rate (Exercise) 119 bpm       Heart Rate (Exit) 96 bpm       Rating of Perceived Exertion (Exercise) 13       Symptoms none       Duration Continue with 30 min of aerobic exercise without signs/symptoms of physical distress.       Intensity  THRR unchanged               Progression   Progression Continue to progress workloads to maintain intensity without signs/symptoms of physical distress.       Average METs 4.4  Resistance Training   Training Prescription Yes       Weight 5 lb       Reps 10-15               Interval Training   Interval Training No               Treadmill   MPH 3.2       Grade 3.5       Minutes 15       METs 4.99               NuStep   Level 6       Minutes 15       METs 3.8               Home Exercise Plan   Plans to continue exercise at Home (comment)  walking, staff videos       Frequency Add 2 additional days to program exercise sessions.       Initial Home Exercises Provided 12/24/20               Oxygen   Maintain Oxygen Saturation 88% or higher                Exercise Comments:   Exercise Goals and Review:   Exercise Goals     Row Name 12/16/20 1545             Exercise Goals   Increase Physical Activity Yes       Intervention Provide advice, education, support and counseling about physical activity/exercise needs.;Develop an individualized exercise prescription for aerobic and resistive training based on initial evaluation findings, risk stratification, comorbidities and participant's personal goals.       Expected Outcomes Short Term: Attend rehab on a regular basis to increase amount of physical activity.;Long Term: Add in home exercise to make exercise part of routine and to increase amount of physical activity.;Long Term: Exercising regularly at least 3-5 days a week.       Increase Strength and Stamina Yes       Intervention Provide advice, education, support and counseling about physical activity/exercise needs.;Develop an individualized exercise prescription for aerobic and resistive training based on initial evaluation findings, risk stratification, comorbidities and participant's personal goals.       Expected Outcomes Short Term: Increase  workloads from initial exercise prescription for resistance, speed, and METs.;Short Term: Perform resistance training exercises routinely during rehab and add in resistance training at home;Long Term: Improve cardiorespiratory fitness, muscular endurance and strength as measured by increased METs and functional capacity (6MWT)       Able to understand and use rate of perceived exertion (RPE) scale Yes       Intervention Provide education and explanation on how to use RPE scale       Expected Outcomes Short Term: Able to use RPE daily in rehab to express subjective intensity level;Long Term:  Able to use RPE to guide intensity level when exercising independently       Able to understand and use Dyspnea scale Yes       Intervention Provide education and explanation on how to use Dyspnea scale       Expected Outcomes Short Term: Able to use Dyspnea scale daily in rehab to express subjective sense of shortness of breath during exertion;Long Term: Able to use Dyspnea scale to guide intensity level when exercising independently       Knowledge and understanding of Target  Heart Rate Range (THRR) Yes       Intervention Provide education and explanation of THRR including how the numbers were predicted and where they are located for reference       Expected Outcomes Short Term: Able to state/look up THRR;Long Term: Able to use THRR to govern intensity when exercising independently;Short Term: Able to use daily as guideline for intensity in rehab       Able to check pulse independently Yes       Intervention Provide education and demonstration on how to check pulse in carotid and radial arteries.;Review the importance of being able to check your own pulse for safety during independent exercise       Expected Outcomes Short Term: Able to explain why pulse checking is important during independent exercise;Long Term: Able to check pulse independently and accurately       Understanding of Exercise Prescription Yes        Intervention Provide education, explanation, and written materials on patient's individual exercise prescription       Expected Outcomes Short Term: Able to explain program exercise prescription;Long Term: Able to explain home exercise prescription to exercise independently                Exercise Goals Re-Evaluation :  Exercise Goals Re-Evaluation     Row Name 12/17/20 1525 12/22/20 1301 12/24/20 1410 01/05/21 1020 01/08/21 1333     Exercise Goal Re-Evaluation   Exercise Goals Review Increase Physical Activity;Increase Strength and Stamina;Able to understand and use rate of perceived exertion (RPE) scale;Knowledge and understanding of Target Heart Rate Range (THRR);Able to check pulse independently;Understanding of Exercise Prescription Increase Physical Activity;Increase Strength and Stamina Increase Physical Activity;Increase Strength and Stamina;Able to understand and use rate of perceived exertion (RPE) scale;Knowledge and understanding of Target Heart Rate Range (THRR);Understanding of Exercise Prescription;Able to understand and use Dyspnea scale;Able to check pulse independently Increase Physical Activity;Increase Strength and Stamina Increase Physical Activity;Increase Strength and Stamina;Understanding of Exercise Prescription   Comments Reviewed RPE and dyspnea scales, THR and program prescription with pt today.  Pt voiced understanding and was given a copy of goals to take home. Mikki Santee has done well in his first days.  He has increased levels on TM and XR.  Staff will monitor progress. Reviewed home exercise with pt today.  Pt plans to walking with dog and staff videos at home for exercise.  Reviewed THR, pulse, RPE, sign and symptoms, pulse oximetery and when to call 911 or MD.  Also discussed weather considerations and indoor options.  Pt voiced understanding. Mikki Santee is doing well in rehab. He has already increased to level 5 on the XR and increased to a speed of 2.8 on the treadmill. We  will continue to monitor Mikki Santee is doing well in rehab.  He is feeling good and notices a difference in how he is feeling. He is doing his home exercise and walking with the dog. He is doing his balance exercises.   Expected Outcomes Short: Use RPE daily to regulate intensity. Long: Follow program prescription in THR. Short:  attend consistently Long: build overall stamina Short: Start to walk more on off days Long: Improve stamina Short: Increase incline on treadmill Long: Continue to increase overall MET level Short: Continue to add in more walking at home.  Long: Continue improve stamina    Row Name 01/20/21 1334 02/02/21 1319 02/16/21 1503         Exercise Goal Re-Evaluation   Exercise Goals Review Increase  Physical Activity;Increase Strength and Stamina Increase Physical Activity;Increase Strength and Stamina;Understanding of Exercise Prescription Increase Physical Activity;Increase Strength and Stamina     Comments Mikki Santee continues to do well and has increased average MET level.  Staff will continue to monitor progress. Mikki Santee has been doing well in rehab.  He is on level 5 for the recumbent elliptical and level 7 on the XR.  He is feeling like his strength and stamina is getting better and has noticed that some days are better than others.  He was able to cut grass before class today.  He is also working on balance and stretching at home daily.  He has also been walking some days and plans to join a gym after graduation to continue to use treadmill. We will continue to monitor his progress. Mikki Santee has average MET level of 4.4.  He is progressing well with execise and reaches target HR range.  We will continue to monitor progress.     Expected Outcomes Short: contineu to attend consistently Long: build overall stamina Short: Try 6 lb hand weights LOng; continue to improve stamina Short: try 6 lb hand weights Long: build overall stamina              Discharge Exercise Prescription (Final Exercise  Prescription Changes):  Exercise Prescription Changes - 02/16/21 1500       Response to Exercise   Blood Pressure (Admit) 112/60    Blood Pressure (Exit) 102/58    Heart Rate (Admit) 94 bpm    Heart Rate (Exercise) 119 bpm    Heart Rate (Exit) 96 bpm    Rating of Perceived Exertion (Exercise) 13    Symptoms none    Duration Continue with 30 min of aerobic exercise without signs/symptoms of physical distress.    Intensity THRR unchanged      Progression   Progression Continue to progress workloads to maintain intensity without signs/symptoms of physical distress.    Average METs 4.4      Resistance Training   Training Prescription Yes    Weight 5 lb    Reps 10-15      Interval Training   Interval Training No      Treadmill   MPH 3.2    Grade 3.5    Minutes 15    METs 4.99      NuStep   Level 6    Minutes 15    METs 3.8      Home Exercise Plan   Plans to continue exercise at Home (comment)   walking, staff videos   Frequency Add 2 additional days to program exercise sessions.    Initial Home Exercises Provided 12/24/20      Oxygen   Maintain Oxygen Saturation 88% or higher             Nutrition:  Target Goals: Understanding of nutrition guidelines, daily intake of sodium <1528m, cholesterol <2061m calories 30% from fat and 7% or less from saturated fats, daily to have 5 or more servings of fruits and vegetables.  Education: All About Nutrition: -Group instruction provided by verbal, written material, interactive activities, discussions, models, and posters to present general guidelines for heart healthy nutrition including fat, fiber, MyPlate, the role of sodium in heart healthy nutrition, utilization of the nutrition label, and utilization of this knowledge for meal planning. Follow up email sent as well. Written material given at graduation.   Biometrics:  Pre Biometrics - 12/16/20 1522       Pre Biometrics  Height 5' 10.75" (1.797 m)    Weight 196  lb 3.2 oz (89 kg)    BMI (Calculated) 27.56    Single Leg Stand 6.22 seconds              Nutrition Therapy Plan and Nutrition Goals:  Nutrition Therapy & Goals - 12/31/20 1610       Nutrition Therapy   Diet Heart healthy, low Na, diabetes friendly    Drug/Food Interactions Statins/Certain Fruits    Protein (specify units) 70g    Fiber 30 grams    Whole Grain Foods 3 servings    Saturated Fats 12 max. grams    Fruits and Vegetables 8 servings/day    Sodium 1.5 grams      Personal Nutrition Goals   Nutrition Goal ST: try out different recipes, focu on MyPlate LT: become more confident in meal planning    Comments B: piece of toast with butter to let him take medications (whole grain thin bread) with coffee ( creamer no sugar) L: hotdog and some chips or [place] and cheese sandwich with mayo D: portions have reduced. Vegetable with a meat (grill, baked) - eats chicken more often. He reports liking his non-starchy vegetables S: lite yogurt with granola. Drinks: water with crystal lite. He liked to have cookies, but he stopped. He would like to focus on meal planning. He was thinking about having some healthy choice frozen meals just to have on hand - discussed quick meals like summer rolls with pre-cut vegetables, protein, and dip - this can easily put together - he was interested in trying that. Discussed heart healthy eating and diabetes friendly eating.      Intervention Plan   Intervention Prescribe, educate and counsel regarding individualized specific dietary modifications aiming towards targeted core components such as weight, hypertension, lipid management, diabetes, heart failure and other comorbidities.;Nutrition handout(s) given to patient.    Expected Outcomes Short Term Goal: Understand basic principles of dietary content, such as calories, fat, sodium, cholesterol and nutrients.;Short Term Goal: A plan has been developed with personal nutrition goals set during dietitian  appointment.;Long Term Goal: Adherence to prescribed nutrition plan.             Nutrition Assessments:  MEDIFICTS Score Key: ?70 Need to make dietary changes  40-70 Heart Healthy Diet ? 40 Therapeutic Level Cholesterol Diet  Flowsheet Row Cardiac Rehab from 12/16/2020 in [facility] Cardiac and Pulmonary Rehab  Picture Your Plate Total Score on Admission 54      Picture Your Plate Scores: <70 Unhealthy dietary pattern with much room for improvement. 41-50 Dietary pattern unlikely to meet recommendations for good health and room for improvement. 51-60 More healthful dietary pattern, with some room for improvement.  >60 Healthy dietary pattern, although there may be some specific behaviors that could be improved.    Nutrition Goals Re-Evaluation:  Nutrition Goals Re-Evaluation     [place] Name 01/08/21 1338 02/02/21 1341           Goals   Nutrition Goal ST: try out different recipes, focu on MyPlate LT: become more confident in meal planning ST: try out different recipes, focu on MyPlate LT: become more confident in meal planning      Comment Mikki Santee had a good meeting last week.  He is reading food labels and staying away from transfat and saturated fats.  They always make sure that there are vegetables on the table. Mikki Santee is doing better with his diet. He continues to read food  labels and avoids transfat and limits his saturated fats.  They continue to aim for more vegetables. He does aim for a balanced diet.  He has now found that there is not a vegetable that he doesn't like.  He has baked brussell spouts last night.      Expected Outcome Short: Try new recipes  Long; Continue to focus on healthy eating. Short: Continue to try new things Long: Continue to eat a balance diet               Nutrition Goals Discharge (Final Nutrition Goals Re-Evaluation):  Nutrition Goals Re-Evaluation - 02/02/21 1341       Goals   Nutrition Goal ST: try out different recipes, focu on MyPlate LT:  become more confident in meal planning    Comment Mikki Santee is doing better with his diet. He continues to read food labels and avoids transfat and limits his saturated fats.  They continue to aim for more vegetables. He does aim for a balanced diet.  He has now found that there is not a vegetable that he doesn't like.  He has baked brussell spouts last night.    Expected Outcome Short: Continue to try new things Long: Continue to eat a balance diet             Psychosocial: Target Goals: Acknowledge presence or absence of significant depression and/or stress, maximize coping skills, provide positive support system. Participant is able to verbalize types and ability to use techniques and skills needed for reducing stress and depression.   Education: Stress, Anxiety, and Depression - Group verbal and visual presentation to define topics covered.  Reviews how body is impacted by stress, anxiety, and depression.  Also discusses healthy ways to reduce stress and to treat/manage anxiety and depression.  Written material given at graduation.   Education: Sleep Hygiene -Provides group verbal and written instruction about how sleep can affect your health.  Define sleep hygiene, discuss sleep cycles and impact of sleep habits. Review good sleep hygiene tips.    Initial Review & Psychosocial Screening:  Initial Psych Review & Screening - 12/08/20 1542       Initial Review   Current issues with Current Stress Concerns      Family Dynamics   Good Support System? Yes   wife, children, grandchildren     Barriers   Psychosocial barriers to participate in program There are no identifiable barriers or psychosocial needs.;The patient should benefit from training in stress management and relaxation.      Screening Interventions   Interventions Encouraged to exercise;Provide feedback about the scores to participant;To provide support and resources with identified psychosocial needs    Expected Outcomes  Short Term goal: Utilizing psychosocial counselor, staff and physician to assist with identification of specific Stressors or current issues interfering with healing process. Setting desired goal for each stressor or current issue identified.;Long Term Goal: Stressors or current issues are controlled or eliminated.;Short Term goal: Identification and review with participant of any Quality of Life or Depression concerns found by scoring the questionnaire.;Long Term goal: The participant improves quality of Life and PHQ9 Scores as seen by post scores and/or verbalization of changes             Quality of Life Scores:   Quality of Life - 12/16/20 1527       Quality of Life   Select Quality of Life      Quality of Life Scores   Health/Function Pre 23.2 %  Socioeconomic Pre 25.13 %    Psych/Spiritual Pre 23.14 %    Family Pre 27.6 %    GLOBAL Pre 24.26 %            Scores of 19 and below usually indicate a poorer quality of life in these areas.  A difference of  2-3 points is a clinically meaningful difference.  A difference of 2-3 points in the total score of the Quality of Life Index has been associated with significant improvement in overall quality of life, self-image, physical symptoms, and general health in studies assessing change in quality of life.  PHQ-9: Recent Review Flowsheet Data     Depression screen [facility] 2/9 12/16/2020 11/21/2020 08/22/2020   Decreased Interest 0 0 0   Down, Depressed, Hopeless 0 0 0   PHQ - 2 Score 0 0 0   Altered sleeping 0 0 -   Tired, decreased energy 0 1 -   Change in appetite 0 0 -   Feeling bad or failure about yourself  0 0 -   Trouble concentrating 0 0 -   Moving slowly or fidgety/restless 0 0 -   Suicidal thoughts 0 0 -   PHQ-9 Score 0 1 -   Difficult doing work/chores Not difficult at all Not difficult at all -      Interpretation of Total Score  Total Score Depression Severity:  1-4 = Minimal depression, 5-9 = Mild depression, 10-14  = Moderate depression, 15-19 = Moderately severe depression, 20-27 = Severe depression   Psychosocial Evaluation and Intervention:  Psychosocial Evaluation - 12/08/20 1554       Psychosocial Evaluation & Interventions   Interventions Encouraged to exercise with the program and follow exercise prescription    Comments 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 He states he is doing well post op and starting to get back to his usual routine. He is retired from [employer] and likes to stay active.  He and his wife have 6 daughters and 2 sons that live out of town but are very supportive. He made some big lifestyle changes when he had his stents years ago. He and his wife like to kayak and be active so he is ready to get back to that in a safe way. He did report some anxiety/stress from the surgery where he felt the need to make videos for his children in case he didn't make it. He states that he didn't want them to know about it but he is starting to feel more confident now that its been weeks post op. His hope is this program will help build on his foundation of a heart healthy lifestyle    Expected Outcomes Short: attend cardiac rehab for education and exericse. Long: develop and maintain positive self care habits.    Continue Psychosocial Services  Follow up required by staff             Psychosocial Re-Evaluation:  Psychosocial Re-Evaluation     Row Name 01/08/21 1335 02/02/21 1346           Psychosocial Re-Evaluation   Current issues with Current Stress Concerns Current Stress Concerns      Comments Mikki Santee is doing well in rehab. He has good days and great day!  He always tries to focus on the positive things. His anxiiety is getting better as he gets more information and being able to do more and more each day. He sleeps very well  most nights. Mikki Santee is doing well in rehab.  He about two weeks out from his youngest daughter getted married in [place].  He is said that he  is just ready for it to be over. His futrue son in law works for [employer] up there and she is working for the court system.  He continues to sleep well.  He has a new puppy at home that they are working on training.      Expected Outcomes Short: Continue to exercise for mental boost Long: Continue to focus on positive Short; Get tjhrough wedding in a couple of weeks Long: Continue to focus on positive      Interventions Encouraged to attend Cardiac Rehabilitation for the exercise Encouraged to attend Cardiac Rehabilitation for the exercise      Continue Psychosocial Services  Follow up required by staff --               Psychosocial Discharge (Final Psychosocial Re-Evaluation):  Psychosocial Re-Evaluation - 02/02/21 1346       Psychosocial Re-Evaluation   Current issues with Current Stress Concerns    Comments Mikki Santee is doing well in rehab.  He about two weeks out from his youngest daughter getted married in [place].  He is said that he is just ready for it to be over. His futrue son in law works for [employer] up there and she is working for the court system.  He continues to sleep well.  He has a new puppy at home that they are working on training.    Expected Outcomes Short; Get tjhrough wedding in a couple of weeks Long: Continue to focus on positive    Interventions Encouraged to attend Cardiac Rehabilitation for the exercise             Vocational Rehabilitation: Provide vocational rehab assistance to qualifying candidates.   Vocational Rehab Evaluation & Intervention:  Vocational Rehab - 12/08/20 1542       Initial Vocational Rehab Evaluation & Intervention   Assessment shows need for Vocational Rehabilitation No             Education: Education Goals: Education classes will be provided on a variety of topics geared toward better understanding of heart health and risk factor modification. Participant will state understanding/return demonstration of topics presented as  noted by education test scores.  Learning Barriers/Preferences:  Learning Barriers/Preferences - 12/08/20 1541       Learning Barriers/Preferences   Learning Barriers None    Learning Preferences None             General Cardiac Education Topics:  AED/CPR: - Group verbal and written instruction with the use of models to demonstrate the basic use of the AED with the basic ABC's of resuscitation.   Anatomy and Cardiac Procedures: - Group verbal and visual presentation and models provide information about basic cardiac anatomy and function. Reviews the testing methods done to diagnose heart disease and the outcomes of the test results. Describes the treatment choices: Medical Management, Angioplasty, or Coronary Bypass Surgery for treating various heart conditions including Myocardial Infarction, Angina, Valve Disease, and Cardiac Arrhythmias.  Written material given at graduation. Flowsheet Row Cardiac Rehab from 02/04/2021 in [facility] Cardiac and Pulmonary Rehab  Date 01/14/21  Educator [facility]  Instruction Review Code 1- Verbalizes Understanding       Medication Safety: - Group verbal and visual instruction to review commonly prescribed medications for heart and lung disease. Reviews the medication, class of the drug,  and side effects. Includes the steps to properly store meds and maintain the prescription regimen.  Written material given at graduation.   Intimacy: - Group verbal instruction through game format to discuss how heart and lung disease can affect sexual intimacy. Written material given at graduation.. Flowsheet Row Cardiac Rehab from 02/04/2021 in [facility] Cardiac and Pulmonary Rehab  Date 01/07/21  Educator [place]  Instruction Review Code 1- Verbalizes Understanding       Know Your Numbers and Heart Failure: - Group verbal and visual instruction to discuss disease risk factors for cardiac and pulmonary disease and treatment options.  Reviews associated critical values for  Overweight/Obesity, Hypertension, Cholesterol, and Diabetes.  Discusses basics of heart failure: signs/symptoms and treatments.  Introduces Heart Failure Zone chart for action plan for heart failure.  Written material given at graduation.   Infection Prevention: - Provides verbal and written material to individual with discussion of infection control including proper hand washing and proper equipment cleaning during exercise session. Flowsheet Row Cardiac Rehab from 02/04/2021 in [facility] Cardiac and Pulmonary Rehab  Education need identified 12/16/20  Date 12/16/20  Educator [place]  Instruction Review Code 1- Verbalizes Understanding       Falls Prevention: - Provides verbal and written material to individual with discussion of falls prevention and safety. Flowsheet Row Cardiac Rehab from 02/04/2021 in [facility] Cardiac and Pulmonary Rehab  Education need identified 12/16/20  Date 12/16/20  Educator [place]  Instruction Review Code 1- Verbalizes Understanding       Other: -Provides group and verbal instruction on various topics (see comments)   Knowledge Questionnaire Score:  Knowledge Questionnaire Score - 12/16/20 1527       Knowledge Questionnaire Score   Pre Score 24/26: Angina, Exercise             Core Components/Risk Factors/Patient Goals at Admission:  Personal Goals and Risk Factors at Admission - 12/16/20 1546       Core Components/Risk Factors/Patient Goals on Admission    Weight Management Yes;Weight Loss    Intervention Weight Management: Develop a combined nutrition and exercise program designed to reach desired caloric intake, while maintaining appropriate intake of nutrient and fiber, sodium and fats, and appropriate energy expenditure required for the weight goal.;Weight Management: Provide education and appropriate resources to help participant work on and attain dietary goals.;Weight Management/Obesity: Establish reasonable short term and long term weight goals.     Admit Weight 196 lb (88.9 kg)    Goal Weight: Short Term 191 lb (86.6 kg)    Goal Weight: Long Term 186 lb (84.4 kg)    Expected Outcomes Short Term: Continue to assess and modify interventions until short term weight is achieved;Long Term: Adherence to nutrition and physical activity/exercise program aimed toward attainment of established weight goal;Weight Loss: Understanding of general recommendations for a balanced deficit meal plan, which promotes 1-2 lb weight loss per week and includes a negative energy balance of [phone] kcal/d;Understanding recommendations for meals to include 15-35% energy as protein, 25-35% energy from fat, 35-60% energy from carbohydrates, less than 273m of dietary cholesterol, 20-35 gm of total fiber daily;Understanding of distribution of calorie intake throughout the day with the consumption of 4-5 meals/snacks    Diabetes Yes    Intervention Provide education about signs/symptoms and action to take for hypo/hyperglycemia.;Provide education about proper nutrition, including hydration, and aerobic/resistive exercise prescription along with prescribed medications to achieve blood glucose in normal ranges: Fasting glucose 65-99 mg/dL    Hypertension Yes    Intervention  Provide education on lifestyle modifcations including regular physical activity/exercise, weight management, moderate sodium restriction and increased consumption of fresh fruit, vegetables, and low fat dairy, alcohol moderation, and smoking cessation.;Monitor prescription use compliance.    Expected Outcomes Short Term: Continued assessment and intervention until BP is < 140/47m HG in hypertensive participants. < 130/853mHG in hypertensive participants with diabetes, heart failure or chronic kidney disease.;Long Term: Maintenance of blood pressure at goal levels.    Lipids Yes    Intervention Provide education and support for participant on nutrition & aerobic/resistive exercise along with prescribed  medications to achieve LDL <7092mHDL >58m78m           Education:Diabetes - Individual verbal and written instruction to review signs/symptoms of diabetes, desired ranges of glucose level fasting, after meals and with exercise. Acknowledge that pre and post exercise glucose checks will be done for 3 sessions at entry of program. [facility]  Education need identified 12/16/20  Date 12/16/20  Educator [place]  [place]ortonstruction Review Code 1- Verbalizes Understanding       Core Components/Risk Factors/Patient Goals Review:   Goals and Risk Factor Review     Row Name 01/08/21 1340 02/02/21 1344           Core Components/Risk Factors/Patient Goals Review   Personal Goals Review Weight Management/Obesity;Hypertension;Diabetes;Lipids Weight Management/Obesity;Hypertension;Diabetes;Lipids      Review Bob Mikki Santeeholding steady on his weight and keeping his weight under 200 lb.  He has kept it down for the last 6-7 years.  Blood pressures are doing well in class. He doesn't check them at home as he feels they are doing well.  His blood sugars have been good and he does check them at home routinely. Bob Mikki Santeedoing well in rehab.  His weight is staying between 196-199 lb. He is pleased with his progress and continues to work to get it down.  Blood pressures and sugars continue to do well. Overall, feeling pretty good.      Expected Outcomes Short: Continue to monitor blood sugars Long: Continue to montior risk factors. Short: Continue to work on weight loss Long: Continue to monitor risk factors.               Core Components/Risk Factors/Patient Goals at Discharge (Final Review):   Goals and Risk Factor Review - 02/02/21 1344       Core Components/Risk Factors/Patient Goals Review   Personal Goals Review Weight Management/Obesity;Hypertension;Diabetes;Lipids    Review Bob Mikki Santeedoing well in rehab.  His weight is staying between  196-199 lb. He is pleased with his progress and continues to work to get it down.  Blood pressures and sugars continue to do well. Overall, feeling pretty good.    Expected Outcomes Short: Continue to work on weight loss Long: Continue to monitor risk factors.             ITP Comments:  ITP Comments     Row Name 12/08/20 1539 12/16/20 1513 12/17/20 1524 12/24/20 0744 12/31/20 1609   ITP Comments Initial telephone orientation completed. Diagnosis can be found in [facility]. EP orientation scheduled for Tuesday 8/2 at 10am. Completed 6MWT and gym orientation. Initial ITP created and sent for review to Dr. MarkEmily Filbertdical Director. First full day of exercise!  Patient was oriented to gym and equipment including functions, settings, policies, and procedures.  Patient's individual exercise prescription and treatment plan were reviewed.  All  starting workloads were established based on the results of the 6 minute walk test done at initial orientation visit.  The plan for exercise progression was also introduced and progression will be customized based on patient's performance and goals. 30 Day review completed. Medical Director ITP review done, changes made as directed, and signed approval by Medical Director.    New to program Completed initial RD consultation    Row Name 01/21/21 [phone] 02/18/21 0939         ITP Comments 30 Day review completed. Medical Director ITP review done, changes made as directed, and signed approval by Medical Director. 30 day review completed. ITP sent to Dr. Emily Filbert, Medical Director of Cardiac Rehab. Continue with ITP unless changes are made by physician.               Comments: 30 day review

## 2021-02-26 ENCOUNTER — Telehealth: Payer: Self-pay | Admitting: Cardiovascular Disease

## 2021-02-26 ENCOUNTER — Other Ambulatory Visit: Payer: Self-pay

## 2021-02-26 ENCOUNTER — Encounter: Payer: Managed Care, Other (non HMO) | Admitting: *Deleted

## 2021-02-26 DIAGNOSIS — Z951 Presence of aortocoronary bypass graft: Secondary | ICD-10-CM

## 2021-02-26 DIAGNOSIS — Z48812 Encounter for surgical aftercare following surgery on the circulatory system: Secondary | ICD-10-CM | POA: Diagnosis not present

## 2021-02-26 NOTE — Telephone Encounter (Signed)
Secure message sent from [name], RN from Cardiac [facility]hab [facility] regarding rhythm strips, this is not stress test results  "[name] We just saw Ronald Roth (DOB 04/24/2051) here in Cardiac Rehab. He just returned from a trip and the last few days he noticed his heart rate has been in the 40s, feeling washed out/dizzy/feeling off, up 4lbs.  BP has been fine. We printed out his last session and today's session to show the rhythm change. He dropped them off at your office around 2:30.  Upon reviewing his chart I saw where they said he dropped off his stress test results... that was not what he delivered. I am not sure if he shared his symptoms or concerns, but I wanted you all to be aware of his symptoms and rhythm differences. Thank you!"  Message back to sender to see if she can call to get pt in for OV, next available appt with Dr. [name] or APP  Will hold on to rhythm strips for Dr. [name] to review.

## 2021-02-26 NOTE — Progress Notes (Signed)
Daily Session Note  Patient Details  Name: Ronald Roth MRN: [id number] Date of Birth: 13-Jun-1950 Referring Provider:   Flowsheet Row Cardiac Rehab from 12/16/2020 in [facility] Cardiac and Pulmonary Rehab  Referring Provider [name] MD       Encounter Date: 02/26/2021  Check In:  Session Check In - 02/26/21 1332       Check-In   Supervising physician immediately available to respond to emergencies See telemetry face sheet for immediately available ER MD    Location [facility]-Cardiac & Pulmonary Rehab    Staff Present Renita Papa, RN BSN;Joseph [place], RCP,RRT,BSRT;Jessica [place], [place], RCEP, CCRP, CCET    Virtual Visit No    Medication changes reported     No    Fall or balance concerns reported    No    Warm-up and Cool-down Performed on first and last piece of equipment    Resistance Training Performed Yes    VAD Patient? No    PAD/SET Patient? No      Pain Assessment   Currently in Pain? No/denies                Social History   Tobacco Use  Smoking Status Former   Packs/day: 1.00   Years: 30.00   Pack years: 30.00   Types: Cigarettes   Quit date: 04/18/1991   Years since quitting: 29.8  Smokeless Tobacco Never    Goals Met:  Independence with exercise equipment Exercise tolerated well No report of concerns or symptoms today Strength training completed today  Goals Unmet:  Not Applicable  Comments: Pt able to follow exercise prescription today without complaint.  Will continue to monitor for progression.    Dr. Emily Filbert is Medical Director for [place].  Dr. Ottie Glazier is Medical Director for [facility] Pulmonary Rehabilitation.

## 2021-02-26 NOTE — Telephone Encounter (Signed)
Patient stopped in & dropped off stress test results, put in box

## 2021-03-02 ENCOUNTER — Other Ambulatory Visit: Payer: Self-pay

## 2021-03-02 DIAGNOSIS — Z951 Presence of aortocoronary bypass graft: Secondary | ICD-10-CM

## 2021-03-02 DIAGNOSIS — Z48812 Encounter for surgical aftercare following surgery on the circulatory system: Secondary | ICD-10-CM | POA: Diagnosis not present

## 2021-03-02 NOTE — Progress Notes (Signed)
Daily Session Note  Patient Details  Name: Ronald Roth MRN: [id number] Date of Birth: Oct 03, 1950 Referring Provider:   Flowsheet Row Cardiac Rehab from 12/16/2020 in [facility] Cardiac and Pulmonary Rehab  Referring Provider [name] MD       Encounter Date: 03/02/2021  Check In:  Session Check In - 03/02/21 1339       Check-In   Supervising physician immediately available to respond to emergencies See telemetry face sheet for immediately available ER MD    Location [facility]-Cardiac & Pulmonary Rehab    Staff Present Birdie Sons, MPA, Nino Glow, MS, ASCM CEP, Exercise Physiologist;Joseph Tessie Fass, [place]    Virtual Visit No    Medication changes reported     No    Fall or balance concerns reported    No    Tobacco Cessation No Change    Warm-up and Cool-down Performed on first and last piece of equipment    Resistance Training Performed Yes    VAD Patient? No    PAD/SET Patient? No      Pain Assessment   Currently in Pain? No/denies                Social History   Tobacco Use  Smoking Status Former   Packs/day: 1.00   Years: 30.00   Pack years: 30.00   Types: Cigarettes   Quit date: 04/18/1991   Years since quitting: 29.8  Smokeless Tobacco Never    Goals Met:  Independence with exercise equipment Exercise tolerated well No report of concerns or symptoms today Strength training completed today  Goals Unmet:  Not Applicable  Comments: Pt able to follow exercise prescription today without complaint.  Has doctor appointment Friday, 10/21, concerning fluctuating heart rate and change in rhythm. Continues to be asymptomatic.Will continue to monitor for progression.    Dr. Emily Filbert is Medical Director for [place].  Dr. Ottie Glazier is Medical Director for [facility] Pulmonary Rehabilitation.

## 2021-03-04 ENCOUNTER — Other Ambulatory Visit: Payer: Self-pay

## 2021-03-04 VITALS — Ht 70.75 in | Wt 207.3 lb

## 2021-03-04 DIAGNOSIS — Z48812 Encounter for surgical aftercare following surgery on the circulatory system: Secondary | ICD-10-CM | POA: Diagnosis not present

## 2021-03-04 DIAGNOSIS — Z951 Presence of aortocoronary bypass graft: Secondary | ICD-10-CM

## 2021-03-04 NOTE — Progress Notes (Signed)
Daily Session Note  Patient Details  Name: Ronald Roth MRN: [id number] Date of Birth: 05-03-1951 Referring Provider:   Flowsheet Row Cardiac Rehab from 12/16/2020 in [facility] Cardiac and Pulmonary Rehab  Referring Provider [name] MD       Encounter Date: 03/04/2021  Check In:  Session Check In - 03/04/21 1331       Check-In   Supervising physician immediately available to respond to emergencies See telemetry face sheet for immediately available ER MD    Location [facility]-Cardiac & Pulmonary Rehab    Staff Present Birdie Sons, MPA, RN;Jessica Gridley, MA, RCEP, CCRP, CCET;Joseph Yunique [place], [place]    Virtual Visit No    Medication changes reported     No    Fall or balance concerns reported    No    Tobacco Cessation No Change    Warm-up and Cool-down Performed on first and last piece of equipment    Resistance Training Performed Yes    VAD Patient? No    PAD/SET Patient? No      Pain Assessment   Currently in Pain? No/denies                Social History   Tobacco Use  Smoking Status Former   Packs/day: 1.00   Years: 30.00   Pack years: 30.00   Types: Cigarettes   Quit date: 04/18/1991   Years since quitting: 29.8  Smokeless Tobacco Never    Goals Met:  Independence with exercise equipment Exercise tolerated well No report of concerns or symptoms today Strength training completed today  Goals Unmet:  Not Applicable  Comments: Pt able to follow exercise prescription today without complaint.  Will continue to monitor for progression.   [place] Name 12/16/20 1530 03/04/21 1406       6 Minute Walk   Phase Initial Discharge    Distance 1225 feet 1290 feet    Distance % Change -- 5.3 %    Distance Feet Change -- 65 ft    Walk Time 6 minutes 6 minutes    # of Rest Breaks 0 0    MPH 2.32 2.44    METS 2.9 2.76    RPE 12 12    Perceived Dyspnea  0 2    VO2 Peak 10.15 9.66    Symptoms No Yes (comment)    Comments -- knee pain  5/10, SOB    Resting HR 96 bpm 75 bpm    Resting BP 134/68 114/66    Resting Oxygen Saturation  96 % --    Exercise Oxygen Saturation  during 6 min walk 97 % --    Max Ex. HR 106 bpm 77 bpm    Max Ex. BP 140/64 166/74    2 Minute Post BP 122/68 --              Dr. Emily Filbert is Medical Director for [place].  Dr. Ottie Glazier is Medical Director for [facility] Pulmonary Rehabilitation.

## 2021-03-05 ENCOUNTER — Other Ambulatory Visit: Payer: Self-pay

## 2021-03-05 ENCOUNTER — Encounter: Payer: Managed Care, Other (non HMO) | Admitting: *Deleted

## 2021-03-05 DIAGNOSIS — Z951 Presence of aortocoronary bypass graft: Secondary | ICD-10-CM

## 2021-03-05 DIAGNOSIS — Z48812 Encounter for surgical aftercare following surgery on the circulatory system: Secondary | ICD-10-CM | POA: Diagnosis not present

## 2021-03-05 NOTE — Progress Notes (Signed)
Daily Session Note  Patient Details  Name: Ronald Roth MRN: [id number] Date of Birth: 04/09/1951 Referring Provider:   Flowsheet Row Cardiac Rehab from 12/16/2020 in [facility] Cardiac and Pulmonary Rehab  Referring Provider [name] MD       Encounter Date: 03/05/2021  Check In:  Session Check In - 03/05/21 1342       Check-In   Supervising physician immediately available to respond to emergencies See telemetry face sheet for immediately available ER MD    Location [facility]-Cardiac & Pulmonary Rehab    Staff Present Renita Papa, RN BSN;Joseph [place], RCP,RRT,BSRT;Jessica [place], [place], RCEP, CCRP, CCET    Virtual Visit No    Medication changes reported     No    Fall or balance concerns reported    No    Warm-up and Cool-down Performed on first and last piece of equipment    Resistance Training Performed Yes    VAD Patient? No    PAD/SET Patient? No      Pain Assessment   Currently in Pain? No/denies                Social History   Tobacco Use  Smoking Status Former   Packs/day: 1.00   Years: 30.00   Pack years: 30.00   Types: Cigarettes   Quit date: 04/18/1991   Years since quitting: 29.9  Smokeless Tobacco Never    Goals Met:  Independence with exercise equipment Exercise tolerated well No report of concerns or symptoms today Strength training completed today  Goals Unmet:  Not Applicable  Comments: Pt able to follow exercise prescription today without complaint.  Will continue to monitor for progression.    Dr. Emily Filbert is Medical Director for [facility].  Dr. Ottie Glazier is Medical Director for [facility] Pulmonary Rehabilitation.

## 2021-03-06 ENCOUNTER — Other Ambulatory Visit
Admission: RE | Admit: 2021-03-06 | Discharge: 2021-03-06 | Disposition: A | Discharge status: Home / Self Care | Payer: Managed Care, Other (non HMO) | Source: Ambulatory Visit | Attending: Physician Assistant | Admitting: Physician Assistant

## 2021-03-06 ENCOUNTER — Ambulatory Visit
Admission: RE | Admit: 2021-03-06 | Discharge: 2021-03-06 | Disposition: A | Discharge status: Home / Self Care | Payer: Managed Care, Other (non HMO) | Source: Ambulatory Visit | Attending: Physician Assistant | Admitting: Physician Assistant

## 2021-03-06 ENCOUNTER — Telehealth: Payer: Self-pay | Admitting: Physician Assistant

## 2021-03-06 ENCOUNTER — Other Ambulatory Visit: Payer: Self-pay

## 2021-03-06 ENCOUNTER — Ambulatory Visit (INDEPENDENT_AMBULATORY_CARE_PROVIDER_SITE_OTHER): Payer: Managed Care, Other (non HMO) | Admitting: Physician Assistant

## 2021-03-06 ENCOUNTER — Encounter: Payer: Self-pay | Admitting: Physician Assistant

## 2021-03-06 VITALS — BP 120/84 | HR 52 | Ht 70.5 in | Wt 208.4 lb

## 2021-03-06 DIAGNOSIS — Z951 Presence of aortocoronary bypass graft: Secondary | ICD-10-CM

## 2021-03-06 DIAGNOSIS — Z8679 Personal history of other diseases of the circulatory system: Secondary | ICD-10-CM

## 2021-03-06 DIAGNOSIS — E785 Hyperlipidemia, unspecified: Secondary | ICD-10-CM

## 2021-03-06 DIAGNOSIS — R0602 Shortness of breath: Secondary | ICD-10-CM | POA: Insufficient documentation

## 2021-03-06 DIAGNOSIS — I255 Ischemic cardiomyopathy: Secondary | ICD-10-CM

## 2021-03-06 DIAGNOSIS — I2510 Atherosclerotic heart disease of native coronary artery without angina pectoris: Secondary | ICD-10-CM | POA: Diagnosis not present

## 2021-03-06 DIAGNOSIS — I5020 Unspecified systolic (congestive) heart failure: Secondary | ICD-10-CM

## 2021-03-06 DIAGNOSIS — I4430 Unspecified atrioventricular block: Secondary | ICD-10-CM

## 2021-03-06 DIAGNOSIS — R002 Palpitations: Secondary | ICD-10-CM

## 2021-03-06 DIAGNOSIS — I4510 Unspecified right bundle-branch block: Secondary | ICD-10-CM

## 2021-03-06 DIAGNOSIS — I10 Essential (primary) hypertension: Secondary | ICD-10-CM

## 2021-03-06 DIAGNOSIS — E1140 Type 2 diabetes mellitus with diabetic neuropathy, unspecified: Secondary | ICD-10-CM

## 2021-03-06 DIAGNOSIS — I440 Atrioventricular block, first degree: Secondary | ICD-10-CM

## 2021-03-06 LAB — CBC
HCT: 43 % (ref 39.0–52.0)
Hemoglobin: 14.5 g/dL (ref 13.0–17.0)
MCH: 30.3 pg (ref 26.0–34.0)
MCHC: 33.7 g/dL (ref 30.0–36.0)
MCV: 90 fL (ref 80.0–100.0)
Platelets: 195 10*3/uL (ref 150–400)
RBC: 4.78 MIL/uL (ref 4.22–5.81)
RDW: 15.9 % — ABNORMAL HIGH (ref 11.5–15.5)
WBC: 10 10*3/uL (ref 4.0–10.5)
nRBC: 0 % (ref 0.0–0.2)

## 2021-03-06 LAB — BASIC METABOLIC PANEL
Anion gap: 10 (ref 5–15)
BUN: 17 mg/dL (ref 8–23)
CO2: 24 mmol/L (ref 22–32)
Calcium: 9 mg/dL (ref 8.9–10.3)
Chloride: 107 mmol/L (ref 98–111)
Creatinine, Ser: 1.14 mg/dL (ref 0.61–1.24)
GFR, Estimated: >60 mL/min (ref >60)
Glucose, Bld: 126 mg/dL — ABNORMAL HIGH (ref 70–99)
Potassium: 4.2 mmol/L (ref 3.5–5.1)
Sodium: 141 mmol/L (ref 135–145)

## 2021-03-06 LAB — D-DIMER, QUANTITATIVE: D-Dimer, Quant: 1.06 ug/mL-FEU — ABNORMAL HIGH (ref 0.00–0.50)

## 2021-03-06 LAB — BRAIN NATRIURETIC PEPTIDE: B Natriuretic Peptide: 618.2 pg/mL — ABNORMAL HIGH (ref 0.0–100.0)

## 2021-03-06 MED ORDER — FUROSEMIDE 20 MG PO TABS: 20.0000 mg | ORAL_TABLET | Freq: Every day | ORAL | 3 refills | Status: DC

## 2021-03-06 MED ORDER — IOHEXOL 350 MG/ML SOLN
75.0000 mL | Freq: Once | INTRAVENOUS | Status: AC | PRN
  Administered 2021-03-06: 75 mL via INTRAVENOUS

## 2021-03-06 MED ORDER — POTASSIUM CHLORIDE ER 10 MEQ PO TBCR: 10.0000 meq | EXTENDED_RELEASE_TABLET | Freq: Every day | ORAL | 3 refills | Status: DC

## 2021-03-06 NOTE — Telephone Encounter (Signed)
Authorization obtained from the patient's insurance per Florina Ou (Pre-Cert).  I called and radiology scheduling and they are advising the patient come for a work in appointment at [facility]. He may come now, but will be a workin.  I have advised scheduling the patient will be there in ~ 15 minutes.  I called and spoke with the patient. He voices understanding the he can be worked in for a CT appointment today.  I have asked him to report to the [facility] at [facility]- Registration, to check in for he work in CT appointment. I have advised the patient that I have asked radiology to hold him until we are called with the CT report.  The patient again voices understanding and is agreeable with the above POC.

## 2021-03-06 NOTE — Progress Notes (Addendum)
Office Visit    Patient Name: Ronald Roth Date of Encounter: 03/06/2021  PCP:  Ronald Cords, DO   [facility]  Cardiologist:  Julien Nordmann, MD  Advanced Practice Provider:  No care team member to display Electrophysiologist:  None  :[id number]}   Chief Complaint    Chief Complaint  Patient presents with   Other    Irregular heart rhythm/ Chest discomfort/ Fatigue. Meds reviewed verbally with pt.    70 y.o. male with history of CAD s/p remote PCI approximately 10 years prior s/p PCI s/p four-vessel CABG 10/31/2020 with LIMA to LAD, SVG to diagonal, SVG to RPDA, RPL B, HFrEF secondary to ICM, DM2, hypertension, hyperlipidemia, strong family history of CAD and prior tobacco use, and who presents today for follow-up of CAD.  Past Medical History    Past Medical History:  Diagnosis Date   Anxiety    Coronary artery disease    Diabetes mellitus without complication (HCC)    GERD (gastroesophageal reflux disease)    Heart attack (HCC) 2012   Hypertension    Sciatica    Past Surgical History:  Procedure Laterality Date   CARDIAC CATHETERIZATION     CORONARY ANGIOPLASTY WITH STENT PLACEMENT  2012   x 1   CORONARY ARTERY BYPASS GRAFT N/A 10/31/2020   Procedure: CORONARY ARTERY BYPASS GRAFTING (CABG)x 4 ON CARDIOPULMONARY BYPASS. LEFT INTERNAL MAMMARY ARTERY AND RIGHT GREATER SAPHENOUS VEIN. LIMA TO LAD, SVG TO DIAG., SVG TO PDA  AND PLB;  Surgeon: Linden Dolin, MD;  Location: [facility] OR;  Service: Open Heart Surgery;  Laterality: N/A;   ENDOVEIN HARVEST OF GREATER SAPHENOUS VEIN Right 10/31/2020   Procedure: ENDOVEIN HARVEST OF GREATER SAPHENOUS VEIN;  Surgeon: Linden Dolin, MD;  Location: [facility] OR;  Service: Open Heart Surgery;  Laterality: Right;   LEFT HEART CATH AND CORONARY ANGIOGRAPHY N/A 10/20/2020   Procedure: LEFT HEART CATH AND CORONARY ANGIOGRAPHY;  Surgeon: Yvonne Kendall, MD;  Location: [facility] INVASIVE CV LAB;  Service:  Cardiovascular;  Laterality: N/A;   Ruptured disc     Sciatica    STENT PLACEMENT VASCULAR ([facility] HX)  2012   TEE WITHOUT CARDIOVERSION N/A 10/31/2020   Procedure: TRANSESOPHAGEAL ECHOCARDIOGRAM (TEE);  Surgeon: Linden Dolin, MD;  Location: [facility] OR;  Service: Open Heart Surgery;  Laterality: N/A;    Allergies  No Known Allergies  History of Present Illness    [name] is a 70 y.o. male with PMH as above.  He established care 09/16/2020 for CAD.  He noted exertional fatigue, shortness of breath, and angina.  MPI 09/25/2020 was ruled moderate risk scan.  Subsequent echo EF 25 to 30%.  Diagnostic LHC 10/20/2020 showed severe 3 V CAD as below.  Given his severe multivessel CAD, he was referred to CV TS with recommendation for CABG.  Subsequent preoperative carotid duplex showed 40 to 59% R ICA stenosis with no significant LICA stenosis.  He underwent four-vessel CABG 10/31/2020.  Postoperative course was notable for brief episode of high-grade AV block, volume overload with symptomatic involvement with diuresis, AKI, and acute blood loss anemia.  He was seen 11/2020 and doing well from a cardiac standpoint.  He noted a brief episode of palpitations and flushing 7/4.  He also had an isolated heart rate reading on his watch of 157 bpm.  Recommendation was for furosemide 20 mg once daily for 3 days.  He was started on Entresto 24-26 twice daily.   He was  seen by his primary cardiologist 12/16/2020 and reportedly feeling well.  He reported his heart rate running high with exertion.  Zio monitor as below with some runs of SVT and ectopy.  First-degree AV block and bundle branch block/IVCD present.  His metoprolol 25 mg twice daily was changed to metoprolol 25 in the AM and 50 in the p.m.  It was noted that he may need metoprolol 50 mg twice daily in the future.  Today, he reports that he is short of breath with exertion for the last week.  He also has had a cramp on his left side.  No current shortness of  breath or chest pain.  He notes concern regarding palpitations that occurred during cardiac rehab the other week.  He is surprised regarding this feeling, as he walked 3 miles for race without any issue recently.  He continues to exercise 3 times per week.  2 weeks ago, he had a long car trip that was over 30 hours in his wife's small car (daughter's wedding).  He reports having right knee pain after that time but denies any palpable cord or asymmetric edema.  On exam as below, asymmetric edema and erythema noted.  In addition to shortness of breath, he has noted weight gain over the last week. He has been fatigued.  Home Medications   Current Outpatient Medications  Medication Instructions   acetaminophen (TYLENOL) 1,000 mg, Oral, Every 6 hours PRN   aspirin EC 81 mg, Oral, Every morning, Swallow whole.   atorvastatin (LIPITOR) 80 mg, Oral, Every morning, Take 80 mg by mouth in the morning.   diphenhydrAMINE (BENADRYL) 75 mg, Oral, Daily at bedtime   ezetimibe (ZETIA) 10 mg, Oral, Every morning   fenofibrate (TRICOR) 145 mg, Oral, Every morning   furosemide (LASIX) 20 mg, Oral, Daily   Jardiance 25 mg, Oral, Daily before breakfast   metoprolol succinate (TOPROL-XL) 50 mg, Oral, Daily, Take with or immediately following a meal.   nitroGLYCERIN (NITROSTAT) 0.4 mg, Sublingual, Every 5 min x3 PRN   omeprazole (PRILOSEC) 20 mg, Oral, Every morning   OZEMPIC, 0.25 OR 0.5 MG/DOSE, 2 MG/1.5ML SOPN INJECT SUBCUTANEOUSLY 0.5MG  ONCE WEEKLY   potassium chloride (KLOR-CON) 10 MEQ tablet 10 mEq, Oral, Daily   sacubitril-valsartan (ENTRESTO) 24-26 MG 1 tablet, Oral, 2 times daily   Vitamin D (Ergocalciferol) (DRISDOL) 50,000 Units, Oral, 2 times weekly, Sundays & Thursdays     Review of Systems    He reports shortness of breath with exertion, palpitations, weight gain. He denies chest pain, pnd, orthopnea, n, v, syncope, edema, or early satiety.   All other systems reviewed and are otherwise negative  except as noted above.  Physical Exam    VS:  BP 120/84 (BP Location: Left Arm, Patient Position: Sitting, Cuff Size: Normal)   Pulse (!) 52   Ht 5' 10.5" (1.791 m)   Wt 208 lb 6 oz (94.5 kg)   SpO2 98%   BMI 29.48 kg/m  , BMI Body mass index is 29.48 kg/m. GEN: Well nourished, well developed, in no acute distress. HEENT: normal. Neck: Supple, no JVD, carotid bruits, or masses. Cardiac: bradycardic but regular, 2/6 systolic murmur, rubs, or gallops. No clubbing, cyanosis.  Right lower extremity edema moderate to 1+ and greater than that of left lower extremity edema with right lower extremity erythema.  He reports improving right knee pain.  Radials/DP/PT 2+ and equal bilaterally.  Respiratory:  Respirations regular and unlabored, trace RLL crackles. GI: Soft, nontender, nondistended, BS +  x 4. MS: no deformity or atrophy. Skin: warm and dry, no rash. Neuro:  Strength and sensation are intact. Psych: Normal affect.  Accessory Clinical Findings    ECG personally reviewed by me today -sinus bradycardia with sinus arrhythmia, first-degree AV block (known), left axis deviation, baseline wander, incomplete RBBB, new T wave inversion noted in V1 through V3, poor R wave progression inferior and precordial leads, LVH with repolarization abnormality, reviewed with DOD and Nicolasa Ducking, NP same day- no acute changes.  VITALS Reviewed today   Temp Readings from Last 3 Encounters:  11/04/20 98.5 F (36.9 C) (Oral)  10/29/20 98 F (36.7 C)  10/20/20 98 F (36.7 C)   BP Readings from Last 3 Encounters:  03/06/21 120/84  12/16/20 140/80  11/25/20 116/72   Pulse Readings from Last 3 Encounters:  03/06/21 (!) 52  12/16/20 85  11/25/20 99    Wt Readings from Last 3 Encounters:  03/06/21 208 lb 6 oz (94.5 kg)  03/04/21 207 lb 4.8 oz (94 kg)  12/16/20 196 lb 3.2 oz (89 kg)     LABS  reviewed today   Lab Results  Component Value Date   WBC 10.0 03/06/2021   HGB 14.5  03/06/2021   HCT 43.0 03/06/2021   MCV 90.0 03/06/2021   PLT 195 03/06/2021   Lab Results  Component Value Date   CREATININE 1.14 03/06/2021   BUN 17 03/06/2021   NA 141 03/06/2021   K 4.2 03/06/2021   CL 107 03/06/2021   CO2 24 03/06/2021   Lab Results  Component Value Date   ALT 9 11/19/2020   AST 9 11/19/2020   ALKPHOS 77 11/19/2020   BILITOT 0.4 11/19/2020   Lab Results  Component Value Date   CHOL 135 11/19/2020   HDL 30 (L) 11/19/2020   LDLCALC 79 11/19/2020   TRIG 149 11/19/2020   CHOLHDL 4.5 11/19/2020    Lab Results  Component Value Date   HGBA1C 7.3 (H) 11/19/2020   Lab Results  Component Value Date   TSH 1.110 11/19/2020     STUDIES/PROCEDURES reviewed today   Lexiscan MPI 09/25/2020: Pharmacological myocardial perfusion imaging study with no significant ischemia Large perfusion defect in the mid to distal anteroseptal wall, mid to distal inferior wall and apical region Hypokinesis of the region detailed above,, EF estimated at 32% No EKG changes concerning for ischemia at peak stress or in recovery. CT attenuation correction images with moderate aortic atherosclerosis, three-vessel coronary calcification Moderate risk scan given depressed ejection fraction, large defect __________   2D echo 10/08/2020: 1. Left ventricular ejection fraction, by estimation, is 25 to 30%. The  left ventricle has severely decreased function. The left ventricle  demonstrates global hypokinesis. There is mild left ventricular  hypertrophy. Left ventricular diastolic parameters   are indeterminate.   2. Right ventricular systolic function is mildly reduced. The right  ventricular size is normal.   3. The mitral valve is normal in structure. Mild mitral valve  regurgitation.   4. The aortic valve was not well visualized. Aortic valve regurgitation  is not visualized.  __________   LHC 10/20/2020: Conclusions: Severe three-vessel coronary artery disease, including long  segment of diffuse mid LAD disease of 50-90% stenosis with moderate to severe associated calcification and occlusion of branch of D1, small LCx with chronic total occlusion of OM1, and large RCA with 70-90% tandem lesions involving rPDA and 60% rPL stenosis. Patient rPL stent with mild in-stent restenosis (~20%). Upper normal left ventricular  filling pressure (LVEDP ~15 mmHg).   Recommendations: Outpatient cardiac surgery consultation for CABG, given three-vessel coronary artery disease, reduced LVEF, and diabetes mellitus. Continue current heart failure and antianginal regimen. Aggressive secondary prevention. __________   Pre-CABG vascular studies 10/29/2020: Summary:  Right Carotid: Velocities in the right ICA are consistent with a 40-59%                 stenosis.   Left Carotid: Velocities in the left ICA are consistent with a 1-39%  stenosis.  Vertebrals:  Bilateral vertebral arteries demonstrate antegrade flow. Left               vertebral artery demonstrates high resistant flow.  Subclavians: Normal flow hemodynamics were seen in bilateral subclavian               arteries.   Right ABI: Resting right ankle-brachial index is within normal range. No  evidence of significant right lower extremity arterial disease.  Left ABI: Resting left ankle-brachial index is within normal range. No  evidence of significant left lower extremity arterial disease.  Right Upper Extremity: Doppler waveforms remain within normal limits with  right radial compression. Doppler waveform obliterate with right ulnar  compression.  Left Upper Extremity: Doppler waveform obliterate with left radial  compression. Doppler waveforms remain within normal limits with left ulnar  compression.  __________   Intraoperative TEE 10/31/2020: POST-OP IMPRESSIONS  limited Post-CPB examination: The patient separated easily from CPB.  - Left Ventricle: The left ventricle shows improvement in global  contractility.   There is some recovery of contractility in the inferior wall, although it  remains hypokinetic. The septal area remains akinetic. Visually, the EF  has  imroved to approximately 30%.  - Right Ventricle: The right ventricular function appears normal,  unchanged from  pre-bypass images.  - Aortic Valve: The aortic valve function appears normal and unchanged  from  pre-bypass images.  - Mitral Valve: The mitral valve function appears normal, and unchanged  from  pre-bypass images.  - Tricuspid Valve: The tricuspid valve function appears unchanged from  pre-bypass images.     Assessment & Plan    Coronary artery disease s/p PCI s/p CABG --Reports exertional dyspnea, new over the last couple of weeks and concerning for anginal equivalent.  No chest pain.  EKG today shows new TWI and changes as above.  Case discussed with DOD same day.  Given lack of current CP or SOB at the time of his visit, HS Tn deferred.  STAT D-dimer collected, given his recent travel over 30h in a cramped car. If Ddimer elevated, proceed to chest CTA. CBC collected to rule out SOB 2/2/ anemia.  BMET and BNP given volume up with recommendations as below, and as volume could be contributing to his symptoms.  MPI scheduled to rule out cor insufficiency as contributing to his sx.  Continue medical management with ASA 81MG , atorvastatin, Zetia, fenofibrate, Toprol, and as needed SL nitro. If low HR persists at RTC with first-degree AV block and incomplete RBBB with h/o LBBB, and pending monitor results with Zio ordered today, at RTC, consider reducing Toprol.  Continue to follow-up with cardiac rehab.  Return precautions reviewed.  Order echo at RTC as below given EF never rechecked after revascularization. He is agreeable to go to the emergency department if any chest pain or shortness of breath at rest. Addendum: D-dimer elevated with patient instructed to proceed to outpatient imaging for chest CTA, which was subsequently  negative for  pulmonary embolism.  Return precautions again reviewed.    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  Stat D-dimer obtained same day as above and to rule out PE.  10/2020 echo as above with EF 30%.  He does appear volume up with wt 196lbs  208lbs since last visit, which could certainly contribute to his sx, and with recommendation for restart of Lasix 20 mg qd with KCl tab 10 M EQ qd.  Will obtain BMET and BNP today and at follow-up in 1 to 2 weeks.  Continue Entresto, Toprol, Jardiance.  As above, if ongoing bradycardia with first-degree block and history of LBBB with incomplete RBBB today, consider reducing Toprol dose.  Could consider addition of spironolactone at RTC if room in BP/labs allow.  At RTC, order an echo, as he never obtained his post op echo at 3 months post op / post revascularization.  If his EF remains less than 35%, refer to EP for consideration of CRT-D.   Postoperative high-grade AV block/palpitations Incomplete RBBB (new)  History of Left bundle branch block Frst-degree AV block -- No symptoms of syncope.  Patient previously had a left bundle branch block with EKG today showing incomplete right bundle branch block, reviewed with DOD same day, given concern for both left and right bundle branch block on different EKGs.  I elected to order a Zio XT x2 wks.  Check BMP to ensure electrolytes at goal.   Essential hypertension --Continue current medical management as outlined above and with lasix added as above given volume status.  BMET/BNP today and at follow-up.  HLD -- LDL 65 and at goal of below 70.  Tg 149. Recheck triglycerides/LFTs at RTC.  Continue current medical management with statin, fenofibrate, Zetia for now.  DM2 -- A1c 7.3.  Follow-up with PCP as directed. Continue Jardiance.  Medication changes:  --Start Lasix 20 mg daily  --Start potassium 10 M EQ daily Labs ordered:  --BMET, CBC, BNP, D-dimer Studies / Imaging  ordered:  --Lexiscan Myoview --Zio XT x2 weeks --Addendum: chest CTA ordered after Ddimer elevated Future considerations: Pending labs.   --If D-dimer elevated, chest CTA --Order BMET/BNP, Tg/LFTs. --?Addition of spironolactone --Ongoing block with bradycardia, reduce Toprol  --Order ECHO  --?  EP referral if EF remains less than 35% or pending Zio results Disposition:  --RTC after MPI, sooner if needed.  --Return precautions reviewed.  *Please be aware that the above documentation was completed voice recognition software and may contain dictation errors.       Lennon Alstrom, PA-C 03/06/2021

## 2021-03-06 NOTE — Patient Instructions (Addendum)
Medication Instructions:  - Your physician has recommended you make the following change in your medication:   1) START Lasix (furosemide) 20 mg- take 1 tablet by mouth ONCE daily  2) START Potassium 10 meq- take 1 tablet by mouth ONCE daily   *If you need a refill on your cardiac medications before your next appointment, please call your pharmacy*   Lab Work: - Your physician recommends that you have lab work today:  BMP/ BNP/ CBC/ D-Dimer  Nature conservation officer at [facility] 1st desk on the right to check in  If you have labs (blood work) drawn today and your tests are completely normal, you will receive your results only by: [employer] (if you have MyChart) OR A paper copy in the mail If you have any lab test that is abnormal or we need to change your treatment, we will call you to review the results.   Testing/Procedures:  1) Lexiscan Myoview:  - Your physician has requested that you have a lexiscan myoview (chemical stress test).   [place]  Your caregiver has ordered a Stress Test with nuclear imaging. The purpose of this test is to evaluate the blood supply to your heart muscle. This procedure is referred to as a "Non-Invasive Stress Test." This is because other than having an IV started in your vein, nothing is inserted or "invades" your body. Cardiac stress tests are done to find areas of poor blood flow to the heart by determining the extent of coronary artery disease (CAD). Some patients exercise on a treadmill, which naturally increases the blood flow to your heart, while others who are  unable to walk on a treadmill due to physical limitations have a pharmacologic/chemical stress agent called Lexiscan . This medicine will mimic walking on a treadmill by temporarily increasing your coronary blood flow.   Please note: these test may take anywhere between 2-4 hours to complete  PLEASE REPORT TO [place] AT THE FIRST DESK WILL DIRECT  YOU WHERE TO GO  Date of Procedure:_____________________________________  Arrival Time for Procedure:______________________________  Instructions regarding medication:   __x__ : Hold  Lasix (furosemide) the morning of your test  __x__:  You may take all of your other regular medications the morning of your test with enough  water to get them down safely  PLEASE NOTIFY THE OFFICE AT LEAST 24 HOURS IN ADVANCE IF YOU ARE UNABLE TO [place].  [phone] AND  PLEASE NOTIFY NUCLEAR MEDICINE AT [facility] AT LEAST 24 HOURS IN ADVANCE IF YOU ARE UNABLE TO KEEP YOUR APPOINTMENT. [phone]  How to prepare for your Myoview test:  Do not eat or drink after midnight No caffeine for 24 hours prior to test No smoking 24 hours prior to test. Your medication may be taken with water.  If your doctor stopped a medication because of this test, do not take that medication. Ladies, please do not wear dresses.  Skirts or pants are appropriate. Please wear a short sleeve shirt. No perfume, cologne or lotion. Wear comfortable walking shoes. No heels!    2) Heart Monitor-  (to be placed in office after lexiscan)  - Your physician has recommended that you wear a Zio XT (heart) monitor x 14 days  This monitor is a medical device that records the heart's electrical activity. Doctors most often use these monitors to diagnose arrhythmias. Arrhythmias are problems with the speed or rhythm of the heartbeat. The monitor is a small device applied to  your chest. You can wear one while you do your normal daily activities. While wearing this monitor if you have any symptoms to push the button and record what you felt. Once you have worn this monitor for the period of time provider prescribed (Usually 14 days), you will return the monitor device in the postage paid box. Once it is returned they will download the data collected and provide [place] with a report which the provider will then review and we will call you  with those results. Important tips:  Avoid showering during the first 24 hours of wearing the monitor. Avoid excessive sweating to help maximize wear time. Do not submerge the device, no hot tubs, and no swimming pools. Keep any lotions or oils away from the patch. After 24 hours you may shower with the patch on. Take brief showers with your back facing the shower head.  Do not remove patch once it has been placed because that will interrupt data and decrease adhesive wear time. Push the button when you have any symptoms and write down what you were feeling. Once you have completed wearing your monitor, remove and place into box which has postage paid and place in your outgoing mailbox.  If for some reason you have misplaced your box then call our office and we can provide another box and/or mail it off for you.     Follow-Up: At [facility], you and your health needs are our priority.  As part of our continuing mission to provide you with exceptional heart care, we have created designated Provider Care Teams.  These Care Teams include your primary Cardiologist (physician) and Advanced Practice Providers (APPs -  Physician Assistants and Nurse Practitioners) who all work together to provide you with the care you need, when you need it.  We recommend signing up for the patient portal called "MyChart".  Sign up information is provided on this After Visit Summary.  MyChart is used to connect with patients for Virtual Visits (Telemedicine).  Patients are able to view lab/test results, encounter notes, upcoming appointments, etc.  Non-urgent messages can be sent to your provider as well.   To learn more about what you can do with MyChart, go to [email or web address].    Your next appointment:   After Carlton Adam is completed (monitor does not have to be completed)   The format for your next appointment:   In Person  Provider:   You may see [name], MD or one of the following Advanced  Practice Providers on your designated Care Team:   Murray Hodgkins, NP Christell Faith, PA-C Marrianne Mood, PA-C Cadence Kathlen Mody, [place]   Other Instructions  LASIX (Furosemide) Tablets What is this medication? FUROSEMIDE (fyoor [place] se mide) treats high blood pressure. It may also be used to reduce swelling related to heart, kidney, or liver disease. It helps your kidneys remove more fluid and salt from your blood through the urine. It belongs to a group of medications called diuretics. This medicine may be used for other purposes; ask your health care provider or pharmacist if you have questions. COMMON BRAND NAME(S): Active-Medicated Specimen Kit, Delone, Diuscreen, Lasix, RX Specimen Collection Kit, Specimen Collection Kit, URINX Medicated Specimen Collection What should I tell my care team before I take this medication? They need to know if you have any of these conditions: Abnormal blood electrolytes Diarrhea or vomiting Gout Heart disease Kidney disease, small amounts of urine, or difficulty passing urine Liver disease Thyroid disease An unusual or allergic  reaction to furosemide, sulfa medications, other medications, foods, dyes, or preservatives Pregnant or trying to get pregnant Breast-feeding How should I use this medication? Take this medication by mouth. Take it as directed on the prescription label at the same time every day. You can take it with or without food. If it upsets your stomach, take it with food. Keep taking it unless your care team tells you to stop. Talk to your care team about the use of this medication in children. Special care may be needed. Overdosage: If you think you have taken too much of this medicine contact a poison control center or emergency room at once. NOTE: This medicine is only for you. Do not share this medicine with others. What if I miss a dose? If you miss a dose, take it as soon as you can. If it is almost time for your next dose, take only  that dose. Do not take double or extra doses. What may interact with this medication? Aspirin and aspirin-like medications Certain antibiotics Chloral hydrate Cisplatin Cyclosporine Digoxin Diuretics Laxatives Lithium Medications for blood pressure Medications that relax muscles for surgery Methotrexate NSAIDs, medications for pain and inflammation like ibuprofen, naproxen, or indomethacin Phenytoin Steroid medications like prednisone or cortisone Sucralfate Thyroid hormones This list may not describe all possible interactions. Give your health care provider a list of all the medicines, herbs, non-prescription drugs, or dietary supplements you use. Also tell them if you smoke, drink alcohol, or use illegal drugs. Some items may interact with your medicine. What should I watch for while using this medication? Visit your care team for regular checks on your progress. Check your blood pressure regularly. Ask your care team what your blood pressure should be, and when you should contact him or her. If you are a diabetic, check your blood sugar as directed. This medication may cause serious skin reactions. They can happen weeks to months after starting the medication. Contact your care team right away if you notice fevers or flu-like symptoms with a rash. The rash may be red or purple and then turn into blisters or peeling of the skin. Or, you might notice a red rash with swelling of the face, lips or lymph nodes in your neck or under your arms. You may need to be on a special diet while taking this medication. Check with your care team. Also, ask how many glasses of fluid you need to drink a day. You must not get dehydrated. You may get drowsy or dizzy. Do not drive, use machinery, or do anything that needs mental alertness until you know how this medication affects you. Do not stand or sit up quickly, especially if you are an older patient. This reduces the risk of dizzy or fainting spells.  Alcohol can make you more drowsy and dizzy. Avoid alcoholic drinks. This medication can make you more sensitive to the sun. Keep out of the sun. If you cannot avoid being in the sun, wear protective clothing and use sunscreen. Do not use sun lamps or tanning beds/booths. What side effects may I notice from receiving this medication? Side effects that you should report to your care team as soon as possible: Allergic reactions-skin rash, itching, hives, swelling of the face, lips, tongue, or throat Dehydration-increased thirst, dry mouth, feeling faint or lightheaded, headache, dark yellow or brown urine Hearing loss, ringing in ears High blood sugar (hyperglycemia)-increased thirst or amount of urine, unusual weakness or fatigue, blurry vision Low blood pressure-dizziness, feeling faint or lightheaded,  blurry vision Low potassium level-muscle pain or cramps, unusual weakness or fatigue, fast or irregular heartbeat, constipation Side effects that usually do not require medical attention (report to your care team if they continue or are bothersome): Burning or tingling sensation in hands or feet Constipation Diarrhea Dizziness Headache This list may not describe all possible side effects. Call your doctor for medical advice about side effects. You may report side effects to FDA at 1-800-FDA-1088. Where should I keep my medication? Keep out of the reach of children and pets. Store at room temperature between 20 and 25 degrees C (68 and 77 degrees F). Protect from light and moisture. Keep the container tightly closed. Throw away any unused medication after the expiration date. NOTE: This sheet is a summary. It may not cover all possible information. If you have questions about this medicine, talk to your doctor, pharmacist, or health care provider.  2022 Elsevier/Gold Standard (2020-07-15 09:44:48)    Cardiac Nuclear Scan A cardiac nuclear scan is a test that is done to check the flow of blood  to your heart. It is done when you are resting and when you are exercising. The test looks for problems such as: Not enough blood reaching a portion of the heart. The heart muscle not working as it should. You may need this test if: You have heart disease. You have had lab results that are not normal. You have had heart surgery or a balloon procedure to open up blocked arteries (angioplasty). You have chest pain. You have shortness of breath. In this test, a special dye (tracer) is put into your bloodstream. The tracer will travel to your heart. A camera will then take pictures of your heart to see how the tracer moves through your heart. This test is usually done at a hospital and takes 2-4 hours. Tell a doctor about: Any allergies you have. All medicines you are taking, including vitamins, herbs, eye drops, creams, and over-the-counter medicines. Any problems you or family members have had with anesthetic medicines. Any blood disorders you have. Any surgeries you have had. Any medical conditions you have. Whether you are pregnant or may be pregnant. What are the risks? Generally, this is a safe test. However, problems may occur, such as: Serious chest pain and heart attack. This is only a risk if the stress portion of the test is done. Rapid heartbeat. A feeling of warmth in your chest. This feeling usually does not last long. Allergic reaction to the tracer. What happens before the test? Ask your doctor about changing or stopping your normal medicines. This is important. Follow instructions from your doctor about what you cannot eat or drink. Remove your jewelry on the day of the test. What happens during the test? An IV tube will be inserted into one of your veins. Your doctor will give you a small amount of tracer through the IV tube. You will wait for 20-40 minutes while the tracer moves through your bloodstream. Your heart will be monitored with an electrocardiogram (ECG). You  will lie down on an exam table. Pictures of your heart will be taken for about 15-20 minutes. You may also have a stress test. For this test, one of these things may be done: You will be asked to exercise on a treadmill or a stationary bike. You will be given medicines that will make your heart work harder. This is done if you are unable to exercise. When blood flow to your heart has peaked, a tracer will  again be given through the IV tube. After 20-40 minutes, you will get back on the exam table. More pictures will be taken of your heart. Depending on the tracer that is used, more pictures may need to be taken 3-4 hours later. Your IV tube will be removed when the test is over. The test may vary among doctors and hospitals. What happens after the test? Ask your doctor: Whether you can return to your normal schedule, including diet, activities, and medicines. Whether you should drink more fluids. This will help to remove the tracer from your body. Drink enough fluid to keep your pee (urine) pale yellow. Ask your doctor, or the department that is doing the test: When will my results be ready? How will I get my results? Summary A cardiac nuclear scan is a test that is done to check the flow of blood to your heart. Tell your doctor whether you are pregnant or may be pregnant. Before the test, ask your doctor about changing or stopping your normal medicines. This is important. Ask your doctor whether you can return to your normal activities. You may be asked to drink more fluids. This information is not intended to replace advice given to you by your health care provider. Make sure you discuss any questions you have with your health care provider. Document Revised: 08/23/2018 Document Reviewed: 10/17/2017 Elsevier Patient Education  [place].

## 2021-03-06 NOTE — Telephone Encounter (Signed)
Able to reach pt regarding his recent CCTA, Ronald Putnam, PA-C had a chance to review his results and advised   "Good news!  Chest CTA negative for pulmonary embolism.  It does look like he is holding on to fluid --> we restarted lasix today in this setting.  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  Overall reassuring study. No medication changes. "  Ronald Roth very thankful for the phone call of his results, reports results were "fast", will start his lasix as discuss at OV appt this am, otherwise all questions and concerns were address with nothing further at this time. Will see at next schedule f/u appt.

## 2021-03-06 NOTE — Telephone Encounter (Signed)
I spoke with the patient regarding is lab results and the need for STAT CT imaging of his chest.  He is aware we will try to set this up on the outpatient side for today. He advised it will take him ~ 15 minutes to get to the hospital.  He is aware I will run the CT scan through our precert team to see if this requires an authorization, then I will call to schedule.  He is advised to stay close to his phone as I will call him back as soon as possible with a CT time.   The patient voices understanding and is agreeable.

## 2021-03-06 NOTE — Telephone Encounter (Signed)
Lennon Alstrom, PA-C  03/06/2021 10:46 AM EDT Back to Top    Labs show volume up. Renal function stable. Ddimer elevated - checking with imaging to see if can get outpatient CTA versus need to send to the emergency department.  CBC stable

## 2021-03-09 ENCOUNTER — Other Ambulatory Visit: Payer: Self-pay

## 2021-03-09 DIAGNOSIS — Z951 Presence of aortocoronary bypass graft: Secondary | ICD-10-CM

## 2021-03-09 DIAGNOSIS — Z48812 Encounter for surgical aftercare following surgery on the circulatory system: Secondary | ICD-10-CM | POA: Diagnosis not present

## 2021-03-09 NOTE — Progress Notes (Signed)
Daily Session Note  Patient Details  Name: Ronald Roth MRN: [id number] Date of Birth: 02/07/1951 Referring Provider:   Flowsheet Row Cardiac Rehab from 12/16/2020 in [facility] Cardiac and Pulmonary Rehab  Referring Provider [name] MD       Encounter Date: 03/09/2021  Check In:  Session Check In - 03/09/21 1334       Check-In   Supervising physician immediately available to respond to emergencies See telemetry face sheet for immediately available ER MD    Location [facility]-Cardiac & Pulmonary Rehab    Staff Present Birdie Sons, MPA, Nino Glow, MS, ASCM CEP, Exercise Physiologist;Joseph Tessie Fass, [place]    Virtual Visit No    Medication changes reported     Yes    Comments added lasix and potassium    Fall or balance concerns reported    No    Tobacco Cessation No Change    Warm-up and Cool-down Performed on first and last piece of equipment    Resistance Training Performed Yes    VAD Patient? No    PAD/SET Patient? No      Pain Assessment   Currently in Pain? No/denies                Social History   Tobacco Use  Smoking Status Former   Packs/day: 1.00   Years: 30.00   Pack years: 30.00   Types: Cigarettes   Quit date: 04/18/1991   Years since quitting: 29.9  Smokeless Tobacco Never    Goals Met:  Independence with exercise equipment Exercise tolerated well No report of concerns or symptoms today Strength training completed today  Goals Unmet:  Not Applicable  Comments: Pt able to follow exercise prescription today without complaint.  Will continue to monitor for progression.    Dr. Emily Filbert is Medical Director for [place].  Dr. Ottie Glazier is Medical Director for [facility] Pulmonary Rehabilitation.

## 2021-03-11 ENCOUNTER — Other Ambulatory Visit: Payer: Self-pay

## 2021-03-11 DIAGNOSIS — Z951 Presence of aortocoronary bypass graft: Secondary | ICD-10-CM

## 2021-03-11 DIAGNOSIS — Z48812 Encounter for surgical aftercare following surgery on the circulatory system: Secondary | ICD-10-CM | POA: Diagnosis not present

## 2021-03-11 NOTE — Progress Notes (Signed)
Daily Session Note  Patient Details  Name: Ronald Roth MRN: [id number] Date of Birth: [birth year] Referring Provider:   Flowsheet Row Cardiac Rehab from 12/16/2020 in [facility] Cardiac and Pulmonary Rehab  Referring Provider [name] MD       Encounter Date: 03/11/2021  Check In:  Session Check In - 03/11/21 1339       Check-In   Supervising physician immediately available to respond to emergencies See telemetry face sheet for immediately available ER MD    Location [facility]-Cardiac & Pulmonary Rehab    Staff Present Birdie Sons, MPA, Nino Glow, MS, ASCM CEP, Exercise Physiologist;Joseph Tessie Fass, [place]    Virtual Visit No    Medication changes reported     Yes    Comments 67m of lasix; had 3.5 carpules of septocaine and 1:200K epeniphrine during dentist appointment today    Fall or balance concerns reported    No    Tobacco Cessation No Change    Warm-up and Cool-down Performed on first and last piece of equipment    Resistance Training Performed Yes    VAD Patient? No    PAD/SET Patient? No      Pain Assessment   Currently in Pain? No/denies                Social History   Tobacco Use  Smoking Status Former   Packs/day: 1.00   Years: 30.00   Pack years: 30.00   Types: Cigarettes   Quit date: 04/18/1991   Years since quitting: 29.9  Smokeless Tobacco Never    Goals Met:  Independence with exercise equipment Exercise tolerated well No report of concerns or symptoms today Strength training completed today  Goals Unmet:  Not Applicable  Comments: Pt able to follow exercise prescription today without complaint.  Will continue to monitor for progression.    Dr. MEmily Filbertis Medical Director for [place]  Dr. FOttie Glazieris Medical Director for [facility].

## 2021-03-12 NOTE — Patient Instructions (Signed)
Discharge Patient Instructions  Patient Details  Name: Ronald Roth MRN: [id number] Date of Birth: 06-Aug-1950 Referring Provider:  Minna Merritts, MD   Number of Visits: 32  Reason for Discharge:  Patient reached a stable level of exercise. Patient independent in their exercise. Patient has met program and personal goals.  Smoking History:  Social History   Tobacco Use  Smoking Status Former   Packs/day: 1.00   Years: 30.00   Pack years: 30.00   Types: Cigarettes   Quit date: 04/18/1991   Years since quitting: 29.9  Smokeless Tobacco Never    Diagnosis:  S/P CABG x 4  Initial Exercise Prescription:  Initial Exercise Prescription - 12/16/20 1500       Date of Initial Exercise RX and Referring Provider   Date 12/16/20    Referring Provider Rockey Situ, TImothy MD      Treadmill   MPH 2.3    Grade 0.5    Minutes 15    METs 2.92      Recumbant Bike   Level 2    RPM 60    Watts 25    Minutes 15    METs 2.9      NuStep   Level 2    SPM 80    Minutes 15    METs 2.9      REL-XR   Level 1    Speed 50    Minutes 15    METs 2.9      T5 Nustep   Level 1    SPM 80    Minutes 15    METs 2.9      Prescription Details   Frequency (times per week) 3    Duration Progress to 30 minutes of continuous aerobic without signs/symptoms of physical distress      Intensity   THRR 40-80% of Max Heartrate 117-138    Ratings of Perceived Exertion 11-13    Perceived Dyspnea 0-4      Progression   Progression Continue to progress workloads to maintain intensity without signs/symptoms of physical distress.      Resistance Training   Training Prescription Yes    Weight 4 lb    Reps 10-15             Discharge Exercise Prescription (Final Exercise Prescription Changes):  Exercise Prescription Changes - 03/02/21 1000       Response to Exercise   Blood Pressure (Admit) 124/82    Blood Pressure (Exercise) 142/64    Blood Pressure (Exit) 124/74    Heart  Rate (Admit) 44 bpm    Heart Rate (Exercise) 96 bpm    Heart Rate (Exit) 71 bpm    Rating of Perceived Exertion (Exercise) 13    Symptoms fatigued    Duration Continue with 30 min of aerobic exercise without signs/symptoms of physical distress.    Intensity THRR unchanged      Progression   Progression Continue to progress workloads to maintain intensity without signs/symptoms of physical distress.    Average METs 4.3      Resistance Training   Training Prescription Yes    Weight 5 lb    Reps 10-15      Interval Training   Interval Training No      Treadmill   MPH 3.2    Grade 3.5    Minutes 15    METs 4.99      Recumbant Elliptical   Level 4    Minutes 15  METs 4      Home Exercise Plan   Plans to continue exercise at Home (comment)   walking, staff videos   Frequency Add 2 additional days to program exercise sessions.    Initial Home Exercises Provided 12/24/20      Oxygen   Maintain Oxygen Saturation 88% or higher             Functional Capacity:  6 Minute Walk     Row Name 12/16/20 1530 03/04/21 1406       6 Minute Walk   Phase Initial Discharge    Distance 1225 feet 1290 feet    Distance % Change -- 5.3 %    Distance Feet Change -- 65 ft    Walk Time 6 minutes 6 minutes    # of Rest Breaks 0 0    MPH 2.32 2.44    METS 2.9 2.76    RPE 12 12    Perceived Dyspnea  0 2    VO2 Peak 10.15 9.66    Symptoms No Yes (comment)    Comments -- knee pain 5/10, SOB    Resting HR 96 bpm 75 bpm    Resting BP 134/68 114/66    Resting Oxygen Saturation  96 % --    Exercise Oxygen Saturation  during 6 min walk 97 % --    Max Ex. HR 106 bpm 77 bpm    Max Ex. BP 140/64 166/74    2 Minute Post BP 122/68 --              Nutrition & Weight - Outcomes:  Pre Biometrics - 12/16/20 1522       Pre Biometrics   Height 5' 10.75" (1.797 m)    Weight 196 lb 3.2 oz (89 kg)    BMI (Calculated) 27.56    Single Leg Stand 6.22 seconds             Post  Biometrics - 03/04/21 1407        Post  Biometrics   Height 5' 10.75" (1.797 m)    Weight 207 lb 4.8 oz (94 kg)    BMI (Calculated) 29.12    Single Leg Stand 4.7 seconds             Nutrition:  Nutrition Therapy & Goals - 12/31/20 1610       Nutrition Therapy   Diet Heart healthy, low Na, diabetes friendly    Drug/Food Interactions Statins/Certain Fruits    Protein (specify units) 70g    Fiber 30 grams    Whole Grain Foods 3 servings    Saturated Fats 12 max. grams    Fruits and Vegetables 8 servings/day    Sodium 1.5 grams      Personal Nutrition Goals   Nutrition Goal ST: try out different recipes, focu on MyPlate LT: become more confident in meal planning    Comments B: piece of toast with butter to let him take medications (whole grain thin bread) with coffee ( creamer no sugar) L: hotdog and some chips or [place] and cheese sandwich with mayo D: portions have reduced. Vegetable with a meat (grill, baked) - eats chicken more often. He reports liking his non-starchy vegetables S: lite yogurt with granola. Drinks: water with crystal lite. He liked to have cookies, but he stopped. He would like to focus on meal planning. He was thinking about having some healthy choice frozen meals just to have on hand - discussed quick meals like  summer rolls with pre-cut vegetables, protein, and dip - this can easily put together - he was interested in trying that. Discussed heart healthy eating and diabetes friendly eating.      Intervention Plan   Intervention Prescribe, educate and counsel regarding individualized specific dietary modifications aiming towards targeted core components such as weight, hypertension, lipid management, diabetes, heart failure and other comorbidities.;Nutrition handout(s) given to patient.    Expected Outcomes Short Term Goal: Understand basic principles of dietary content, such as calories, fat, sodium, cholesterol and nutrients.;Short Term Goal: A plan has been  developed with personal nutrition goals set during dietitian appointment.;Long Term Goal: Adherence to prescribed nutrition plan.             Goals reviewed with patient; copy given to patient. 

## 2021-03-13 ENCOUNTER — Ambulatory Visit
Admission: RE | Admit: 2021-03-13 | Discharge: 2021-03-13 | Disposition: A | Discharge status: Home / Self Care | Payer: Managed Care, Other (non HMO) | Source: Ambulatory Visit | Attending: Physician Assistant | Admitting: Physician Assistant

## 2021-03-13 ENCOUNTER — Ambulatory Visit (INDEPENDENT_AMBULATORY_CARE_PROVIDER_SITE_OTHER): Payer: Managed Care, Other (non HMO)

## 2021-03-13 ENCOUNTER — Other Ambulatory Visit: Payer: Self-pay

## 2021-03-13 DIAGNOSIS — I442 Atrioventricular block, complete: Secondary | ICD-10-CM

## 2021-03-13 DIAGNOSIS — R002 Palpitations: Secondary | ICD-10-CM

## 2021-03-13 DIAGNOSIS — R0602 Shortness of breath: Secondary | ICD-10-CM | POA: Diagnosis present

## 2021-03-13 MED ORDER — REGADENOSON 0.4 MG/5ML IV SOLN
0.4000 mg | Freq: Once | INTRAVENOUS | Status: AC
  Administered 2021-03-13: 0.4 mg via INTRAVENOUS

## 2021-03-13 MED ORDER — TECHNETIUM TC 99M TETROFOSMIN IV KIT
10.0400 | PACK | Freq: Once | INTRAVENOUS | Status: AC | PRN
  Administered 2021-03-13: 10.04 via INTRAVENOUS

## 2021-03-13 MED ORDER — TECHNETIUM TC 99M TETROFOSMIN IV KIT
31.0000 | PACK | Freq: Once | INTRAVENOUS | Status: AC | PRN
  Administered 2021-03-13: 31 via INTRAVENOUS

## 2021-03-13 NOTE — Progress Notes (Unsigned)
Monitor

## 2021-03-16 ENCOUNTER — Other Ambulatory Visit: Payer: Self-pay

## 2021-03-16 ENCOUNTER — Ambulatory Visit: Payer: Medicare Other | Admitting: Family Medicine

## 2021-03-16 DIAGNOSIS — Z951 Presence of aortocoronary bypass graft: Secondary | ICD-10-CM

## 2021-03-16 DIAGNOSIS — Z48812 Encounter for surgical aftercare following surgery on the circulatory system: Secondary | ICD-10-CM | POA: Diagnosis not present

## 2021-03-16 LAB — NM MYOCAR MULTI W/SPECT W/WALL MOTION / EF
LV dias vol: 134 mL (ref 62–150)
LV sys vol: 72 mL
Nuc Stress EF: 46 %
Peak HR: 56 {beats}/min
Percent HR: 37 %
Rest HR: 46 {beats}/min
Rest Nuclear Isotope Dose: 10 mCi
SDS: 0
SRS: 4
SSS: 4
ST Depression (mm): 0 mm
Stress Nuclear Isotope Dose: 31 mCi
TID: 0.93

## 2021-03-16 NOTE — Progress Notes (Signed)
Daily Session Note  Patient Details  Name: Ronald Roth MRN: [id number] Date of Birth: 1951/02/24 Referring Provider:   Flowsheet Row Cardiac Rehab from 12/16/2020 in [facility]  Referring Provider [name] MD       Encounter Date: 03/16/2021  Check In:  Session Check In - 03/16/21 1341       Check-In   Supervising physician immediately available to respond to emergencies See telemetry face sheet for immediately available ER MD    Location [facility]    Staff Present Birdie Sons, MPA, Nino Glow, MS, ASCM CEP, Exercise Physiologist;Joseph Tessie Fass, [place]    Virtual Visit No    Medication changes reported     No    Fall or balance concerns reported    No    Tobacco Cessation No Change    Warm-up and Cool-down Performed on first and last piece of equipment    Resistance Training Performed Yes    VAD Patient? No    PAD/SET Patient? No      Pain Assessment   Currently in Pain? No/denies                Social History   Tobacco Use  Smoking Status Former   Packs/day: 1.00   Years: 30.00   Pack years: 30.00   Types: Cigarettes   Quit date: 04/18/1991   Years since quitting: 29.9  Smokeless Tobacco Never    Goals Met:  Independence with exercise equipment Exercise tolerated well No report of concerns or symptoms today Strength training completed today  Goals Unmet:  Not Applicable  Comments: Pt able to follow exercise prescription today without complaint.  Will continue to monitor for progression.    Dr. Emily Filbert is Medical Director for [place].  Dr. Ottie Glazier is Medical Director for [facility].

## 2021-03-18 ENCOUNTER — Other Ambulatory Visit: Payer: Self-pay

## 2021-03-18 ENCOUNTER — Other Ambulatory Visit: Payer: Self-pay | Admitting: Family Medicine

## 2021-03-18 ENCOUNTER — Encounter: Payer: Managed Care, Other (non HMO) | Attending: Cardiovascular Disease | Admitting: *Deleted

## 2021-03-18 ENCOUNTER — Encounter: Payer: Self-pay | Admitting: Family Medicine

## 2021-03-18 ENCOUNTER — Encounter: Payer: Self-pay | Admitting: *Deleted

## 2021-03-18 DIAGNOSIS — Z951 Presence of aortocoronary bypass graft: Secondary | ICD-10-CM

## 2021-03-18 DIAGNOSIS — E1140 Type 2 diabetes mellitus with diabetic neuropathy, unspecified: Secondary | ICD-10-CM

## 2021-03-18 MED ORDER — JARDIANCE 25 MG PO TABS: 25.0000 mg | ORAL_TABLET | Freq: Every day | ORAL | 3 refills | Status: DC

## 2021-03-18 NOTE — Progress Notes (Signed)
Cardiac Individual Treatment Plan  Patient Details  Name: Crue Otero MRN: [id number] Date of Birth: Nov 11, 1950 Referring Provider:   Flowsheet Row Cardiac Rehab from 12/16/2020 in [facility] Cardiac and Pulmonary Rehab  Referring Provider [name] MD       Initial Encounter Date:  Flowsheet Row Cardiac Rehab from 12/16/2020 in [facility] Cardiac and Pulmonary Rehab  Date 12/16/20       Visit Diagnosis: S/P CABG x 4  Patient's Home Medications on Admission:  Current Outpatient Medications:    acetaminophen (TYLENOL) 500 MG tablet, Take 2 tablets (1,000 mg total) by mouth every 6 (six) hours as needed., Disp: 30 tablet, Rfl: 0   aspirin EC 81 MG tablet, Take 81 mg by mouth in the morning. Swallow whole., Disp: , Rfl:    atorvastatin (LIPITOR) 80 MG tablet, Take 1 tablet (80 mg total) by mouth in the morning. Take 80 mg by mouth in the morning., Disp: 90 tablet, Rfl: 3   diphenhydrAMINE (BENADRYL) 25 MG tablet, Take 75 mg by mouth at bedtime., Disp: , Rfl:    ezetimibe (ZETIA) 10 MG tablet, Take 1 tablet (10 mg total) by mouth in the morning., Disp: 90 tablet, Rfl: 3   fenofibrate (TRICOR) 145 MG tablet, Take 145 mg by mouth in the morning., Disp: , Rfl:    furosemide (LASIX) 20 MG tablet, Take 1 tablet (20 mg total) by mouth daily., Disp: 30 tablet, Rfl: 3   JARDIANCE 25 MG TABS tablet, Take 1 tablet (25 mg total) by mouth daily before breakfast., Disp: 90 tablet, Rfl: 3   metoprolol succinate (TOPROL-XL) 50 MG 24 hr tablet, Take 1 tablet (50 mg total) by mouth daily. Take with or immediately following a meal., Disp: 90 tablet, Rfl: 3   nitroGLYCERIN (NITROSTAT) 0.4 MG SL tablet, Place 1 tablet (0.4 mg total) under the tongue every 5 (five) minutes x 3 doses as needed for chest pain., Disp: 25 tablet, Rfl: 3   omeprazole (PRILOSEC) 20 MG capsule, Take 20 mg by mouth in the morning., Disp: , Rfl:    OZEMPIC, 0.25 OR 0.5 MG/DOSE, 2 MG/1.5ML SOPN, INJECT SUBCUTANEOUSLY 0.5MG ONCE WEEKLY, Disp:  4.5 mL, Rfl: 0   potassium chloride (KLOR-CON) 10 MEQ tablet, Take 1 tablet (10 mEq total) by mouth daily., Disp: 30 tablet, Rfl: 3   sacubitril-valsartan (ENTRESTO) 24-26 MG, Take 1 tablet by mouth 2 (two) times daily., Disp: 60 tablet, Rfl: 11   Vitamin D, Ergocalciferol, (DRISDOL) 1.25 MG (50000 UNIT) CAPS capsule, Take 50,000 Units by mouth 2 (two) times a week. Sundays & Thursdays, Disp: , Rfl:   Past Medical History: Past Medical History:  Diagnosis Date   Anxiety    Coronary artery disease    Diabetes mellitus without complication ([place])    GERD (gastroesophageal reflux disease)    Heart attack ([place]) 2012   Hypertension    Sciatica     Tobacco Use: Social History   Tobacco Use  Smoking Status Former   Packs/day: 1.00   Years: 30.00   Pack years: 30.00   Types: Cigarettes   Quit date: 04/18/1991   Years since quitting: 29.9  Smokeless Tobacco Never    Labs: Recent Review Flowsheet Data     Labs for ITP Cardiac and Pulmonary Rehab Latest Ref Rng & Units 10/31/2020 10/31/2020 10/31/2020 10/31/2020 11/19/2020   Cholestrol 100 - 199 mg/dL - - - - 135   LDLCALC 0 - 99 mg/dL - - - - 79   HDL >39 mg/dL - - - -  30(L)   Trlycerides 0 - 149 mg/dL - - - - 149   Hemoglobin A1c 4.8 - 5.6 % - - - - 7.3(H)   PHART 7.350 - 7.450 - 7.352 7.343(L) 7.303(L) -   PCO2ART 32.0 - 48.0 mmHg - 43.0 44.9 43.8 -   HCO3 20.0 - 28.0 mmol/L - 24.4 24.4 21.6 -   TCO2 22 - 32 mmol/L 23 26 26 23  -   ACIDBASEDEF 0.0 - 2.0 mmol/L - 2.0 2.0 5.0(H) -   O2SAT % - 95.0 98.0 97.0 -        Exercise Target Goals: Exercise Program Goal: Individual exercise prescription set using results from initial 6 min walk test and THRR while considering  patient's activity barriers and safety.   Exercise Prescription Goal: Initial exercise prescription builds to 30-45 minutes a day of aerobic activity, 2-3 days per week.  Home exercise guidelines will be given to patient during program as part of exercise  prescription that the participant will acknowledge.   Education: Aerobic Exercise: - Group verbal and visual presentation on the components of exercise prescription. Introduces F.I.T.T principle from ACSM for exercise prescriptions.  Reviews F.I.T.T. principles of aerobic exercise including progression. Written material given at graduation. Flowsheet Row Cardiac Rehab from 03/04/2021 in [facility] Cardiac and Pulmonary Rehab  Date 01/07/21  Educator AS  Instruction Review Code 1- Verbalizes Understanding       Education: Resistance Exercise: - Group verbal and visual presentation on the components of exercise prescription. Introduces F.I.T.T principle from ACSM for exercise prescriptions  Reviews F.I.T.T. principles of resistance exercise including progression. Written material given at graduation. Flowsheet Row Cardiac Rehab from 03/04/2021 in [facility] Cardiac and Pulmonary Rehab  Date 01/14/21  Educator AS  Instruction Review Code 1- Verbalizes Understanding        Education: Exercise & Equipment Safety: - Individual verbal instruction and demonstration of equipment use and safety with use of the equipment. Flowsheet Row Cardiac Rehab from 03/04/2021 in [facility] Cardiac and Pulmonary Rehab  Education need identified 12/16/20  Date 12/16/20  Educator [place]  Instruction Review Code 1- Verbalizes Understanding       Education: Exercise Physiology & General Exercise Guidelines: - Group verbal and written instruction with models to review the exercise physiology of the cardiovascular system and associated critical values. Provides general exercise guidelines with specific guidelines to those with heart or lung disease.  Flowsheet Row Cardiac Rehab from 03/04/2021 in [facility] Cardiac and Pulmonary Rehab  Date 12/31/20  Educator AS  Instruction Review Code 1- Verbalizes Understanding       Education: Flexibility, Balance, Mind/Body Relaxation: - Group verbal and visual presentation with  interactive activity on the components of exercise prescription. Introduces F.I.T.T principle from ACSM for exercise prescriptions. Reviews F.I.T.T. principles of flexibility and balance exercise training including progression. Also discusses the mind body connection.  Reviews various relaxation techniques to help reduce and manage stress (i.e. Deep breathing, progressive muscle relaxation, and visualization). Balance handout provided to take home. Written material given at graduation. Flowsheet Row Cardiac Rehab from 03/04/2021 in [facility] Cardiac and Pulmonary Rehab  Date 01/21/21  Educator AS  Instruction Review Code 1- Verbalizes Understanding       Activity Barriers & Risk Stratification:  Activity Barriers & Cardiac Risk Stratification - 12/16/20 1523       Activity Barriers & Cardiac Risk Stratification   Activity Barriers Joint Problems;Balance Concerns;Decreased Ventricular Function;Other (comment);Deconditioning    Comments Limited ROM- right shoulder    Cardiac Risk Stratification  High             6 Minute Walk:  6 Minute Walk     Row Name 12/16/20 1530 03/04/21 1406       6 Minute Walk   Phase Initial Discharge    Distance 1225 feet 1290 feet    Distance % Change -- 5.3 %    Distance Feet Change -- 65 ft    Walk Time 6 minutes 6 minutes    # of Rest Breaks 0 0    MPH 2.32 2.44    METS 2.9 2.76    RPE 12 12    Perceived Dyspnea  0 2    VO2 Peak 10.15 9.66    Symptoms No Yes (comment)    Comments -- knee pain 5/10, SOB    Resting HR 96 bpm 75 bpm    Resting BP 134/68 114/66    Resting Oxygen Saturation  96 % --    Exercise Oxygen Saturation  during 6 min walk 97 % --    Max Ex. HR 106 bpm 77 bpm    Max Ex. BP 140/64 166/74    2 Minute Post BP 122/68 --             Oxygen Initial Assessment:   Oxygen Re-Evaluation:   Oxygen Discharge (Final Oxygen Re-Evaluation):   Initial Exercise Prescription:  Initial Exercise Prescription - 12/16/20 1500        Date of Initial Exercise RX and Referring Provider   Date 12/16/20    Referring Provider Rockey Situ, TImothy MD      Treadmill   MPH 2.3    Grade 0.5    Minutes 15    METs 2.92      Recumbant Bike   Level 2    RPM 60    Watts 25    Minutes 15    METs 2.9      NuStep   Level 2    SPM 80    Minutes 15    METs 2.9      REL-XR   Level 1    Speed 50    Minutes 15    METs 2.9      T5 Nustep   Level 1    SPM 80    Minutes 15    METs 2.9      Prescription Details   Frequency (times per week) 3    Duration Progress to 30 minutes of continuous aerobic without signs/symptoms of physical distress      Intensity   THRR 40-80% of Max Heartrate 117-138    Ratings of Perceived Exertion 11-13    Perceived Dyspnea 0-4      Progression   Progression Continue to progress workloads to maintain intensity without signs/symptoms of physical distress.      Resistance Training   Training Prescription Yes    Weight 4 lb    Reps 10-15             Perform Capillary Blood Glucose checks as needed.  Exercise Prescription Changes:   Exercise Prescription Changes     Row Name 12/22/20 1200 12/24/20 1400 01/05/21 1000 01/20/21 1300 02/02/21 1300     Response to Exercise   Blood Pressure (Admit) 142/74 -- 128/60 122/64 124/70   Blood Pressure (Exercise) 138/64 -- 128/58 -- --   Blood Pressure (Exit) 128/62 -- 118/60 104/64 106/64   Heart Rate (Admit) 79 bpm -- 82 bpm 106 bpm 89 bpm  Heart Rate (Exercise) 111 bpm -- 109 bpm 119 bpm 125 bpm   Heart Rate (Exit) 101 bpm -- 88 bpm 96 bpm 91 bpm   Rating of Perceived Exertion (Exercise) 12 -- 12 13 13    Symptoms -- -- none none none   Comments second day exercise -- -- -- --   Duration Progress to 30 minutes of  aerobic without signs/symptoms of physical distress -- Continue with 30 min of aerobic exercise without signs/symptoms of physical distress. Continue with 30 min of aerobic exercise without signs/symptoms of physical  distress. Continue with 30 min of aerobic exercise without signs/symptoms of physical distress.   Intensity THRR unchanged -- THRR unchanged THRR unchanged THRR unchanged     Progression   Progression Continue to progress workloads to maintain intensity without signs/symptoms of physical distress. -- Continue to progress workloads to maintain intensity without signs/symptoms of physical distress. Continue to progress workloads to maintain intensity without signs/symptoms of physical distress. Continue to progress workloads to maintain intensity without signs/symptoms of physical distress.   Average METs 2.93 -- 3.31 3.7 5.12     Resistance Training   Training Prescription Yes -- Yes Yes Yes   Weight 4 lb -- 4 lb 4 lb 5 lb   Reps 10-15 -- 10-15 10-15 10-15     Interval Training   Interval Training -- -- No No No     Treadmill   MPH 2.5 -- 2.8 2.8 3   Grade 1 -- 0.5 3.5 3.5   Minutes 15 -- 15 15 15    METs 3.26 -- 3.26 4.49 4.75     Recumbant Bike   Level -- -- -- 6 --   RPM -- -- -- 60 --   Minutes -- -- -- 15 --   METs -- -- -- 2.88 --     Recumbant Elliptical   Level -- -- -- -- 5   Minutes -- -- -- -- 15   METs -- -- -- -- 4.2     REL-XR   Level 3 -- 5 -- 7   Speed 50 -- -- -- --   Minutes 15 -- 15 -- 15   METs 2.6 -- 4.7 -- 6.4     T5 Nustep   Level -- -- 1 -- --   Minutes -- -- 15 -- --   METs -- -- 2.2 -- --     Home Exercise Plan   Plans to continue exercise at -- Home (comment)  walking, staff videos Home (comment)  walking, staff videos Home (comment)  walking, staff videos Home (comment)  walking, staff videos   Frequency -- Add 2 additional days to program exercise sessions. Add 2 additional days to program exercise sessions. Add 2 additional days to program exercise sessions. Add 2 additional days to program exercise sessions.   Initial Home Exercises Provided -- 12/24/20 12/24/20 12/24/20 12/24/20     Oxygen   Maintain Oxygen Saturation -- -- -- -- 88% or  higher    Row Name 02/16/21 1500 03/02/21 1000 03/16/21 1600         Response to Exercise   Blood Pressure (Admit) 112/60 124/82 162/80     Blood Pressure (Exercise) -- 142/64 --     Blood Pressure (Exit) 102/58 124/74 158/76     Heart Rate (Admit) 94 bpm 44 bpm 73 bpm     Heart Rate (Exercise) 119 bpm 96 bpm 104 bpm     Heart Rate (Exit) 96 bpm 71 bpm  49 bpm     Rating of Perceived Exertion (Exercise) 13 13 13      Symptoms none fatigued --     Duration Continue with 30 min of aerobic exercise without signs/symptoms of physical distress. Continue with 30 min of aerobic exercise without signs/symptoms of physical distress. Continue with 30 min of aerobic exercise without signs/symptoms of physical distress.     Intensity THRR unchanged THRR unchanged THRR unchanged       Progression   Progression Continue to progress workloads to maintain intensity without signs/symptoms of physical distress. Continue to progress workloads to maintain intensity without signs/symptoms of physical distress. Continue to progress workloads to maintain intensity without signs/symptoms of physical distress.     Average METs 4.4 4.3 4.5       Resistance Training   Training Prescription Yes Yes Yes     Weight 5 lb 5 lb 5 lb     Reps 10-15 10-15 10-15       Interval Training   Interval Training No No No       Treadmill   MPH 3.2 3.2 2.7     Grade 3.5 3.5 0     Minutes 15 15 15      METs 4.99 4.99 3.01       NuStep   Level 6 -- --     Minutes 15 -- --     METs 3.8 -- --       Recumbant Elliptical   Level -- 4 --     Minutes -- 15 --     METs -- 4 --       REL-XR   Level -- -- 7     Minutes -- -- 15     METs -- -- 6       Home Exercise Plan   Plans to continue exercise at Home (comment)  walking, staff videos Home (comment)  walking, staff videos Home (comment)  walking, staff videos     Frequency Add 2 additional days to program exercise sessions. Add 2 additional days to program exercise  sessions. Add 2 additional days to program exercise sessions.     Initial Home Exercises Provided 12/24/20 12/24/20 12/24/20       Oxygen   Maintain Oxygen Saturation 88% or higher 88% or higher 88% or higher              Exercise Comments:   Exercise Comments     Row Name 03/02/21 1342           Exercise Comments Has doctor appointment Friday, 10/21, concerning fluctuating heart rate and change in rhythm. Continues to be asymptomatic.                Exercise Goals and Review:   Exercise Goals     Row Name 12/16/20 1545             Exercise Goals   Increase Physical Activity Yes       Intervention Provide advice, education, support and counseling about physical activity/exercise needs.;Develop an individualized exercise prescription for aerobic and resistive training based on initial evaluation findings, risk stratification, comorbidities and participant's personal goals.       Expected Outcomes Short Term: Attend rehab on a regular basis to increase amount of physical activity.;Long Term: Add in home exercise to make exercise part of routine and to increase amount of physical activity.;Long Term: Exercising regularly at least 3-5 days a week.       Increase  Strength and Stamina Yes       Intervention Provide advice, education, support and counseling about physical activity/exercise needs.;Develop an individualized exercise prescription for aerobic and resistive training based on initial evaluation findings, risk stratification, comorbidities and participant's personal goals.       Expected Outcomes Short Term: Increase workloads from initial exercise prescription for resistance, speed, and METs.;Short Term: Perform resistance training exercises routinely during rehab and add in resistance training at home;Long Term: Improve cardiorespiratory fitness, muscular endurance and strength as measured by increased METs and functional capacity (6MWT)       Able to understand and  use rate of perceived exertion (RPE) scale Yes       Intervention Provide education and explanation on how to use RPE scale       Expected Outcomes Short Term: Able to use RPE daily in rehab to express subjective intensity level;Long Term:  Able to use RPE to guide intensity level when exercising independently       Able to understand and use Dyspnea scale Yes       Intervention Provide education and explanation on how to use Dyspnea scale       Expected Outcomes Short Term: Able to use Dyspnea scale daily in rehab to express subjective sense of shortness of breath during exertion;Long Term: Able to use Dyspnea scale to guide intensity level when exercising independently       Knowledge and understanding of Target Heart Rate Range (THRR) Yes       Intervention Provide education and explanation of THRR including how the numbers were predicted and where they are located for reference       Expected Outcomes Short Term: Able to state/look up THRR;Long Term: Able to use THRR to govern intensity when exercising independently;Short Term: Able to use daily as guideline for intensity in rehab       Able to check pulse independently Yes       Intervention Provide education and demonstration on how to check pulse in carotid and radial arteries.;Review the importance of being able to check your own pulse for safety during independent exercise       Expected Outcomes Short Term: Able to explain why pulse checking is important during independent exercise;Long Term: Able to check pulse independently and accurately       Understanding of Exercise Prescription Yes       Intervention Provide education, explanation, and written materials on patient's individual exercise prescription       Expected Outcomes Short Term: Able to explain program exercise prescription;Long Term: Able to explain home exercise prescription to exercise independently                Exercise Goals Re-Evaluation :  Exercise Goals  Re-Evaluation     Row Name 12/17/20 1525 12/22/20 1301 12/24/20 1410 01/05/21 1020 01/08/21 1333     Exercise Goal Re-Evaluation   Exercise Goals Review Increase Physical Activity;Increase Strength and Stamina;Able to understand and use rate of perceived exertion (RPE) scale;Knowledge and understanding of Target Heart Rate Range (THRR);Able to check pulse independently;Understanding of Exercise Prescription Increase Physical Activity;Increase Strength and Stamina Increase Physical Activity;Increase Strength and Stamina;Able to understand and use rate of perceived exertion (RPE) scale;Knowledge and understanding of Target Heart Rate Range (THRR);Understanding of Exercise Prescription;Able to understand and use Dyspnea scale;Able to check pulse independently Increase Physical Activity;Increase Strength and Stamina Increase Physical Activity;Increase Strength and Stamina;Understanding of Exercise Prescription   Comments Reviewed RPE and dyspnea scales, THR and  program prescription with pt today.  Pt voiced understanding and was given a copy of goals to take home. Mikki Santee has done well in his first days.  He has increased levels on TM and XR.  Staff will monitor progress. Reviewed home exercise with pt today.  Pt plans to walking with dog and staff videos at home for exercise.  Reviewed THR, pulse, RPE, sign and symptoms, pulse oximetery and when to call 911 or MD.  Also discussed weather considerations and indoor options.  Pt voiced understanding. Mikki Santee is doing well in rehab. He has already increased to level 5 on the XR and increased to a speed of 2.8 on the treadmill. We will continue to monitor Mikki Santee is doing well in rehab.  He is feeling good and notices a difference in how he is feeling. He is doing his home exercise and walking with the dog. He is doing his balance exercises.   Expected Outcomes Short: Use RPE daily to regulate intensity. Long: Follow program prescription in THR. Short:  attend consistently Long:  build overall stamina Short: Start to walk more on off days Long: Improve stamina Short: Increase incline on treadmill Long: Continue to increase overall MET level Short: Continue to add in more walking at home.  Long: Continue improve stamina    Row Name 01/20/21 1334 02/02/21 1319 02/16/21 1503 03/02/21 1023 03/16/21 1635     Exercise Goal Re-Evaluation   Exercise Goals Review Increase Physical Activity;Increase Strength and Stamina Increase Physical Activity;Increase Strength and Stamina;Understanding of Exercise Prescription Increase Physical Activity;Increase Strength and Stamina Increase Physical Activity;Increase Strength and Stamina Increase Physical Activity;Increase Strength and Stamina   Comments Mikki Santee continues to do well and has increased average MET level.  Staff will continue to monitor progress. Mikki Santee has been doing well in rehab.  He is on level 5 for the recumbent elliptical and level 7 on the XR.  He is feeling like his strength and stamina is getting better and has noticed that some days are better than others.  He was able to cut grass before class today.  He is also working on balance and stretching at home daily.  He has also been walking some days and plans to join a gym after graduation to continue to use treadmill. We will continue to monitor his progress. Mikki Santee has average MET level of 4.4.  He is progressing well with execise and reaches target HR range.  We will continue to monitor progress. Mikki Santee returned back to rehab after being on vacation for a week and had reported a lower HR, feeling fatigued and RN noted rhythm change on EKG strips upon return. Patient supposed to follow up with Dr. Rockey Situ after review of strips. He tolerated exercise during the session he was here. We will continue to monitor based on doctor's recommendations. Mikki Santee will graduate HT this week.  He did improve post 6 min walk.  He plans to exercise at home upon graduation.   Expected Outcomes Short: contineu to  attend consistently Long: build overall stamina Short: Try 6 lb hand weights LOng; continue to improve stamina Short: try 6 lb hand weights Long: build overall stamina Short: Follow up with doctor and continue exercise prescription Long: Continue to increase overall MET level ST/LT: complete HT and maintain exercise on his own            Discharge Exercise Prescription (Final Exercise Prescription Changes):  Exercise Prescription Changes - 03/16/21 1600       Response to Exercise  Blood Pressure (Admit) 162/80    Blood Pressure (Exit) 158/76    Heart Rate (Admit) 73 bpm    Heart Rate (Exercise) 104 bpm    Heart Rate (Exit) 49 bpm    Rating of Perceived Exertion (Exercise) 13    Duration Continue with 30 min of aerobic exercise without signs/symptoms of physical distress.    Intensity THRR unchanged      Progression   Progression Continue to progress workloads to maintain intensity without signs/symptoms of physical distress.    Average METs 4.5      Resistance Training   Training Prescription Yes    Weight 5 lb    Reps 10-15      Interval Training   Interval Training No      Treadmill   MPH 2.7    Grade 0    Minutes 15    METs 3.01      REL-XR   Level 7    Minutes 15    METs 6      Home Exercise Plan   Plans to continue exercise at Home (comment)   walking, staff videos   Frequency Add 2 additional days to program exercise sessions.    Initial Home Exercises Provided 12/24/20      Oxygen   Maintain Oxygen Saturation 88% or higher             Nutrition:  Target Goals: Understanding of nutrition guidelines, daily intake of sodium <1572m, cholesterol <2059m calories 30% from fat and 7% or less from saturated fats, daily to have 5 or more servings of fruits and vegetables.  Education: All About Nutrition: -Group instruction provided by verbal, written material, interactive activities, discussions, models, and posters to present general guidelines for  heart healthy nutrition including fat, fiber, MyPlate, the role of sodium in heart healthy nutrition, utilization of the nutrition label, and utilization of this knowledge for meal planning. Follow up email sent as well. Written material given at graduation.   Biometrics:  Pre Biometrics - 12/16/20 1522       Pre Biometrics   Height 5' 10.75" (1.797 m)    Weight 196 lb 3.2 oz (89 kg)    BMI (Calculated) 27.56    Single Leg Stand 6.22 seconds             Post Biometrics - 03/04/21 1407        Post  Biometrics   Height 5' 10.75" (1.797 m)    Weight 207 lb 4.8 oz (94 kg)    BMI (Calculated) 29.12    Single Leg Stand 4.7 seconds             Nutrition Therapy Plan and Nutrition Goals:  Nutrition Therapy & Goals - 12/31/20 1610       Nutrition Therapy   Diet Heart healthy, low Na, diabetes friendly    Drug/Food Interactions Statins/Certain Fruits    Protein (specify units) 70g    Fiber 30 grams    Whole Grain Foods 3 servings    Saturated Fats 12 max. grams    Fruits and Vegetables 8 servings/day    Sodium 1.5 grams      Personal Nutrition Goals   Nutrition Goal ST: try out different recipes, focu on MyPlate LT: become more confident in meal planning    Comments B: piece of toast with butter to let him take medications (whole grain thin bread) with coffee ( creamer no sugar) L: hotdog and some chips or tu[place]nd cheese  sandwich with mayo D: portions have reduced. Vegetable with a meat (grill, baked) - eats chicken more often. He reports liking his non-starchy vegetables S: lite yogurt with granola. Drinks: water with crystal lite. He liked to have cookies, but he stopped. He would like to focus on meal planning. He was thinking about having some healthy choice frozen meals just to have on hand - discussed quick meals like summer rolls with pre-cut vegetables, protein, and dip - this can easily put together - he was interested in trying that. Discussed heart healthy eating  and diabetes friendly eating.      Intervention Plan   Intervention Prescribe, educate and counsel regarding individualized specific dietary modifications aiming towards targeted core components such as weight, hypertension, lipid management, diabetes, heart failure and other comorbidities.;Nutrition handout(s) given to patient.    Expected Outcomes Short Term Goal: Understand basic principles of dietary content, such as calories, fat, sodium, cholesterol and nutrients.;Short Term Goal: A plan has been developed with personal nutrition goals set during dietitian appointment.;Long Term Goal: Adherence to prescribed nutrition plan.             Nutrition Assessments:  MEDIFICTS Score Key: ?70 Need to make dietary changes  40-70 Heart Healthy Diet ? 40 Therapeutic Level Cholesterol Diet  Flowsheet Row Cardiac Rehab from 03/16/2021 in [facility] Cardiac and Pulmonary Rehab  Picture Your Plate Total Score on Admission 54  Picture Your Plate Total Score on Discharge 57      Picture Your Plate Scores: <02 Unhealthy dietary pattern with much room for improvement. 41-50 Dietary pattern unlikely to meet recommendations for good health and room for improvement. 51-60 More healthful dietary pattern, with some room for improvement.  >60 Healthy dietary pattern, although there may be some specific behaviors that could be improved.    Nutrition Goals Re-Evaluation:  Nutrition Goals Re-Evaluation     Row Name 01/08/21 1338 02/02/21 1341 03/02/21 1413         Goals   Nutrition Goal ST: try out different recipes, focu on MyPlate LT: become more confident in meal planning ST: try out different recipes, focu on MyPlate LT: become more confident in meal planning --     Comment Mikki Santee had a good meeting last week.  He is reading food labels and staying away from transfat and saturated fats.  They always make sure that there are vegetables on the table. Mikki Santee is doing better with his diet. He continues to read  food labels and avoids transfat and limits his saturated fats.  They continue to aim for more vegetables. He does aim for a balanced diet.  He has now found that there is not a vegetable that he doesn't like.  He has baked brussell spouts last night. Mikki Santee continues to read labels and focus on eating vegetables.  He had a lot of baked asparagus with parmesan cheese yesterday.  He says he does "cheat"occasionally but overall eats healthy.     Expected Outcome Short: Try new recipes  Long; Continue to focus on healthy eating. Short: Continue to try new things Long: Continue to eat a balance diet Short: continue to read labels and eat lots of vegetables Long: maintain heart healthy diet              Nutrition Goals Discharge (Final Nutrition Goals Re-Evaluation):  Nutrition Goals Re-Evaluation - 03/02/21 1413       Goals   Comment Mikki Santee continues to read labels and focus on eating vegetables.  He had  a lot of baked asparagus with parmesan cheese yesterday.  He says he does "cheat"occasionally but overall eats healthy.    Expected Outcome Short: continue to read labels and eat lots of vegetables Long: maintain heart healthy diet             Psychosocial: Target Goals: Acknowledge presence or absence of significant depression and/or stress, maximize coping skills, provide positive support system. Participant is able to verbalize types and ability to use techniques and skills needed for reducing stress and depression.   Education: Stress, Anxiety, and Depression - Group verbal and visual presentation to define topics covered.  Reviews how body is impacted by stress, anxiety, and depression.  Also discusses healthy ways to reduce stress and to treat/manage anxiety and depression.  Written material given at graduation.   Education: Sleep Hygiene -Provides group verbal and written instruction about how sleep can affect your health.  Define sleep hygiene, discuss sleep cycles and impact of sleep  habits. Review good sleep hygiene tips.    Initial Review & Psychosocial Screening:  Initial Psych Review & Screening - 12/08/20 1542       Initial Review   Current issues with Current Stress Concerns      Family Dynamics   Good Support System? Yes   wife, children, grandchildren     Barriers   Psychosocial barriers to participate in program There are no identifiable barriers or psychosocial needs.;The patient should benefit from training in stress management and relaxation.      Screening Interventions   Interventions Encouraged to exercise;Provide feedback about the scores to participant;To provide support and resources with identified psychosocial needs    Expected Outcomes Short Term goal: Utilizing psychosocial counselor, staff and physician to assist with identification of specific Stressors or current issues interfering with healing process. Setting desired goal for each stressor or current issue identified.;Long Term Goal: Stressors or current issues are controlled or eliminated.;Short Term goal: Identification and review with participant of any Quality of Life or Depression concerns found by scoring the questionnaire.;Long Term goal: The participant improves quality of Life and PHQ9 Scores as seen by post scores and/or verbalization of changes             Quality of Life Scores:   Quality of Life - 03/16/21 1434       Quality of Life Scores   Health/Function Pre 23.2 %    Health/Function Post 23.6 %    Health/Function % Change 1.72 %    Socioeconomic Pre 25.13 %    Socioeconomic Post 27 %    Socioeconomic % Change  7.44 %    Psych/Spiritual Pre 23.14 %    Psych/Spiritual Post 27.43 %    Psych/Spiritual % Change 18.54 %    Family Pre 27.6 %    Family Post 26.4 %    Family % Change -4.35 %    GLOBAL Pre 24.26 %    GLOBAL Post 25.54 %    GLOBAL % Change 5.28 %            Scores of 19 and below usually indicate a poorer quality of life in these areas.  A  difference of  2-3 points is a clinically meaningful difference.  A difference of 2-3 points in the total score of the Quality of Life Index has been associated with significant improvement in overall quality of life, self-image, physical symptoms, and general health in studies assessing change in quality of life.  PHQ-9: Recent Review Flowsheet Data  Depression screen [facility] 2/9 03/16/2021 12/16/2020 11/21/2020 08/22/2020   Decreased Interest 0 0 0 0   Down, Depressed, Hopeless 1 0 0 0   PHQ - 2 Score 1 0 0 0   Altered sleeping 0 0 0 -   Tired, decreased energy 1 0 1 -   Change in appetite 0 0 0 -   Feeling bad or failure about yourself  1 0 0 -   Trouble concentrating 0 0 0 -   Moving slowly or fidgety/restless 0 0 0 -   Suicidal thoughts 0 0 0 -   PHQ-9 Score 3 0 1 -   Difficult doing work/chores Not difficult at all Not difficult at all Not difficult at all -      Interpretation of Total Score  Total Score Depression Severity:  1-4 = Minimal depression, 5-9 = Mild depression, 10-14 = Moderate depression, 15-19 = Moderately severe depression, 20-27 = Severe depression   Psychosocial Evaluation and Intervention:  Psychosocial Evaluation - 12/08/20 1554       Psychosocial Evaluation & Interventions   Interventions Encouraged to exercise with the program and follow exercise prescription    Comments 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 He states he is doing well post op and starting to get back to his usual routine. He is retired from [employer] and likes to stay active.  He and his wife have 6 daughters and 2 sons that live out of town but are very supportive. He made some big lifestyle changes when he had his stents years ago. He and his wife like to kayak and be active so he is ready to get back to that in a safe way. He did report some anxiety/stress from the surgery where he felt the need to make videos for his children in case he didn't make it. He states  that he didn't want them to know about it but he is starting to feel more confident now that its been weeks post op. His hope is this program will help build on his foundation of a heart healthy lifestyle    Expected Outcomes Short: attend cardiac rehab for education and exericse. Long: develop and maintain positive self care habits.    Continue Psychosocial Services  Follow up required by staff             Psychosocial Re-Evaluation:  Psychosocial Re-Evaluation     Row Name 01/08/21 1335 02/02/21 1346 03/02/21 1342         Psychosocial Re-Evaluation   Current issues with Current Stress Concerns Current Stress Concerns Current Stress Concerns     Comments Mikki Santee is doing well in rehab. He has good days and great day!  He always tries to focus on the positive things. His anxiiety is getting better as he gets more information and being able to do more and more each day. He sleeps very well most nights. Mikki Santee is doing well in rehab.  He about two weeks out from his youngest daughter getted married in [place].  He is said that he is just ready for it to be over. His futrue son in law works for [employer] up there and she is working for the court system.  He continues to sleep well.  He has a new puppy at home that they are working on training. Mikki Santee says they are stil training the puppy.  It helps keep him active loooking after the puppy.  Wedding went  well.     Expected Outcomes Short: Continue to exercise for mental boost Long: Continue to focus on positive Short; Get tjhrough wedding in a couple of weeks Long: Continue to focus on positive Short: continue to attend [facility] for the exercise Long:  maintain positive outlook     Interventions Encouraged to attend Cardiac Rehabilitation for the exercise Encouraged to attend Cardiac Rehabilitation for the exercise Encouraged to attend Cardiac Rehabilitation for the exercise     Continue Psychosocial Services  Follow up required by staff -- --               Psychosocial Discharge (Final Psychosocial Re-Evaluation):  Psychosocial Re-Evaluation - 03/02/21 1342       Psychosocial Re-Evaluation   Current issues with Current Stress Concerns    Comments Mikki Santee says they are stil training the puppy.  It helps keep him active loooking after the puppy.  Wedding went well.    Expected Outcomes Short: continue to attend [facility] for the exercise Long:  maintain positive outlook    Interventions Encouraged to attend Cardiac Rehabilitation for the exercise             Vocational Rehabilitation: Provide vocational rehab assistance to qualifying candidates.   Vocational Rehab Evaluation & Intervention:  Vocational Rehab - 12/08/20 1542       Initial Vocational Rehab Evaluation & Intervention   Assessment shows need for Vocational Rehabilitation No             Education: Education Goals: Education classes will be provided on a variety of topics geared toward better understanding of heart health and risk factor modification. Participant will state understanding/return demonstration of topics presented as noted by education test scores.  Learning Barriers/Preferences:  Learning Barriers/Preferences - 12/08/20 1541       Learning Barriers/Preferences   Learning Barriers None    Learning Preferences None             General Cardiac Education Topics:  AED/CPR: - Group verbal and written instruction with the use of models to demonstrate the basic use of the AED with the basic ABC's of resuscitation.   Anatomy and Cardiac Procedures: - Group verbal and visual presentation and models provide information about basic cardiac anatomy and function. Reviews the testing methods done to diagnose heart disease and the outcomes of the test results. Describes the treatment choices: Medical Management, Angioplasty, or Coronary Bypass Surgery for treating various heart conditions including Myocardial Infarction, Angina, Valve Disease, and  Cardiac Arrhythmias.  Written material given at graduation. Flowsheet Row Cardiac Rehab from 03/04/2021 in [facility] Cardiac and Pulmonary Rehab  Date 01/14/21  Educator [facility]  Instruction Review Code 1- Verbalizes Understanding       Medication Safety: - Group verbal and visual instruction to review commonly prescribed medications for heart and lung disease. Reviews the medication, class of the drug, and side effects. Includes the steps to properly store meds and maintain the prescription regimen.  Written material given at graduation.   Intimacy: - Group verbal instruction through game format to discuss how heart and lung disease can affect sexual intimacy. Written material given at graduation.. Flowsheet Row Cardiac Rehab from 03/04/2021 in [facility] Cardiac and Pulmonary Rehab  Date 01/07/21  Educator AS  Instruction Review Code 1- Verbalizes Understanding       Know Your Numbers and Heart Failure: - Group verbal and visual instruction to discuss disease risk factors for cardiac and pulmonary disease and treatment options.  Reviews associated critical  values for Overweight/Obesity, Hypertension, Cholesterol, and Diabetes.  Discusses basics of heart failure: signs/symptoms and treatments.  Introduces Heart Failure Zone chart for action plan for heart failure.  Written material given at graduation.   Infection Prevention: - Provides verbal and written material to individual with discussion of infection control including proper hand washing and proper equipment cleaning during exercise session. Flowsheet Row Cardiac Rehab [facility]  Education need identified 12/16/20  Date 12/16/20  Educator [place]  Instruction Review Code 1- Verbalizes Understanding       Falls Prevention: - Provides verbal and written material to individual with discussion of falls prevention and safety. Flowsheet Row Cardiac Rehab from 03/04/2021 in [facility] Cardiac and Pulmonary Rehab   Education need identified 12/16/20  Date 12/16/20  Educator [place]  Instruction Review Code 1- Verbalizes Understanding       Other: -Provides group and verbal instruction on various topics (see comments)   Knowledge Questionnaire Score:  Knowledge Questionnaire Score - 03/16/21 1434       Knowledge Questionnaire Score   Pre Score 24/26: Angina, Exercise    Post Score 26/26             Core Components/Risk Factors/Patient Goals at Admission:  Personal Goals and Risk Factors at Admission - 12/16/20 1546       Core Components/Risk Factors/Patient Goals on Admission    Weight Management Yes;Weight Loss    Intervention Weight Management: Develop a combined nutrition and exercise program designed to reach desired caloric intake, while maintaining appropriate intake of nutrient and fiber, sodium and fats, and appropriate energy expenditure required for the weight goal.;Weight Management: Provide education and appropriate resources to help participant work on and attain dietary goals.;Weight Management/Obesity: Establish reasonable short term and long term weight goals.    Admit Weight 196 lb (88.9 kg)    Goal Weight: Short Term 191 lb (86.6 kg)    Goal Weight: Long Term 186 lb (84.4 kg)    Expected Outcomes Short Term: Continue to assess and modify interventions until short term weight is achieved;Long Term: Adherence to nutrition and physical activity/exercise program aimed toward attainment of established weight goal;Weight Loss: Understanding of general recommendations for a balanced deficit meal plan, which promotes 1-2 lb weight loss per week and includes a negative energy balance of [phone] kcal/d;Understanding recommendations for meals to include 15-35% energy as protein, 25-35% energy from fat, 35-60% energy from carbohydrates, less than 261m of dietary cholesterol, 20-35 gm of total fiber daily;Understanding of distribution of calorie intake throughout the day with the  consumption of 4-5 meals/snacks    Diabetes Yes    Intervention Provide education about signs/symptoms and action to take for hypo/hyperglycemia.;Provide education about proper nutrition, including hydration, and aerobic/resistive exercise prescription along with prescribed medications to achieve blood glucose in normal ranges: Fasting glucose 65-99 mg/dL    Hypertension Yes    Intervention Provide education on lifestyle modifcations including regular physical activity/exercise, weight management, moderate sodium restriction and increased consumption of fresh fruit, vegetables, and low fat dairy, alcohol moderation, and smoking cessation.;Monitor prescription use compliance.    Expected Outcomes Short Term: Continued assessment and intervention until BP is < 140/972mHG in hypertensive participants. < 130/8063mG in hypertensive participants with diabetes, heart failure or chronic kidney disease.;Long Term: Maintenance of blood pressure at goal levels.    Lipids Yes    Intervention Provide education and support for participant on nutrition & aerobic/resistive exercise along with prescribed medications to achieve  LDL <10m, HDL >478m             Education:Diabetes - Individual verbal and written instruction to review signs/symptoms of diabetes, desired ranges of glucose level fasting, after meals and with exercise. Acknowledge that pre and post exercise glucose checks will be done for 3 sessions at entry of program. [facility]  Education need identified 12/16/20  Date 12/16/20  Educator KLDubberlyInstruction Review Code 1- Verbalizes Understanding       Core Components/Risk Factors/Patient Goals Review:   Goals and Risk Factor Review     Row Name 01/08/21 1340 02/02/21 1344 03/02/21 1337         Core Components/Risk Factors/Patient Goals Review   Personal Goals Review Weight  Management/Obesity;Hypertension;Diabetes;Lipids Weight Management/Obesity;Hypertension;Diabetes;Lipids Weight Management/Obesity;Hypertension;Diabetes     Review BoMikki Santees holding steady on his weight and keeping his weight under 200 lb.  He has kept it down for the last 6-7 years.  Blood pressures are doing well in class. He doesn't check them at home as he feels they are doing well.  His blood sugars have been good and he does check them at home routinely. BoMikki Santees doing well in rehab.  His weight is staying between 196-199 lb. He is pleased with his progress and continues to work to get it down.  Blood pressures and sugars continue to do well. Overall, feeling pretty good. Bob's weight is up to 205 today.  He has a Dr appointment Friday to follow up.  He took copies of EKG to Dr last week as it has changed.  He says he can feel he is "off".  BP has been good when he checks at home.  His HR has been low on home BP machine.  His sugars are ok at home.  Staff advised him to call 911 if symptoms worsen before he sees Dr.     ExNoberto Retortutcomes Short: Continue to monitor blood sugars Long: Continue to montior risk factors. Short: Continue to work on weight loss Long: Continue to monitor risk factors. Short: follow up with Dr LoLaverta Baltimoremanage risk factors long term              Core Components/Risk Factors/Patient Goals at Discharge (Final Review):   Goals and Risk Factor Review - 03/02/21 1337       Core Components/Risk Factors/Patient Goals Review   Personal Goals Review Weight Management/Obesity;Hypertension;Diabetes    Review Bob's weight is up to 205 today.  He has a Dr appointment Friday to follow up.  He took copies of EKG to Dr last week as it has changed.  He says he can feel he is "off".  BP has been good when he checks at home.  His HR has been low on home BP machine.  His sugars are ok at home.  Staff advised him to call 911 if symptoms worsen before he sees Dr.    ExNoberto Retortutcomes Short: follow up  with Dr LoLaverta Baltimoremanage risk factors long term             ITP Comments:  ITP Comments     Row Name 12/08/20 1539 12/16/20 1513 12/17/20 1524 12/24/20 0744 12/31/20 1609   ITP Comments Initial telephone orientation completed. [facility] EP orientation scheduled for Tuesday 8/2 at 10am. Completed 6MWT and gym orientation. Initial ITP created and sent for review to Dr. MaEmily FilbertMedical Director. First full day  of exercise!  Patient was oriented to gym and equipment including functions, settings, policies, and procedures.  Patient's individual exercise prescription and treatment plan were reviewed.  All starting workloads were established based on the results of the 6 minute walk test done at initial orientation visit.  The plan for exercise progression was also introduced and progression will be customized based on patient's performance and goals. 30 Day review completed. Medical Director ITP review done, changes made as directed, and signed approval by Medical Director.    New to program Completed initial RD consultation    Row Name 01/21/21 0644 02/18/21 0939 03/02/21 1341 03/18/21 0745     ITP Comments 30 Day review completed. Medical Director ITP review done, changes made as directed, and signed approval by Medical Director. 30 day review completed. ITP sent to Dr. Emily Filbert, Medical Director of Cardiac Rehab. Continue with ITP unless changes are made by physician. Has doctor appointment Friday, 10/21, concerning fluctuating heart rate and change in rhythm. Continues to be asymptomatic. 30 Day review completed. Medical Director ITP review done, changes made as directed, and signed approval by Medical Director.             Comments:  30 Day review completed. Medical Director ITP review done, changes made as directed, and signed approval by Medical Director.

## 2021-03-18 NOTE — Progress Notes (Signed)
Daily Session Note  Patient Details  Name: Ronald Roth MRN: [id number] Date of Birth: May 03, 1951 Referring Provider:   Flowsheet Row Cardiac Rehab from 12/16/2020 in [facility] Cardiac and Pulmonary Rehab  Referring Provider [name] MD       Encounter Date: 03/18/2021  Check In:  Session Check In - 03/18/21 1340       Check-In   Supervising physician immediately available to respond to emergencies See telemetry face sheet for immediately available ER MD    Location [facility]-Cardiac & Pulmonary Rehab    Staff Present Nyoka Cowden, RN, BSN, Willette Pa, MA, RCEP, CCRP, CCET;Joseph [place], [place]    Virtual Visit No    Medication changes reported     No    Fall or balance concerns reported    No    Tobacco Cessation No Change    Warm-up and Cool-down Performed on first and last piece of equipment    Resistance Training Performed Yes    VAD Patient? No    PAD/SET Patient? No                Social History   Tobacco Use  Smoking Status Former   Packs/day: 1.00   Years: 30.00   Pack years: 30.00   Types: Cigarettes   Quit date: 04/18/1991   Years since quitting: 29.9  Smokeless Tobacco Never    Goals Met:  Independence with exercise equipment Exercise tolerated well No report of concerns or symptoms today  Goals Unmet:  Not Applicable  Comments: Pt able to follow exercise prescription today without complaint.  Will continue to monitor for progression.    Dr. Emily Filbert is Medical Director for [place].  Dr. Ottie Glazier is Medical Director for [facility] Pulmonary Rehabilitation.

## 2021-03-18 NOTE — Progress Notes (Signed)
Daily Session Note  Patient Details  Name: Ronald Roth MRN: [id number] Date of Birth: 1951/02/24 Referring Provider:   Flowsheet Row Cardiac Rehab from 12/16/2020 in [facility] Cardiac and Pulmonary Rehab  Referring Provider [name] MD       Encounter Date: 03/18/2021  Check In:  Session Check In - 03/18/21 1338       Check-In   Supervising physician immediately available to respond to emergencies See telemetry face sheet for immediately available ER MD    Location [facility]-Cardiac & Pulmonary Rehab    Staff Present Nyoka Cowden, RN, BSN, Willette Pa, MA, RCEP, CCRP, CCET;Joseph [place], [place]    Virtual Visit No    Medication changes reported     No    Fall or balance concerns reported    No    Tobacco Cessation No Change    Warm-up and Cool-down Performed on first and last piece of equipment    Resistance Training Performed Yes    VAD Patient? No    PAD/SET Patient? No      Pain Assessment   Currently in Pain? No/denies                Social History   Tobacco Use  Smoking Status Former   Packs/day: 1.00   Years: 30.00   Pack years: 30.00   Types: Cigarettes   Quit date: 04/18/1991   Years since quitting: 29.9  Smokeless Tobacco Never    Goals Met:  Independence with exercise equipment Exercise tolerated well No report of concerns or symptoms today  Goals Unmet:  Not Applicable  Comments: Pt able to follow exercise prescription today without complaint.  Will continue to monitor for progression.    Dr. Emily Filbert is Medical Director for [place].  Dr. Ottie Glazier is Medical Director for [facility] Pulmonary Rehabilitation.

## 2021-03-19 ENCOUNTER — Encounter: Payer: Managed Care, Other (non HMO) | Admitting: *Deleted

## 2021-03-19 ENCOUNTER — Other Ambulatory Visit: Payer: Self-pay

## 2021-03-19 DIAGNOSIS — Z951 Presence of aortocoronary bypass graft: Secondary | ICD-10-CM | POA: Diagnosis not present

## 2021-03-19 NOTE — Progress Notes (Signed)
Discharge Progress Report  Patient Details  Name: Ronald Roth MRN: [id number] Date of Birth: 11-02-1950 Referring Provider:   Flowsheet Row Cardiac Rehab from 12/16/2020 in [facility] Cardiac and Pulmonary Rehab  Referring Provider Rockey Situ, TImothy MD        Number of Visits: 69  Reason for Discharge:  Patient reached a stable level of exercise. Patient independent in their exercise. Patient has met program and personal goals.  Smoking History:  Social History   Tobacco Use  Smoking Status Former   Packs/day: 1.00   Years: 30.00   Pack years: 30.00   Types: Cigarettes   Quit date: 04/18/1991   Years since quitting: 29.9  Smokeless Tobacco Never    Diagnosis:  S/P CABG x 4  ADL UCSD:   Initial Exercise Prescription:  Initial Exercise Prescription - 12/16/20 1500       Date of Initial Exercise RX and Referring Provider   Date 12/16/20    Referring Provider Rockey Situ, TImothy MD      Treadmill   MPH 2.3    Grade 0.5    Minutes 15    METs 2.92      Recumbant Bike   Level 2    RPM 60    Watts 25    Minutes 15    METs 2.9      NuStep   Level 2    SPM 80    Minutes 15    METs 2.9      REL-XR   Level 1    Speed 50    Minutes 15    METs 2.9      T5 Nustep   Level 1    SPM 80    Minutes 15    METs 2.9      Prescription Details   Frequency (times per week) 3    Duration Progress to 30 minutes of continuous aerobic without signs/symptoms of physical distress      Intensity   THRR 40-80% of Max Heartrate 117-138    Ratings of Perceived Exertion 11-13    Perceived Dyspnea 0-4      Progression   Progression Continue to progress workloads to maintain intensity without signs/symptoms of physical distress.      Resistance Training   Training Prescription Yes    Weight 4 lb    Reps 10-15             Discharge Exercise Prescription (Final Exercise Prescription Changes):  Exercise Prescription Changes - 03/16/21 1600       Response to Exercise    Blood Pressure (Admit) 162/80    Blood Pressure (Exit) 158/76    Heart Rate (Admit) 73 bpm    Heart Rate (Exercise) 104 bpm    Heart Rate (Exit) 49 bpm    Rating of Perceived Exertion (Exercise) 13    Duration Continue with 30 min of aerobic exercise without signs/symptoms of physical distress.    Intensity THRR unchanged      Progression   Progression Continue to progress workloads to maintain intensity without signs/symptoms of physical distress.    Average METs 4.5      Resistance Training   Training Prescription Yes    Weight 5 lb    Reps 10-15      Interval Training   Interval Training No      Treadmill   MPH 2.7    Grade 0    Minutes 15    METs 3.01      REL-XR  Level 7    Minutes 15    METs 6      Home Exercise Plan   Plans to continue exercise at Home (comment)   walking, staff videos   Frequency Add 2 additional days to program exercise sessions.    Initial Home Exercises Provided 12/24/20      Oxygen   Maintain Oxygen Saturation 88% or higher             Functional Capacity:  6 Minute Walk     Row Name 12/16/20 1530 03/04/21 1406       6 Minute Walk   Phase Initial Discharge    Distance 1225 feet 1290 feet    Distance % Change -- 5.3 %    Distance Feet Change -- 65 ft    Walk Time 6 minutes 6 minutes    # of Rest Breaks 0 0    MPH 2.32 2.44    METS 2.9 2.76    RPE 12 12    Perceived Dyspnea  0 2    VO2 Peak 10.15 9.66    Symptoms No Yes (comment)    Comments -- knee pain 5/10, SOB    Resting HR 96 bpm 75 bpm    Resting BP 134/68 114/66    Resting Oxygen Saturation  96 % --    Exercise Oxygen Saturation  during 6 min walk 97 % --    Max Ex. HR 106 bpm 77 bpm    Max Ex. BP 140/64 166/74    2 Minute Post BP 122/68 --             Psychological, QOL, Others - Outcomes: PHQ 2/9: Depression screen [facility] 2/9 03/16/2021 12/16/2020 11/21/2020 08/22/2020  Decreased Interest 0 0 0 0  Down, Depressed, Hopeless 1 0 0 0  PHQ - 2 Score 1 0 0  0  Altered sleeping 0 0 0 -  Tired, decreased energy 1 0 1 -  Change in appetite 0 0 0 -  Feeling bad or failure about yourself  1 0 0 -  Trouble concentrating 0 0 0 -  Moving slowly or fidgety/restless 0 0 0 -  Suicidal thoughts 0 0 0 -  PHQ-9 Score 3 0 1 -  Difficult doing work/chores Not difficult at all Not difficult at all Not difficult at all -    Quality of Life:  Quality of Life - 03/16/21 1434       Quality of Life Scores   Health/Function Pre 23.2 %    Health/Function Post 23.6 %    Health/Function % Change 1.72 %    Socioeconomic Pre 25.13 %    Socioeconomic Post 27 %    Socioeconomic % Change  7.44 %    Psych/Spiritual Pre 23.14 %    Psych/Spiritual Post 27.43 %    Psych/Spiritual % Change 18.54 %    Family Pre 27.6 %    Family Post 26.4 %    Family % Change -4.35 %    GLOBAL Pre 24.26 %    GLOBAL Post 25.54 %    GLOBAL % Change 5.28 %            Nutrition & Weight - Outcomes:  Pre Biometrics - 12/16/20 1522       Pre Biometrics   Height 5' 10.75" (1.797 m)    Weight 196 lb 3.2 oz (89 kg)    BMI (Calculated) 27.56    Single Leg Stand 6.22 seconds  Post Biometrics - 03/04/21 1407        Post  Biometrics   Height 5' 10.75" (1.797 m)    Weight 207 lb 4.8 oz (94 kg)    BMI (Calculated) 29.12    Single Leg Stand 4.7 seconds            Nutrition:  Nutrition Therapy & Goals - 12/31/20 1610       Nutrition Therapy   Diet Heart healthy, low Na, diabetes friendly    Drug/Food Interactions Statins/Certain Fruits    Protein (specify units) 70g    Fiber 30 grams    Whole Grain Foods 3 servings    Saturated Fats 12 max. grams    Fruits and Vegetables 8 servings/day    Sodium 1.5 grams      Personal Nutrition Goals   Nutrition Goal ST: try out different recipes, focu on MyPlate LT: become more confident in meal planning    Comments B: piece of toast with butter to let him take medications (whole grain thin bread) with coffee (  creamer no sugar) L: hotdog and some chips or [place] and cheese sandwich with mayo D: portions have reduced. Vegetable with a meat (grill, baked) - eats chicken more often. He reports liking his non-starchy vegetables S: lite yogurt with granola. Drinks: water with crystal lite. He liked to have cookies, but he stopped. He would like to focus on meal planning. He was thinking about having some healthy choice frozen meals just to have on hand - discussed quick meals like summer rolls with pre-cut vegetables, protein, and dip - this can easily put together - he was interested in trying that. Discussed heart healthy eating and diabetes friendly eating.      Intervention Plan   Intervention Prescribe, educate and counsel regarding individualized specific dietary modifications aiming towards targeted core components such as weight, hypertension, lipid management, diabetes, heart failure and other comorbidities.;Nutrition handout(s) given to patient.    Expected Outcomes Short Term Goal: Understand basic principles of dietary content, such as calories, fat, sodium, cholesterol and nutrients.;Short Term Goal: A plan has been developed with personal nutrition goals set during dietitian appointment.;Long Term Goal: Adherence to prescribed nutrition plan.             Education Questionnaire Score:  Knowledge Questionnaire Score - 03/16/21 1434       Knowledge Questionnaire Score   Pre Score 24/26: Angina, Exercise    Post Score 26/26             Goals reviewed with patient; copy given to patient.

## 2021-03-19 NOTE — Progress Notes (Signed)
Cardiac Individual Treatment Plan  Patient Details  Name: Ronald Roth MRN: [id number] Date of Birth: 03-Oct-1950 Referring Provider:   Flowsheet Row Cardiac Rehab from 12/16/2020 in [facility] Cardiac and Pulmonary Rehab  Referring Provider [name] MD       Initial Encounter Date:  Flowsheet Row Cardiac Rehab from 12/16/2020 in [facility] Cardiac and Pulmonary Rehab  Date 12/16/20       Visit Diagnosis: S/P CABG x 4  Patient's Home Medications on Admission:  Current Outpatient Medications:    acetaminophen (TYLENOL) 500 MG tablet, Take 2 tablets (1,000 mg total) by mouth every 6 (six) hours as needed., Disp: 30 tablet, Rfl: 0   aspirin EC 81 MG tablet, Take 81 mg by mouth in the morning. Swallow whole., Disp: , Rfl:    atorvastatin (LIPITOR) 80 MG tablet, Take 1 tablet (80 mg total) by mouth in the morning. Take 80 mg by mouth in the morning., Disp: 90 tablet, Rfl: 3   diphenhydrAMINE (BENADRYL) 25 MG tablet, Take 75 mg by mouth at bedtime., Disp: , Rfl:    ezetimibe (ZETIA) 10 MG tablet, Take 1 tablet (10 mg total) by mouth in the morning., Disp: 90 tablet, Rfl: 3   fenofibrate (TRICOR) 145 MG tablet, Take 145 mg by mouth in the morning., Disp: , Rfl:    furosemide (LASIX) 20 MG tablet, Take 1 tablet (20 mg total) by mouth daily., Disp: 30 tablet, Rfl: 3   JARDIANCE 25 MG TABS tablet, Take 1 tablet (25 mg total) by mouth daily before breakfast., Disp: 90 tablet, Rfl: 3   metoprolol succinate (TOPROL-XL) 50 MG 24 hr tablet, Take 1 tablet (50 mg total) by mouth daily. Take with or immediately following a meal., Disp: 90 tablet, Rfl: 3   nitroGLYCERIN (NITROSTAT) 0.4 MG SL tablet, Place 1 tablet (0.4 mg total) under the tongue every 5 (five) minutes x 3 doses as needed for chest pain., Disp: 25 tablet, Rfl: 3   omeprazole (PRILOSEC) 20 MG capsule, Take 20 mg by mouth in the morning., Disp: , Rfl:    OZEMPIC, 0.25 OR 0.5 MG/DOSE, 2 MG/1.5ML SOPN, INJECT SUBCUTANEOUSLY 0.5  MG EVERY WEEK,  Disp: 4.5 mL, Rfl: 1   potassium chloride (KLOR-CON) 10 MEQ tablet, Take 1 tablet (10 mEq total) by mouth daily., Disp: 30 tablet, Rfl: 3   sacubitril-valsartan (ENTRESTO) 24-26 MG, Take 1 tablet by mouth 2 (two) times daily., Disp: 60 tablet, Rfl: 11   Vitamin D, Ergocalciferol, (DRISDOL) 1.25 MG (50000 UNIT) CAPS capsule, Take 50,000 Units by mouth 2 (two) times a week. Sundays & Thursdays, Disp: , Rfl:   Past Medical History: Past Medical History:  Diagnosis Date   Anxiety    Coronary artery disease    Diabetes mellitus without complication (HCC)    GERD (gastroesophageal reflux disease)    Heart attack ([place]) 2012   Hypertension    Sciatica     Tobacco Use: Social History   Tobacco Use  Smoking Status Former   Packs/day: 1.00   Years: 30.00   Pack years: 30.00   Types: Cigarettes   Quit date: 04/18/1991   Years since quitting: 29.9  Smokeless Tobacco Never    Labs: Recent Review Flowsheet Data     Labs for ITP Cardiac and Pulmonary Rehab Latest Ref Rng & Units 10/31/2020 10/31/2020 10/31/2020 10/31/2020 11/19/2020   Cholestrol 100 - 199 mg/dL - - - - 135   LDLCALC 0 - 99 mg/dL - - - - 79   HDL >39  mg/dL - - - - 30(L)   Trlycerides 0 - 149 mg/dL - - - - 149   Hemoglobin A1c 4.8 - 5.6 % - - - - 7.3(H)   PHART 7.350 - 7.450 - 7.352 7.343(L) 7.303(L) -   PCO2ART 32.0 - 48.0 mmHg - 43.0 44.9 43.8 -   HCO3 20.0 - 28.0 mmol/L - 24.4 24.4 21.6 -   TCO2 22 - 32 mmol/L _0 -   ACIDBASEDEF 0.0 - 2.0 mmol/L - 2.0 2.0 5.0(H) -   O2SAT % - 95.0 98.0 97.0 -        Exercise Target Goals: Exercise Program Goal: Individual exercise prescription set using results from initial 6 min walk test and THRR while considering  patient's activity barriers and safety.   Exercise Prescription Goal: Initial exercise prescription builds to 30-45 minutes a day of aerobic activity, 2-3 days per week.  Home exercise guidelines will be given to patient during program as part of exercise  prescription that the participant will acknowledge.   Education: Aerobic Exercise: - Group verbal and visual presentation on the components of exercise prescription. Introduces F.I.T.T principle from ACSM for exercise prescriptions.  Reviews F.I.T.T. principles of aerobic exercise including progression. Written material given at graduation. Flowsheet Row Cardiac Rehab from 03/18/2021 in [facility] Cardiac and Pulmonary Rehab  Date 01/07/21  Educator AS  Instruction Review Code 1- Verbalizes Understanding       Education: Resistance Exercise: - Group verbal and visual presentation on the components of exercise prescription. Introduces F.I.T.T principle from ACSM for exercise prescriptions  Reviews F.I.T.T. principles of resistance exercise including progression. Written material given at graduation. Flowsheet Row Cardiac Rehab from 03/18/2021 in [facility] Cardiac and Pulmonary Rehab  Date 01/14/21  Educator AS  Instruction Review Code 1- Verbalizes Understanding        Education: Exercise & Equipment Safety: - Individual verbal instruction and demonstration of equipment use and safety with use of the equipment. Flowsheet Row Cardiac Rehab from 03/18/2021 in [facility] Cardiac and Pulmonary Rehab  Education need identified 12/16/20  Date 12/16/20  Educator [place]  Instruction Review Code 1- Verbalizes Understanding       Education: Exercise Physiology & General Exercise Guidelines: - Group verbal and written instruction with models to review the exercise physiology of the cardiovascular system and associated critical values. Provides general exercise guidelines with specific guidelines to those with heart or lung disease.  Flowsheet Row Cardiac Rehab from 03/18/2021 in [facility] Cardiac and Pulmonary Rehab  Date 12/31/20  Educator AS  Instruction Review Code 1- Verbalizes Understanding       Education: Flexibility, Balance, Mind/Body Relaxation: - Group verbal and visual presentation with interactive  activity on the components of exercise prescription. Introduces F.I.T.T principle from ACSM for exercise prescriptions. Reviews F.I.T.T. principles of flexibility and balance exercise training including progression. Also discusses the mind body connection.  Reviews various relaxation techniques to help reduce and manage stress (i.e. Deep breathing, progressive muscle relaxation, and visualization). Balance handout provided to take home. Written material given at graduation. Flowsheet Row Cardiac Rehab from 03/18/2021 in [facility] Cardiac and Pulmonary Rehab  Date 01/21/21  Educator AS  Instruction Review Code 1- Verbalizes Understanding       Activity Barriers & Risk Stratification:  Activity Barriers & Cardiac Risk Stratification - 12/16/20 1523       Activity Barriers & Cardiac Risk Stratification   Activity Barriers Joint Problems;Balance Concerns;Decreased Ventricular Function;Other (comment);Deconditioning    Comments Limited ROM- right shoulder  Cardiac Risk Stratification High             6 Minute Walk:  6 Minute Walk     Row Name 12/16/20 1530 03/04/21 1406       6 Minute Walk   Phase Initial Discharge    Distance 1225 feet 1290 feet    Distance % Change -- 5.3 %    Distance Feet Change -- 65 ft    Walk Time 6 minutes 6 minutes    # of Rest Breaks 0 0    MPH 2.32 2.44    METS 2.9 2.76    RPE 12 12    Perceived Dyspnea  0 2    VO2 Peak 10.15 9.66    Symptoms No Yes (comment)    Comments -- knee pain 5/10, SOB    Resting HR 96 bpm 75 bpm    Resting BP 134/68 114/66    Resting Oxygen Saturation  96 % --    Exercise Oxygen Saturation  during 6 min walk 97 % --    Max Ex. HR 106 bpm 77 bpm    Max Ex. BP 140/64 166/74    2 Minute Post BP 122/68 --             Oxygen Initial Assessment:   Oxygen Re-Evaluation:   Oxygen Discharge (Final Oxygen Re-Evaluation):   Initial Exercise Prescription:  Initial Exercise Prescription - 12/16/20 1500       Date  of Initial Exercise RX and Referring Provider   Date 12/16/20    Referring Provider Ronald Roth, TImothy MD      Treadmill   MPH 2.3    Grade 0.5    Minutes 15    METs 2.92      Recumbant Bike   Level 2    RPM 60    Watts 25    Minutes 15    METs 2.9      NuStep   Level 2    SPM 80    Minutes 15    METs 2.9      REL-XR   Level 1    Speed 50    Minutes 15    METs 2.9      T5 Nustep   Level 1    SPM 80    Minutes 15    METs 2.9      Prescription Details   Frequency (times per week) 3    Duration Progress to 30 minutes of continuous aerobic without signs/symptoms of physical distress      Intensity   THRR 40-80% of Max Heartrate 117-138    Ratings of Perceived Exertion 11-13    Perceived Dyspnea 0-4      Progression   Progression Continue to progress workloads to maintain intensity without signs/symptoms of physical distress.      Resistance Training   Training Prescription Yes    Weight 4 lb    Reps 10-15             Perform Capillary Blood Glucose checks as needed.  Exercise Prescription Changes:   Exercise Prescription Changes     Row Name 12/22/20 1200 12/24/20 1400 01/05/21 1000 01/20/21 1300 02/02/21 1300     Response to Exercise   Blood Pressure (Admit) 142/74 -- 128/60 122/64 124/70   Blood Pressure (Exercise) 138/64 -- 128/58 -- --   Blood Pressure (Exit) 128/62 -- 118/60 104/64 106/64   Heart Rate (Admit) 79 bpm -- 82 bpm 106 bpm 89  bpm   Heart Rate (Exercise) 111 bpm -- 109 bpm 119 bpm 125 bpm   Heart Rate (Exit) 101 bpm -- 88 bpm 96 bpm 91 bpm   Rating of Perceived Exertion (Exercise) 12 -- _0 Symptoms -- -- none none none   Comments second day exercise -- -- -- --   Duration Progress to 30 minutes of  aerobic without signs/symptoms of physical distress -- Continue with 30 min of aerobic exercise without signs/symptoms of physical distress. Continue with 30 min of aerobic exercise without signs/symptoms of physical distress.  Continue with 30 min of aerobic exercise without signs/symptoms of physical distress.   Intensity THRR unchanged -- THRR unchanged THRR unchanged THRR unchanged     Progression   Progression Continue to progress workloads to maintain intensity without signs/symptoms of physical distress. -- Continue to progress workloads to maintain intensity without signs/symptoms of physical distress. Continue to progress workloads to maintain intensity without signs/symptoms of physical distress. Continue to progress workloads to maintain intensity without signs/symptoms of physical distress.   Average METs 2.93 -- 3.31 3.7 5.12     Resistance Training   Training Prescription Yes -- Yes Yes Yes   Weight 4 lb -- 4 lb 4 lb 5 lb   Reps 10-15 -- 10-15 10-15 10-15     Interval Training   Interval Training -- -- No No No     Treadmill   MPH 2.5 -- 2.8 2.8 3   Grade 1 -- 0.5 3.5 3.5   Minutes 15 -- _1 METs 3.26 -- 3.26 4.49 4.75     Recumbant Bike   Level -- -- -- 6 --   RPM -- -- -- 60 --   Minutes -- -- -- 15 --   METs -- -- -- 2.88 --     Recumbant Elliptical   Level -- -- -- -- 5   Minutes -- -- -- -- 15   METs -- -- -- -- 4.2     REL-XR   Level 3 -- 5 -- 7   Speed 50 -- -- -- --   Minutes 15 -- 15 -- 15   METs 2.6 -- 4.7 -- 6.4     T5 Nustep   Level -- -- 1 -- --   Minutes -- -- 15 -- --   METs -- -- 2.2 -- --     Home Exercise Plan   Plans to continue exercise at -- Home (comment)  walking, staff videos Home (comment)  walking, staff videos Home (comment)  walking, staff videos Home (comment)  walking, staff videos   Frequency -- Add 2 additional days to program exercise sessions. Add 2 additional days to program exercise sessions. Add 2 additional days to program exercise sessions. Add 2 additional days to program exercise sessions.   Initial Home Exercises Provided -- 12/24/20 12/24/20 12/24/20 12/24/20     Oxygen   Maintain Oxygen Saturation -- -- -- -- 88% or higher     Row Name 02/16/21 1500 03/02/21 1000 03/16/21 1600         Response to Exercise   Blood Pressure (Admit) 112/60 124/82 162/80     Blood Pressure (Exercise) -- 142/64 --     Blood Pressure (Exit) 102/58 124/74 158/76     Heart Rate (Admit) 94 bpm 44 bpm 73 bpm     Heart Rate (Exercise) 119 bpm 96 bpm 104 bpm     Heart Rate (Exit) 96  bpm 71 bpm 49 bpm     Rating of Perceived Exertion (Exercise) _0 Symptoms none fatigued --     Duration Continue with 30 min of aerobic exercise without signs/symptoms of physical distress. Continue with 30 min of aerobic exercise without signs/symptoms of physical distress. Continue with 30 min of aerobic exercise without signs/symptoms of physical distress.     Intensity THRR unchanged THRR unchanged THRR unchanged       Progression   Progression Continue to progress workloads to maintain intensity without signs/symptoms of physical distress. Continue to progress workloads to maintain intensity without signs/symptoms of physical distress. Continue to progress workloads to maintain intensity without signs/symptoms of physical distress.     Average METs 4.4 4.3 4.5       Resistance Training   Training Prescription Yes Yes Yes     Weight 5 lb 5 lb 5 lb     Reps 10-15 10-15 10-15       Interval Training   Interval Training No No No       Treadmill   MPH 3.2 3.2 2.7     Grade 3.5 3.5 0     Minutes _1 METs 4.99 4.99 3.01       NuStep   Level 6 -- --     Minutes 15 -- --     METs 3.8 -- --       Recumbant Elliptical   Level -- 4 --     Minutes -- 15 --     METs -- 4 --       REL-XR   Level -- -- 7     Minutes -- -- 15     METs -- -- 6       Home Exercise Plan   Plans to continue exercise at Home (comment)  walking, staff videos Home (comment)  walking, staff videos Home (comment)  walking, staff videos     Frequency Add 2 additional days to program exercise sessions. Add 2 additional days to program exercise sessions. Add 2  additional days to program exercise sessions.     Initial Home Exercises Provided 12/24/20 12/24/20 12/24/20       Oxygen   Maintain Oxygen Saturation 88% or higher 88% or higher 88% or higher              Exercise Comments:   Exercise Comments     Row Name 03/02/21 1342           Exercise Comments Has doctor appointment Friday, 10/21, concerning fluctuating heart rate and change in rhythm. Continues to be asymptomatic.                Exercise Goals and Review:   Exercise Goals     Row Name 12/16/20 1545             Exercise Goals   Increase Physical Activity Yes       Intervention Provide advice, education, support and counseling about physical activity/exercise needs.;Develop an individualized exercise prescription for aerobic and resistive training based on initial evaluation findings, risk stratification, comorbidities and participant's personal goals.       Expected Outcomes Short Term: Attend rehab on a regular basis to increase amount of physical activity.;Long Term: Add in home exercise to make exercise part of routine and to increase amount of physical activity.;Long Term: Exercising regularly at least 3-5 days a week.  Increase Strength and Stamina Yes       Intervention Provide advice, education, support and counseling about physical activity/exercise needs.;Develop an individualized exercise prescription for aerobic and resistive training based on initial evaluation findings, risk stratification, comorbidities and participant's personal goals.       Expected Outcomes Short Term: Increase workloads from initial exercise prescription for resistance, speed, and METs.;Short Term: Perform resistance training exercises routinely during rehab and add in resistance training at home;Long Term: Improve cardiorespiratory fitness, muscular endurance and strength as measured by increased METs and functional capacity (6MWT)       Able to understand and use rate of  perceived exertion (RPE) scale Yes       Intervention Provide education and explanation on how to use RPE scale       Expected Outcomes Short Term: Able to use RPE daily in rehab to express subjective intensity level;Long Term:  Able to use RPE to guide intensity level when exercising independently       Able to understand and use Dyspnea scale Yes       Intervention Provide education and explanation on how to use Dyspnea scale       Expected Outcomes Short Term: Able to use Dyspnea scale daily in rehab to express subjective sense of shortness of breath during exertion;Long Term: Able to use Dyspnea scale to guide intensity level when exercising independently       Knowledge and understanding of Target Heart Rate Range (THRR) Yes       Intervention Provide education and explanation of THRR including how the numbers were predicted and where they are located for reference       Expected Outcomes Short Term: Able to state/look up THRR;Long Term: Able to use THRR to govern intensity when exercising independently;Short Term: Able to use daily as guideline for intensity in rehab       Able to check pulse independently Yes       Intervention Provide education and demonstration on how to check pulse in carotid and radial arteries.;Review the importance of being able to check your own pulse for safety during independent exercise       Expected Outcomes Short Term: Able to explain why pulse checking is important during independent exercise;Long Term: Able to check pulse independently and accurately       Understanding of Exercise Prescription Yes       Intervention Provide education, explanation, and written materials on patient's individual exercise prescription       Expected Outcomes Short Term: Able to explain program exercise prescription;Long Term: Able to explain home exercise prescription to exercise independently                Exercise Goals Re-Evaluation :  Exercise Goals Re-Evaluation      Row Name 12/17/20 1525 12/22/20 1301 12/24/20 1410 01/05/21 1020 01/08/21 1333     Exercise Goal Re-Evaluation   Exercise Goals Review Increase Physical Activity;Increase Strength and Stamina;Able to understand and use rate of perceived exertion (RPE) scale;Knowledge and understanding of Target Heart Rate Range (THRR);Able to check pulse independently;Understanding of Exercise Prescription Increase Physical Activity;Increase Strength and Stamina Increase Physical Activity;Increase Strength and Stamina;Able to understand and use rate of perceived exertion (RPE) scale;Knowledge and understanding of Target Heart Rate Range (THRR);Understanding of Exercise Prescription;Able to understand and use Dyspnea scale;Able to check pulse independently Increase Physical Activity;Increase Strength and Stamina Increase Physical Activity;Increase Strength and Stamina;Understanding of Exercise Prescription   Comments Reviewed RPE and dyspnea scales, THR  and program prescription with pt today.  Pt voiced understanding and was given a copy of goals to take home. Ronald Roth has done well in his first days.  He has increased levels on TM and XR.  Staff will monitor progress. Reviewed home exercise with pt today.  Pt plans to walking with dog and staff videos at home for exercise.  Reviewed THR, pulse, RPE, sign and symptoms, pulse oximetery and when to call 911 or MD.  Also discussed weather considerations and indoor options.  Pt voiced understanding. Ronald Roth is doing well in rehab. He has already increased to level 5 on the XR and increased to a speed of 2.8 on the treadmill. We will continue to monitor Ronald Roth is doing well in rehab.  He is feeling good and notices a difference in how he is feeling. He is doing his home exercise and walking with the dog. He is doing his balance exercises.   Expected Outcomes Short: Use RPE daily to regulate intensity. Long: Follow program prescription in THR. Short:  attend consistently Long: build overall  stamina Short: Start to walk more on off days Long: Improve stamina Short: Increase incline on treadmill Long: Continue to increase overall MET level Short: Continue to add in more walking at home.  Long: Continue improve stamina    Row Name 01/20/21 1334 02/02/21 1319 02/16/21 1503 03/02/21 1023 03/16/21 1635     Exercise Goal Re-Evaluation   Exercise Goals Review Increase Physical Activity;Increase Strength and Stamina Increase Physical Activity;Increase Strength and Stamina;Understanding of Exercise Prescription Increase Physical Activity;Increase Strength and Stamina Increase Physical Activity;Increase Strength and Stamina Increase Physical Activity;Increase Strength and Stamina   Comments Ronald Roth continues to do well and has increased average MET level.  Staff will continue to monitor progress. Ronald Roth has been doing well in rehab.  He is on level 5 for the recumbent elliptical and level 7 on the XR.  He is feeling like his strength and stamina is getting better and has noticed that some days are better than others.  He was able to cut grass before class today.  He is also working on balance and stretching at home daily.  He has also been walking some days and plans to join a gym after graduation to continue to use treadmill. We will continue to monitor his progress. Ronald Roth has average MET level of 4.4.  He is progressing well with execise and reaches target HR range.  We will continue to monitor progress. Ronald Roth returned back to rehab after being on vacation for a week and had reported a lower HR, feeling fatigued and RN noted rhythm change on EKG strips upon return. Patient supposed to follow up with Dr. Rockey Roth after review of strips. He tolerated exercise during the session he was here. We will continue to monitor based on doctor's recommendations. Ronald Roth will graduate HT this week.  He did improve post 6 min walk.  He plans to exercise at home upon graduation.   Expected Outcomes Short: contineu to attend  consistently Long: build overall stamina Short: Try 6 lb hand weights LOng; continue to improve stamina Short: try 6 lb hand weights Long: build overall stamina Short: Follow up with doctor and continue exercise prescription Long: Continue to increase overall MET level ST/LT: complete HT and maintain exercise on his own            Discharge Exercise Prescription (Final Exercise Prescription Changes):  Exercise Prescription Changes - 03/16/21 1600       Response to  Exercise   Blood Pressure (Admit) 162/80    Blood Pressure (Exit) 158/76    Heart Rate (Admit) 73 bpm    Heart Rate (Exercise) 104 bpm    Heart Rate (Exit) 49 bpm    Rating of Perceived Exertion (Exercise) 13    Duration Continue with 30 min of aerobic exercise without signs/symptoms of physical distress.    Intensity THRR unchanged      Progression   Progression Continue to progress workloads to maintain intensity without signs/symptoms of physical distress.    Average METs 4.5      Resistance Training   Training Prescription Yes    Weight 5 lb    Reps 10-15      Interval Training   Interval Training No      Treadmill   MPH 2.7    Grade 0    Minutes 15    METs 3.01      REL-XR   Level 7    Minutes 15    METs 6      Home Exercise Plan   Plans to continue exercise at Home (comment)   walking, staff videos   Frequency Add 2 additional days to program exercise sessions.    Initial Home Exercises Provided 12/24/20      Oxygen   Maintain Oxygen Saturation 88% or higher             Nutrition:  Target Goals: Understanding of nutrition guidelines, daily intake of sodium <1537m, cholesterol <2035m calories 30% from fat and 7% or less from saturated fats, daily to have 5 or more servings of fruits and vegetables.  Education: All About Nutrition: -Group instruction provided by verbal, written material, interactive activities, discussions, models, and posters to present general guidelines for heart  healthy nutrition including fat, fiber, MyPlate, the role of sodium in heart healthy nutrition, utilization of the nutrition label, and utilization of this knowledge for meal planning. Follow up email sent as well. Written material given at graduation.   Biometrics:  Pre Biometrics - 12/16/20 1522       Pre Biometrics   Height 5' 10.75" (1.797 m)    Weight 196 lb 3.2 oz (89 kg)    BMI (Calculated) 27.56    Single Leg Stand 6.22 seconds             Post Biometrics - 03/04/21 1407        Post  Biometrics   Height 5' 10.75" (1.797 m)    Weight 207 lb 4.8 oz (94 kg)    BMI (Calculated) 29.12    Single Leg Stand 4.7 seconds             Nutrition Therapy Plan and Nutrition Goals:  Nutrition Therapy & Goals - 12/31/20 1610       Nutrition Therapy   Diet Heart healthy, low Na, diabetes friendly    Drug/Food Interactions Statins/Certain Fruits    Protein (specify units) 70g    Fiber 30 grams    Whole Grain Foods 3 servings    Saturated Fats 12 max. grams    Fruits and Vegetables 8 servings/day    Sodium 1.5 grams      Personal Nutrition Goals   Nutrition Goal ST: try out different recipes, focu on MyPlate LT: become more confident in meal planning    Comments B: piece of toast with butter to let him take medications (whole grain thin bread) with coffee ( creamer no sugar) L: hotdog and some chips or  [place] and cheese sandwich with mayo D: portions have reduced. Vegetable with a meat (grill, baked) - eats chicken more often. He reports liking his non-starchy vegetables S: lite yogurt with granola. Drinks: water with crystal lite. He liked to have cookies, but he stopped. He would like to focus on meal planning. He was thinking about having some healthy choice frozen meals just to have on hand - discussed quick meals like summer rolls with pre-cut vegetables, protein, and dip - this can easily put together - he was interested in trying that. Discussed heart healthy eating and  diabetes friendly eating.      Intervention Plan   Intervention Prescribe, educate and counsel regarding individualized specific dietary modifications aiming towards targeted core components such as weight, hypertension, lipid management, diabetes, heart failure and other comorbidities.;Nutrition handout(s) given to patient.    Expected Outcomes Short Term Goal: Understand basic principles of dietary content, such as calories, fat, sodium, cholesterol and nutrients.;Short Term Goal: A plan has been developed with personal nutrition goals set during dietitian appointment.;Long Term Goal: Adherence to prescribed nutrition plan.             Nutrition Assessments:  MEDIFICTS Score Key: ?70 Need to make dietary changes  40-70 Heart Healthy Diet ? 40 Therapeutic Level Cholesterol Diet  Flowsheet Row Cardiac Rehab from 03/16/2021 in [facility] Cardiac and Pulmonary Rehab  Picture Your Plate Total Score on Admission 54  Picture Your Plate Total Score on Discharge 57      Picture Your Plate Scores: <61 Unhealthy dietary pattern with much room for improvement. 41-50 Dietary pattern unlikely to meet recommendations for good health and room for improvement. 51-60 More healthful dietary pattern, with some room for improvement.  >60 Healthy dietary pattern, although there may be some specific behaviors that could be improved.    Nutrition Goals Re-Evaluation:  Nutrition Goals Re-Evaluation     Row Name 01/08/21 1338 02/02/21 1341 03/02/21 1413         Goals   Nutrition Goal ST: try out different recipes, focu on MyPlate LT: become more confident in meal planning ST: try out different recipes, focu on MyPlate LT: become more confident in meal planning --     Comment Ronald Roth had a good meeting last week.  He is reading food labels and staying away from transfat and saturated fats.  They always make sure that there are vegetables on the table. Ronald Roth is doing better with his diet. He continues to read  food labels and avoids transfat and limits his saturated fats.  They continue to aim for more vegetables. He does aim for a balanced diet.  He has now found that there is not a vegetable that he doesn't like.  He has baked brussell spouts last night. Ronald Roth continues to read labels and focus on eating vegetables.  He had a lot of baked asparagus with parmesan cheese yesterday.  He says he does "cheat"occasionally but overall eats healthy.     Expected Outcome Short: Try new recipes  Long; Continue to focus on healthy eating. Short: Continue to try new things Long: Continue to eat a balance diet Short: continue to read labels and eat lots of vegetables Long: maintain heart healthy diet              Nutrition Goals Discharge (Final Nutrition Goals Re-Evaluation):  Nutrition Goals Re-Evaluation - 03/02/21 1413       Goals   Comment Ronald Roth continues to read labels and focus on eating vegetables.  He had a lot of baked asparagus with parmesan cheese yesterday.  He says he does "cheat"occasionally but overall eats healthy.    Expected Outcome Short: continue to read labels and eat lots of vegetables Long: maintain heart healthy diet             Psychosocial: Target Goals: Acknowledge presence or absence of significant depression and/or stress, maximize coping skills, provide positive support system. Participant is able to verbalize types and ability to use techniques and skills needed for reducing stress and depression.   Education: Stress, Anxiety, and Depression - Group verbal and visual presentation to define topics covered.  Reviews how body is impacted by stress, anxiety, and depression.  Also discusses healthy ways to reduce stress and to treat/manage anxiety and depression.  Written material given at graduation.   Education: Sleep Hygiene -Provides group verbal and written instruction about how sleep can affect your health.  Define sleep hygiene, discuss sleep cycles and impact of sleep  habits. Review good sleep hygiene tips.    Initial Review & Psychosocial Screening:  Initial Psych Review & Screening - 12/08/20 1542       Initial Review   Current issues with Current Stress Concerns      Family Dynamics   Good Support System? Yes   wife, children, grandchildren     Barriers   Psychosocial barriers to participate in program There are no identifiable barriers or psychosocial needs.;The patient should benefit from training in stress management and relaxation.      Screening Interventions   Interventions Encouraged to exercise;Provide feedback about the scores to participant;To provide support and resources with identified psychosocial needs    Expected Outcomes Short Term goal: Utilizing psychosocial counselor, staff and physician to assist with identification of specific Stressors or current issues interfering with healing process. Setting desired goal for each stressor or current issue identified.;Long Term Goal: Stressors or current issues are controlled or eliminated.;Short Term goal: Identification and review with participant of any Quality of Life or Depression concerns found by scoring the questionnaire.;Long Term goal: The participant improves quality of Life and PHQ9 Scores as seen by post scores and/or verbalization of changes             Quality of Life Scores:   Quality of Life - 03/16/21 1434       Quality of Life Scores   Health/Function Pre 23.2 %    Health/Function Post 23.6 %    Health/Function % Change 1.72 %    Socioeconomic Pre 25.13 %    Socioeconomic Post 27 %    Socioeconomic % Change  7.44 %    Psych/Spiritual Pre 23.14 %    Psych/Spiritual Post 27.43 %    Psych/Spiritual % Change 18.54 %    Family Pre 27.6 %    Family Post 26.4 %    Family % Change -4.35 %    GLOBAL Pre 24.26 %    GLOBAL Post 25.54 %    GLOBAL % Change 5.28 %            Scores of 19 and below usually indicate a poorer quality of life in these areas.  A  difference of  2-3 points is a clinically meaningful difference.  A difference of 2-3 points in the total score of the Quality of Life Index has been associated with significant improvement in overall quality of life, self-image, physical symptoms, and general health in studies assessing change in quality of life.  PHQ-9: Recent Review  Flowsheet Data     Depression screen [facility] 2/9 03/16/2021 12/16/2020 11/21/2020 08/22/2020   Decreased Interest 0 0 0 0   Down, Depressed, Hopeless 1 0 0 0   PHQ - 2 Score 1 0 0 0   Altered sleeping 0 0 0 -   Tired, decreased energy 1 0 1 -   Change in appetite 0 0 0 -   Feeling bad or failure about yourself  1 0 0 -   Trouble concentrating 0 0 0 -   Moving slowly or fidgety/restless 0 0 0 -   Suicidal thoughts 0 0 0 -   PHQ-9 Score 3 0 1 -   Difficult doing work/chores Not difficult at all Not difficult at all Not difficult at all -      Interpretation of Total Score  Total Score Depression Severity:  1-4 = Minimal depression, 5-9 = Mild depression, 10-14 = Moderate depression, 15-19 = Moderately severe depression, 20-27 = Severe depression   Psychosocial Evaluation and Intervention:  Psychosocial Evaluation - 12/08/20 1554       Psychosocial Evaluation & Interventions   Interventions Encouraged to exercise with the program and follow exercise prescription    Comments 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 He states he is doing well post op and starting to get back to his usual routine. He is retired from [employer] and likes to stay active.  He and his wife have 6 daughters and 2 sons that live out of town but are very supportive. He made some big lifestyle changes when he had his stents years ago. He and his wife like to kayak and be active so he is ready to get back to that in a safe way. He did report some anxiety/stress from the surgery where he felt the need to make videos for his children in case he didn't make it. He states  that he didn't want them to know about it but he is starting to feel more confident now that its been weeks post op. His hope is this program will help build on his foundation of a heart healthy lifestyle    Expected Outcomes Short: attend cardiac rehab for education and exericse. Long: develop and maintain positive self care habits.    Continue Psychosocial Services  Follow up required by staff             Psychosocial Re-Evaluation:  Psychosocial Re-Evaluation     Row Name 01/08/21 1335 02/02/21 1346 03/02/21 1342         Psychosocial Re-Evaluation   Current issues with Current Stress Concerns Current Stress Concerns Current Stress Concerns     Comments Ronald Roth is doing well in rehab. He has good days and great day!  He always tries to focus on the positive things. His anxiiety is getting better as he gets more information and being able to do more and more each day. He sleeps very well most nights. Ronald Roth is doing well in rehab.  He about two weeks out from his youngest daughter getted married in [place].  He is said that he is just ready for it to be over. His futrue son in law works for [employer] up there and she is working for the court system.  He continues to sleep well.  He has a new puppy at home that they are working on training. Ronald Roth says they are stil training the puppy.  It helps keep him active loooking  after the puppy.  Wedding went well.     Expected Outcomes Short: Continue to exercise for mental boost Long: Continue to focus on positive Short; Get tjhrough wedding in a couple of weeks Long: Continue to focus on positive Short: continue to attend [facility] for the exercise Long:  maintain positive outlook     Interventions Encouraged to attend Cardiac Rehabilitation for the exercise Encouraged to attend Cardiac Rehabilitation for the exercise Encouraged to attend Cardiac Rehabilitation for the exercise     Continue Psychosocial Services  Follow up required by staff -- --               Psychosocial Discharge (Final Psychosocial Re-Evaluation):  Psychosocial Re-Evaluation - 03/02/21 1342       Psychosocial Re-Evaluation   Current issues with Current Stress Concerns    Comments Ronald Roth says they are stil training the puppy.  It helps keep him active loooking after the puppy.  Wedding went well.    Expected Outcomes Short: continue to attend [facility] for the exercise Long:  maintain positive outlook    Interventions Encouraged to attend Cardiac Rehabilitation for the exercise             Vocational Rehabilitation: Provide vocational rehab assistance to qualifying candidates.   Vocational Rehab Evaluation & Intervention:  Vocational Rehab - 12/08/20 1542       Initial Vocational Rehab Evaluation & Intervention   Assessment shows need for Vocational Rehabilitation No             Education: Education Goals: Education classes will be provided on a variety of topics geared toward better understanding of heart health and risk factor modification. Participant will state understanding/return demonstration of topics presented as noted by education test scores.  Learning Barriers/Preferences:  Learning Barriers/Preferences - 12/08/20 1541       Learning Barriers/Preferences   Learning Barriers None    Learning Preferences None             General Cardiac Education Topics:  AED/CPR: - Group verbal and written instruction with the use of models to demonstrate the basic use of the AED with the basic ABC's of resuscitation.   Anatomy and Cardiac Procedures: - Group verbal and visual presentation and models provide information about basic cardiac anatomy and function. Reviews the testing methods done to diagnose heart disease and the outcomes of the test results. Describes the treatment choices: Medical Management, Angioplasty, or Coronary Bypass Surgery for treating various heart conditions including Myocardial Infarction, Angina, Valve Disease, and  Cardiac Arrhythmias.  Written material given at graduation. Flowsheet Row Cardiac Rehab from 03/18/2021 in [facility] Cardiac and Pulmonary Rehab  Date 01/14/21  Educator Operating Room Services  Instruction Review Code 1- Verbalizes Understanding       Medication Safety: - Group verbal and visual instruction to review commonly prescribed medications for heart and lung disease. Reviews the medication, class of the drug, and side effects. Includes the steps to properly store meds and maintain the prescription regimen.  Written material given at graduation.   Intimacy: - Group verbal instruction through game format to discuss how heart and lung disease can affect sexual intimacy. Written material given at graduation.. Flowsheet Row Cardiac Rehab from 03/18/2021 in [facility] Cardiac and Pulmonary Rehab  Date 01/07/21  Educator AS  Instruction Review Code 1- Verbalizes Understanding       Know Your Numbers and Heart Failure: - Group verbal and visual instruction to discuss disease risk factors for cardiac and pulmonary disease and  treatment options.  Reviews associated critical values for Overweight/Obesity, Hypertension, Cholesterol, and Diabetes.  Discusses basics of heart failure: signs/symptoms and treatments.  Introduces Heart Failure Zone chart for action plan for heart failure.  Written material given at graduation.   Infection Prevention: - Provides verbal and written material to individual with discussion of infection control including proper hand washing and proper equipment cleaning during exercise session. Flowsheet Row Cardiac Rehab from 03/18/2021 in [facility] Cardiac and Pulmonary Rehab  Education need identified 12/16/20  Date 12/16/20  Educator [place]  Instruction Review Code 1- Verbalizes Understanding       Falls Prevention: - Provides verbal and written material to individual with discussion of falls prevention and safety. Flowsheet Row Cardiac Rehab from 03/18/2021 in [facility] Cardiac and Pulmonary Rehab   Education need identified 12/16/20  Date 12/16/20  Educator [place]  Instruction Review Code 1- Verbalizes Understanding       Other: -Provides group and verbal instruction on various topics (see comments)   Knowledge Questionnaire Score:  Knowledge Questionnaire Score - 03/16/21 1434       Knowledge Questionnaire Score   Pre Score 24/26: Angina, Exercise    Post Score 26/26             Core Components/Risk Factors/Patient Goals at Admission:  Personal Goals and Risk Factors at Admission - 12/16/20 1546       Core Components/Risk Factors/Patient Goals on Admission    Weight Management Yes;Weight Loss    Intervention Weight Management: Develop a combined nutrition and exercise program designed to reach desired caloric intake, while maintaining appropriate intake of nutrient and fiber, sodium and fats, and appropriate energy expenditure required for the weight goal.;Weight Management: Provide education and appropriate resources to help participant work on and attain dietary goals.;Weight Management/Obesity: Establish reasonable short term and long term weight goals.    Admit Weight 196 lb (88.9 kg)    Goal Weight: Short Term 191 lb (86.6 kg)    Goal Weight: Long Term 186 lb (84.4 kg)    Expected Outcomes Short Term: Continue to assess and modify interventions until short term weight is achieved;Long Term: Adherence to nutrition and physical activity/exercise program aimed toward attainment of established weight goal;Weight Loss: Understanding of general recommendations for a balanced deficit meal plan, which promotes 1-2 lb weight loss per week and includes a negative energy balance of [phone] kcal/d;Understanding recommendations for meals to include 15-35% energy as protein, 25-35% energy from fat, 35-60% energy from carbohydrates, less than 232m of dietary cholesterol, 20-35 gm of total fiber daily;Understanding of distribution of calorie intake throughout the day with the  consumption of 4-5 meals/snacks    Diabetes Yes    Intervention Provide education about signs/symptoms and action to take for hypo/hyperglycemia.;Provide education about proper nutrition, including hydration, and aerobic/resistive exercise prescription along with prescribed medications to achieve blood glucose in normal ranges: Fasting glucose 65-99 mg/dL    Hypertension Yes    Intervention Provide education on lifestyle modifcations including regular physical activity/exercise, weight management, moderate sodium restriction and increased consumption of fresh fruit, vegetables, and low fat dairy, alcohol moderation, and smoking cessation.;Monitor prescription use compliance.    Expected Outcomes Short Term: Continued assessment and intervention until BP is < 140/957mHG in hypertensive participants. < 130/8093mG in hypertensive participants with diabetes, heart failure or chronic kidney disease.;Long Term: Maintenance of blood pressure at goal levels.    Lipids Yes    Intervention Provide education and support for participant on nutrition & aerobic/resistive exercise  along with prescribed medications to achieve LDL <61m, HDL >448m             Education:Diabetes - Individual verbal and written instruction to review signs/symptoms of diabetes, desired ranges of glucose level fasting, after meals and with exercise. Acknowledge that pre and post exercise glucose checks will be done for 3 sessions at entry of program. [facility]  Education need identified 12/16/20  Date 12/16/20  Educator KLCalpellaInstruction Review Code 1- Verbalizes Understanding       Core Components/Risk Factors/Patient Goals Review:   Goals and Risk Factor Review     Row Name 01/08/21 1340 02/02/21 1344 03/02/21 1337         Core Components/Risk Factors/Patient Goals Review   Personal Goals Review Weight Management/Obesity;Hypertension;Diabetes;Lipids  Weight Management/Obesity;Hypertension;Diabetes;Lipids Weight Management/Obesity;Hypertension;Diabetes     Review BoMikki Santees holding steady on his weight and keeping his weight under 200 lb.  He has kept it down for the last 6-7 years.  Blood pressures are doing well in class. He doesn't check them at home as he feels they are doing well.  His blood sugars have been good and he does check them at home routinely. BoMikki Santees doing well in rehab.  His weight is staying between 196-199 lb. He is pleased with his progress and continues to work to get it down.  Blood pressures and sugars continue to do well. Overall, feeling pretty good. Ronald Roth's weight is up to 205 today.  He has a Dr appointment Friday to follow up.  He took copies of EKG to Dr last week as it has changed.  He says he can feel he is "off".  BP has been good when he checks at home.  His HR has been low on home BP machine.  His sugars are ok at home.  Staff advised him to call 911 if symptoms worsen before he sees Dr.     ExNoberto Retortutcomes Short: Continue to monitor blood sugars Long: Continue to montior risk factors. Short: Continue to work on weight loss Long: Continue to monitor risk factors. Short: follow up with Dr LoLaverta Baltimoremanage risk factors long term              Core Components/Risk Factors/Patient Goals at Discharge (Final Review):   Goals and Risk Factor Review - 03/02/21 1337       Core Components/Risk Factors/Patient Goals Review   Personal Goals Review Weight Management/Obesity;Hypertension;Diabetes    Review Ronald Roth's weight is up to 205 today.  He has a Dr appointment Friday to follow up.  He took copies of EKG to Dr last week as it has changed.  He says he can feel he is "off".  BP has been good when he checks at home.  His HR has been low on home BP machine.  His sugars are ok at home.  Staff advised him to call 911 if symptoms worsen before he sees Dr.    ExNoberto Retortutcomes Short: follow up with Dr LoLaverta Baltimoremanage risk factors long term              ITP Comments:  ITP Comments     Row Name 12/08/20 1539 12/16/20 1513 12/17/20 1524 12/24/20 0744 12/31/20 1609   ITP Comments Initial telephone orientation completed. [facility] EP orientation scheduled for Tuesday 8/2 at 10am. Completed 6MWT and gym orientation. Initial ITP created and sent for review to Dr. MaElta Guadeloupe  Sabra Heck, Medical Director. First full day of exercise!  Patient was oriented to gym and equipment including functions, settings, policies, and procedures.  Patient's individual exercise prescription and treatment plan were reviewed.  All starting workloads were established based on the results of the 6 minute walk test done at initial orientation visit.  The plan for exercise progression was also introduced and progression will be customized based on patient's performance and goals. 30 Day review completed. Medical Director ITP review done, changes made as directed, and signed approval by Medical Director.    New to program Completed initial RD consultation    Row Name 01/21/21 0644 02/18/21 0939 03/02/21 1341 03/18/21 0745 03/19/21 1333   ITP Comments 30 Day review completed. Medical Director ITP review done, changes made as directed, and signed approval by Medical Director. 30 day review completed. ITP sent to Dr. Emily Filbert, Medical Director of Cardiac Rehab. Continue with ITP unless changes are made by physician. Has doctor appointment Friday, 10/21, concerning fluctuating heart rate and change in rhythm. Continues to be asymptomatic. 30 Day review completed. Medical Director ITP review done, changes made as directed, and signed approval by Medical Director. Reuben graduated today from  rehab with 36 sessions completed.  Details of the patient's exercise prescription and what He needs to do in order to continue the prescription and progress were discussed with patient.  Patient was given a copy of prescription and goals.  Patient verbalized  understanding.  Jhoel plans to continue to exercise by walking and using staff videos.            Comments: discharge ITP

## 2021-03-19 NOTE — Progress Notes (Signed)
Daily Session Note  Patient Details  Name: Ronald Roth MRN: [id number] Date of Birth: [birth year] Referring Provider:   Flowsheet Row Cardiac Rehab from 12/16/2020 in [facility] Cardiac and Pulmonary Rehab  Referring Provider [name] MD       Encounter Date: 03/19/2021  Check In:  Session Check In - 03/19/21 1331       Check-In   Supervising physician immediately available to respond to emergencies See telemetry face sheet for immediately available ER MD    Location [facility]-Cardiac & Pulmonary Rehab    Staff Present Renita Papa, RN BSN;Joseph Meridian, RCP,RRT,BSRT;Jessica [place], [place], RCEP, CCRP, CCET    Virtual Visit No    Medication changes reported     No    Fall or balance concerns reported    No    Warm-up and Cool-down Performed on first and last piece of equipment    Resistance Training Performed Yes    VAD Patient? No    PAD/SET Patient? No      Pain Assessment   Currently in Pain? No/denies                Social History   Tobacco Use  Smoking Status Former   Packs/day: 1.00   Years: 30.00   Pack years: 30.00   Types: Cigarettes   Quit date: 04/18/1991   Years since quitting: 29.9  Smokeless Tobacco Never    Goals Met:  Independence with exercise equipment Exercise tolerated well No report of concerns or symptoms today Strength training completed today  Goals Unmet:  Not Applicable  Comments:  Jodi graduated today from  rehab with 36 sessions completed.  Details of the patient's exercise prescription and what He needs to do in order to continue the prescription and progress were discussed with patient.  Patient was given a copy of prescription and goals.  Patient verbalized understanding.  Bradely plans to continue to exercise by walking and using staff videos.    Dr. Emily Filbert is Medical Director for [place].  Dr. Ottie Glazier is Medical Director for [facility] Pulmonary Rehabilitation.

## 2021-03-19 NOTE — Telephone Encounter (Signed)
Requested Prescriptions  Pending Prescriptions Disp Refills  . OZEMPIC, 0.25 OR 0.5 MG/DOSE, 2 MG/1.5ML SOPN [Pharmacy Med Name: Ozempic (0.25 or 0.5 MG/DOSE) 2 MG/1.5ML Subcutaneous Solution Pen-injector] 4.5 mL 1    Sig: INJECT SUBCUTANEOUSLY 0.5  MG EVERY WEEK     Endocrinology:  Diabetes - GLP-1 Receptor Agonists Passed - 03/18/2021 10:20 PM      Passed - HBA1C is between 0 and 7.9 and within 180 days    Hemoglobin A1C  Date Value Ref Range Status  01/29/2020 7.3  Final    Comment[facility]   Hgb A1c MFr Bld  Date Value Ref Range Status  11/19/2020 7.3 (H) 4.8 - 5.6 % Final    Comment:             Prediabetes: 5.7 - 6.4          Diabetes: >6.4          Glycemic control for adults with diabetes: <7.0          Passed - Valid encounter within last 6 months    Recent Outpatient Visits          3 months ago Type 2 diabetes, controlled, with neuropathy [facility])   [facility] Smitty Cords, DO   6 months ago Type 2 diabetes, controlled, with neuropathy [facility])   [facility] Smitty Cords, DO      Future Appointments            In 5 days Gollan, Tollie Pizza, MD [facility], [facility]   In 1 week Althea Charon, Netta Neat, DO [facility] [place], [facility]   In 2 months Gollan, Tollie Pizza, MD [facility], [facility]

## 2021-03-23 NOTE — Progress Notes (Signed)
Cardiology Office Note  Date:  03/24/2021   ID:  Ronald Roth, DOB Sep 08, 1950, MRN [id number]  PCP:  Ronald Cords, DO   Chief Complaint  Patient presents with   Follow-up    Follow up and medications verbally reviewed with patient.      HPI:  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  CABG 10/2020 Who presents for follow-up of his coronary artery disease coronary disease  Last seen in clinic by myself August 2022 Participating in cardiac rehab  On last clinic visit SOB, fatigue recently, came on acutely,, started oct 15-16th at a wedding Slow rate noted at cardiac rehab,  completed cardiac rehab "Doubled exercise capacity", no angina  BP well controlled at home systolic 145  Stress test reviewed on today's visit showing predominant fixed inferior wall defect, other regions of perfusion defect improved  Lab work reviewed from September 2021 Creatinine 1.23 Total cholesterol 164 LDL 102 A1c 7.3  EKG personally reviewed by myself on todays visit NSR with 2:1 AV block  Other past cardiac history reviewed Previous symptoms of angina leading to stress test showing large region of ischemia, Followed by cardiac catheterization October 20, 2020 results as below Severe three-vessel coronary artery disease, including long segment of diffuse mid LAD disease of 50-90% stenosis with moderate to severe associated calcification and occlusion of branch of D1, small LCx with chronic total occlusion of OM1, and large RCA with 70-90% tandem lesions involving rPDA and 60% rPL stenosis. Patient rPL stent with mild in-stent restenosis (~20%). Upper normal left ventricular filling pressure (LVEDP ~15 mmHg).   Referred for CABG Seen in [place], Underwent CABG October 31, 2020   (LIMA to LAD, SVG to DIAGONAL, SVG to PDA and PLB),  Zio monitor   avg HR of 96 bpm. Sinus Rhythm.   3 Supraventricular Tachycardia runs occurred, the run with the fastest interval lasting 13 beats with a max rate of 193 bpm, the longest lasting 19.3 secs with an avg rate of 117 bpm.   Isolated SVEs were rare (<1.0%), SVE Couplets were rare (<1.0%), and SVE Triplets were rare (<1.0%). Isolated VEs were occasional  (1.6%, 30479), VE Couplets were rare (<1.0%, 3115), and VE Triplets were rare (<1.0%, 1).   PMH:   has a past medical history of Anxiety, Coronary artery disease, Diabetes mellitus without complication (HCC), GERD (gastroesophageal reflux disease), Heart attack (HCC) (2012), Hypertension, and Sciatica.  PSH:    Past Surgical History:  Procedure Laterality Date   CARDIAC CATHETERIZATION     CORONARY ANGIOPLASTY WITH STENT PLACEMENT  2012   x 1   CORONARY ARTERY BYPASS GRAFT N/A 10/31/2020   Procedure: CORONARY ARTERY BYPASS GRAFTING (CABG)x 4 ON CARDIOPULMONARY BYPASS. LEFT INTERNAL MAMMARY ARTERY AND RIGHT GREATER SAPHENOUS VEIN. LIMA TO LAD, SVG TO DIAG., SVG TO PDA  AND PLB;  Surgeon: Linden Dolin, MD;  Location: [facility] OR;  Service: Open Heart Surgery;  Laterality: N/A;   ENDOVEIN HARVEST OF GREATER SAPHENOUS VEIN Right 10/31/2020   Procedure: ENDOVEIN HARVEST OF GREATER SAPHENOUS VEIN;  Surgeon: Linden Dolin, MD;  Location: [facility] OR;  Service: Open Heart Surgery;  Laterality: Right;   LEFT HEART CATH AND CORONARY ANGIOGRAPHY N/A 10/20/2020   Procedure: LEFT HEART CATH AND CORONARY ANGIOGRAPHY;  Surgeon: Yvonne Kendall, MD;  Location: [facility] INVASIVE  CV LAB;  Service: Cardiovascular;  Laterality: N/A;   Ruptured disc     Sciatica    STENT PLACEMENT VASCULAR ([facility] HX)  2012   TEE WITHOUT CARDIOVERSION N/A 10/31/2020   Procedure: TRANSESOPHAGEAL ECHOCARDIOGRAM (TEE);  Surgeon: Linden Dolin, MD;  Location: [facility] OR;  Service: Open Heart Surgery;  Laterality: N/A;    Current  Outpatient Medications on File Prior to Visit  Medication Sig Dispense Refill   acetaminophen (TYLENOL) 500 MG tablet Take 2 tablets (1,000 mg total) by mouth every 6 (six) hours as needed. 30 tablet 0   aspirin EC 81 MG tablet Take 81 mg by mouth in the morning. Swallow whole.     atorvastatin (LIPITOR) 80 MG tablet Take 1 tablet (80 mg total) by mouth in the morning. Take 80 mg by mouth in the morning. 90 tablet 3   diphenhydrAMINE (BENADRYL) 25 MG tablet Take 75 mg by mouth at bedtime.     ezetimibe (ZETIA) 10 MG tablet Take 1 tablet (10 mg total) by mouth in the morning. 90 tablet 3   fenofibrate (TRICOR) 145 MG tablet Take 145 mg by mouth in the morning.     furosemide (LASIX) 20 MG tablet Take 1 tablet (20 mg total) by mouth daily. 30 tablet 3   JARDIANCE 25 MG TABS tablet Take 1 tablet (25 mg total) by mouth daily before breakfast. 90 tablet 3   metoprolol succinate (TOPROL-XL) 50 MG 24 hr tablet Take 1 tablet (50 mg total) by mouth daily. Take with or immediately following a meal. 90 tablet 3   nitroGLYCERIN (NITROSTAT) 0.4 MG SL tablet Place 1 tablet (0.4 mg total) under the tongue every 5 (five) minutes x 3 doses as needed for chest pain. 25 tablet 3   omeprazole (PRILOSEC) 20 MG capsule Take 20 mg by mouth in the morning.     OZEMPIC, 0.25 OR 0.5 MG/DOSE, 2 MG/1.5ML SOPN INJECT SUBCUTANEOUSLY 0.5  MG EVERY WEEK 4.5 mL 1   potassium chloride (KLOR-CON) 10 MEQ tablet Take 1 tablet (10 mEq total) by mouth daily. 30 tablet 3   sacubitril-valsartan (ENTRESTO) 24-26 MG Take 1 tablet by mouth 2 (two) times daily. 60 tablet 11   Vitamin D, Ergocalciferol, (DRISDOL) 1.25 MG (50000 UNIT) CAPS capsule Take 50,000 Units by mouth 2 (two) times a week. Sundays & Thursdays     No current facility-administered medications on file prior to visit.    Allergies:   Patient has no known allergies.   Social History:  The patient  reports that he quit smoking about 29 years ago. His smoking use included  cigarettes. He has a 30.00 pack-year smoking history. He has never used smokeless tobacco. He reports current alcohol use. He reports that he does not use drugs.   Family History:   family history includes Aortic aneurysm in his father; Arrhythmia in his mother; Hyperlipidemia in his sister; Hypertension in his sister; Pneumonia in his mother.    Review of Systems: Review of Systems  Constitutional: Negative.   HENT: Negative.    Respiratory: Negative.    Cardiovascular: Negative.   Gastrointestinal: Negative.   Musculoskeletal: Negative.   Neurological: Negative.   Psychiatric/Behavioral: Negative.    All other systems reviewed and are negative.   PHYSICAL EXAM: VS:  BP (!) 172/72 (BP Location: Left Arm, Patient Position: Sitting, Cuff Size: Normal)   Pulse (!) 46   Ht 5' 10.5" (1.791 m)   Wt 200 lb (90.7 kg)   SpO2 98%  BMI 28.29 kg/m  , BMI Body mass index is 28.29 kg/m. Constitutional:  oriented to person, place, and time. No distress.  HENT:  Head: Grossly normal Eyes:  no discharge. No scleral icterus.  Neck: No JVD, no carotid bruits  Cardiovascular: Regular rate and rhythm, no murmurs appreciated Pulmonary/Chest: Clear to auscultation bilaterally, no wheezes or rails Abdominal: Soft.  no distension.  no tenderness.  Musculoskeletal: Normal range of motion Neurological:  normal muscle tone. Coordination normal. No atrophy Skin: Skin warm and dry Psychiatric: normal affect, pleasant  Recent Labs: 11/01/2020: Magnesium 2.1 11/19/2020: ALT 9; TSH 1.110 03/06/2021: B Natriuretic Peptide 618.2; BUN 17; Creatinine, Ser 1.14; Hemoglobin 14.5; Platelets 195; Potassium 4.2; Sodium 141    Lipid Panel Lab Results  Component Value Date   CHOL 135 11/19/2020   HDL 30 (L) 11/19/2020   LDLCALC 79 11/19/2020   TRIG 149 11/19/2020      Wt Readings from Last 3 Encounters:  03/24/21 200 lb (90.7 kg)  03/06/21 208 lb 6 oz (94.5 kg)  03/04/21 207 lb 4.8 oz (94 kg)      ASSESSMENT AND PLAN:  Problem List Items Addressed This Visit       Cardiology Problems   Cardiomyopathy [facility])   Coronary artery disease involving native coronary artery of native heart without angina pectoris - Primary   Other Visit Diagnoses     S/P CABG x 4       AV heart block       Shortness of breath       Palpitations       Essential hypertension       Hyperlipidemia LDL goal <70         Cad: Discussed stress test, denies anginal symptoms, fixed inferior wall defect Medical management recommended  Essential hypertension Hold the metoprolol succinate, due to 2:1 AV block noted on EKG starting 3 weeks ago with general malaise bradycardia He does have a ZIO monitor in place Suspect will need to hold beta-blockers indefinitely Increase Entresto to 49/51 mg twice daily  Shortness of breath Somewhat current shortness of breath in the setting of 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 No changes to the medications were made.  2:1 AV block Hold metoprolol succinate 50 He currently has a ZIO monitor and placed due to see him back in 3 days Recommend after that time he closely monitor heart rate, call us if it remains low Will likely need to continue hold of beta-blockers  Cardiomyopathy Holding beta-blocker secondary to AV block today Continue Jardiance, increase Entresto dosing, continue Lasix with potassium    Total encounter time more than 25 minutes  Greater than 50% was spent in counseling and coordination of care with the patient    Signed, Dossie Arbour, M.D., Ph.D. [facility] [place], [place] [phone]

## 2021-03-24 ENCOUNTER — Encounter: Payer: Self-pay | Admitting: Cardiovascular Disease

## 2021-03-24 ENCOUNTER — Ambulatory Visit (INDEPENDENT_AMBULATORY_CARE_PROVIDER_SITE_OTHER): Payer: Managed Care, Other (non HMO) | Admitting: Cardiovascular Disease

## 2021-03-24 ENCOUNTER — Other Ambulatory Visit: Payer: Self-pay

## 2021-03-24 VITALS — BP 172/72 | HR 46 | Ht 70.5 in | Wt 200.0 lb

## 2021-03-24 DIAGNOSIS — R002 Palpitations: Secondary | ICD-10-CM

## 2021-03-24 DIAGNOSIS — I2510 Atherosclerotic heart disease of native coronary artery without angina pectoris: Secondary | ICD-10-CM | POA: Diagnosis not present

## 2021-03-24 DIAGNOSIS — Z951 Presence of aortocoronary bypass graft: Secondary | ICD-10-CM | POA: Diagnosis not present

## 2021-03-24 DIAGNOSIS — I10 Essential (primary) hypertension: Secondary | ICD-10-CM | POA: Diagnosis not present

## 2021-03-24 DIAGNOSIS — I4430 Unspecified atrioventricular block: Secondary | ICD-10-CM | POA: Diagnosis not present

## 2021-03-24 DIAGNOSIS — I200 Unstable angina: Secondary | ICD-10-CM

## 2021-03-24 DIAGNOSIS — I255 Ischemic cardiomyopathy: Secondary | ICD-10-CM

## 2021-03-24 DIAGNOSIS — R0602 Shortness of breath: Secondary | ICD-10-CM

## 2021-03-24 DIAGNOSIS — E785 Hyperlipidemia, unspecified: Secondary | ICD-10-CM

## 2021-03-24 MED ORDER — ENTRESTO 49-51 MG PO TABS: 1.0000 | ORAL_TABLET | Freq: Two times a day (BID) | ORAL | 3 refills | Status: DC

## 2021-03-24 NOTE — Patient Instructions (Addendum)
Medication Instructions:  Please STOP  metoprolol  Please INCREASE  Entresto up to 49/51 mg twice a day  Monitor blood pressure, call the office with numbers (or mychart)  If you need a refill on your cardiac medications before your next appointment, please call your pharmacy.    Lab work: No new labs needed  Testing/Procedures: We will call you with zio results, will see on MyChart, but you will receive a call to explain results.   Follow-Up: At [facility], you and your health needs are our priority.  As part of our continuing mission to provide you with exceptional heart care, we have created designated Provider Care Teams.  These Care Teams include your primary Cardiologist (physician) and Advanced Practice Providers (APPs -  Physician Assistants and Nurse Practitioners) who all work together to provide you with the care you need, when you need it.  You will need a follow up appointment in 3 months  Providers on your designated Care Team:   Nicolasa Ducking, NP Eula Listen, PA-C Cadence Fransico [place], [place]   COVID-19 Vaccine Information can be found at: [email or web address] For questions related to vaccine distribution or appointments, please email [email or web address] or call [phone].   Please monitor blood pressures and keep a log of your readings.  Call the clinic in 2-3 weeks with BP readings or upload to MyChart  How to use a home blood pressure monitor. Be still. Measure at the same time every day. It's important to take the readings at the same time each day, such as morning and evening. Take reading approximately 1 1/2 to 2 hours after BP medications.   AVOID these things for 30 minutes before checking your blood pressure: Drinking caffeine. Drinking alcohol. Eating. Smoking. Exercising.

## 2021-03-30 ENCOUNTER — Encounter: Payer: Self-pay | Admitting: Family Medicine

## 2021-03-30 ENCOUNTER — Other Ambulatory Visit: Payer: Self-pay

## 2021-03-30 ENCOUNTER — Ambulatory Visit (INDEPENDENT_AMBULATORY_CARE_PROVIDER_SITE_OTHER): Payer: Medicare Other | Admitting: Family Medicine

## 2021-03-30 VITALS — BP 148/49 | HR 41 | Ht 70.5 in | Wt 201.4 lb

## 2021-03-30 DIAGNOSIS — I2510 Atherosclerotic heart disease of native coronary artery without angina pectoris: Secondary | ICD-10-CM | POA: Diagnosis not present

## 2021-03-30 DIAGNOSIS — E1169 Type 2 diabetes mellitus with other specified complication: Secondary | ICD-10-CM

## 2021-03-30 DIAGNOSIS — Z23 Encounter for immunization: Secondary | ICD-10-CM | POA: Diagnosis not present

## 2021-03-30 DIAGNOSIS — E1140 Type 2 diabetes mellitus with diabetic neuropathy, unspecified: Secondary | ICD-10-CM

## 2021-03-30 DIAGNOSIS — Z1211 Encounter for screening for malignant neoplasm of colon: Secondary | ICD-10-CM

## 2021-03-30 DIAGNOSIS — E559 Vitamin D deficiency, unspecified: Secondary | ICD-10-CM

## 2021-03-30 DIAGNOSIS — Z951 Presence of aortocoronary bypass graft: Secondary | ICD-10-CM

## 2021-03-30 DIAGNOSIS — E785 Hyperlipidemia, unspecified: Secondary | ICD-10-CM

## 2021-03-30 LAB — POCT GLYCOSYLATED HEMOGLOBIN (HGB A1C): Hemoglobin A1C: 7.1 % — AB (ref 4.0–5.6)

## 2021-03-30 MED ORDER — VITAMIN D3 125 MCG (5000 UT) PO CAPS: 5000.0000 [IU] | ORAL_CAPSULE | Freq: Every day | ORAL | 3 refills | Status: AC

## 2021-03-30 NOTE — Assessment & Plan Note (Signed)
Well-controlled DM with A1c stable 7.1 Complications - hyperlipidemia, vascular disease PAD   Plan:  1. Continue current therapy - Ozempic 0.5mg  weekly, Jardiance 25mg  daily - no change today, consider future dose inc Ozempic to 1mg  if indicated. 2. Encourage improved lifestyle - low carb, low sugar diet, reduce portion size, continue improving regular exercise 3. Check CBG , bring log to next visit for review 4. Continue ASA, ARB, Statin 5. Advised to schedule DM ophtho exam, send record

## 2021-03-30 NOTE — Patient Instructions (Addendum)
Thank you for coming to the office today.  Recent Labs    10/29/20 1433 11/19/20 1016 03/30/21 1021  HGBA1C 7.3* 7.3* 7.1*    Vitamin D3 5,000 unit daily sent to [facility]  Keep up with Dr [name] with heart issues right now.   Please schedule a Follow-up Appointment to: Return in about 6 months (around 09/27/2021) for 6 month fasting [facility] order (msg before) and 1 week later [employer].  If you have any other questions or concerns, please feel free to call the office or send a message through MyChart. You may also schedule an earlier appointment if necessary.  Additionally, you may be receiving a survey about your experience at our office within a few days to 1 week by e-mail or mail. We value your feedback.  Saralyn Pilar, DO [facility], [place]

## 2021-03-30 NOTE — Assessment & Plan Note (Signed)
Followed by Cardiology Dr [name] 2:1 AV block Zio patch Off Betablocker now due to symptomatic bradycardia episodes On Entresto, Lasix

## 2021-03-30 NOTE — Progress Notes (Signed)
Subjective:    Patient ID: Ronald Roth, male    DOB: [birth year], 70 y.o.   MRN: [id number]  Ronald Roth is a 70 y.o. male presenting on 03/30/2021 for Diabetes   HPI  CHRONIC DM, Type 2: Hyperlipidemia History of DM since 2013, previously managed by Endocrinology Last visit with me 3 month ago, he was having intolerance to metformin, discontinued on Synjardy and switch to Jardiance monotherapy and Ozempic instead of Bydureon Seems to be doing very well. Recent update A1c stable at 7.3 Meds: Ozempic 0.5mg  weekly inj, Jardiance 25mg  daily Currently on ACEi Resolved diarrhea Denies hypoglycemia, polyuria, visual changes, numbness or tingling.   CAD s/p CABG Followed by CVD  Dr Rockey Situ, has had CT and Nuclear stress tests Prior MI in 2013, angioplasty, stent. Established with [facility] proceeded with stress testing, Heart Cath and ultimately coordinated with CVTS / Cardiology for CABG. Reviewed records. His medications have been managed by Cardiology, he has had repeat ECHO with reduced LVEF 35%, awaiting repeat ECHO in 3 months now for improvement in function. Has followed by Cardiac Rehab. He does report some changes with new overnight low HR. Last seen 03/24/21 with Cardiology - he was having 2:1 AV block on EKG and also symptomatic bradycardia, he was taken off Beta Blocker now. He doubled dose increased Entresto. Zio patch in place Continues Lasix 20mg  with potassium 10 daily  Vitamin D Deficiency He is on Vitamin D 50k twice a week, now asking for daily dose. Has benefit on this with improved Vit D level and helped immunity.  Health Maintenance:  Due for Flu Shot, will receive today.  Due for Colon CA Screening - ordered Colonoscopy screening. No prior screening, referred to [facility]  Depression screen [facility] 2/9 03/16/2021 12/16/2020 11/21/2020  Decreased Interest 0 0 0  Down, Depressed, Hopeless 1 0 0  PHQ - 2 Score 1 0 0  Altered sleeping 0 0 0  Tired,  decreased energy 1 0 1  Change in appetite 0 0 0  Feeling bad or failure about yourself  1 0 0  Trouble concentrating 0 0 0  Moving slowly or fidgety/restless 0 0 0  Suicidal thoughts 0 0 0  PHQ-9 Score 3 0 1  Difficult doing work/chores Not difficult at all Not difficult at all Not difficult at all    Social History   Tobacco Use   Smoking status: Former    Packs/day: 1.00    Years: 30.00    Pack years: 30.00    Types: Cigarettes    Quit date: 04/18/1991    Years since quitting: 29.9   Smokeless tobacco: Former    Types: Chew    Quit date: 04/16/1993  Vaping Use   Vaping Use: Never used  Substance Use Topics   Alcohol use: Yes    Comment: Occasionally    Drug use: Never    Review of Systems  Constitutional:  Negative for activity change, appetite change, chills, diaphoresis, fatigue and fever.  HENT:  Negative for congestion and hearing loss.   Eyes:  Negative for visual disturbance.  Respiratory:  Negative for cough, chest tightness, shortness of breath and wheezing.   Cardiovascular:  Negative for chest pain, palpitations and leg swelling.  Gastrointestinal:  Negative for abdominal pain, constipation, diarrhea, nausea and vomiting.  Genitourinary:  Negative for dysuria, frequency and hematuria.  Musculoskeletal:  Negative for arthralgias and neck pain.  Skin:  Negative for rash.  Neurological:  Negative for dizziness, weakness, light-headedness, numbness  and headaches.  Hematological:  Negative for adenopathy.  Psychiatric/Behavioral:  Negative for behavioral problems, dysphoric mood and sleep disturbance.   Per HPI unless specifically indicated above     Objective:    BP (!) 148/49   Pulse (!) 41   Ht 5' 10.5" (1.791 m)   Wt 201 lb 6.4 oz (91.4 kg)   SpO2 100%   BMI 28.49 kg/m   Wt Readings from Last 3 Encounters:  03/30/21 201 lb 6.4 oz (91.4 kg)  03/24/21 200 lb (90.7 kg)  03/06/21 208 lb 6 oz (94.5 kg)    Physical Exam Vitals and nursing note  reviewed.  Constitutional:      General: He is not in acute distress.    Appearance: He is well-developed. He is not diaphoretic.     Comments: Well-appearing, comfortable, cooperative  HENT:     Head: Normocephalic and atraumatic.  Eyes:     General:        Right eye: No discharge.        Left eye: No discharge.     Conjunctiva/sclera: Conjunctivae normal.     Pupils: Pupils are equal, round, and reactive to light.  Neck:     Thyroid: No thyromegaly.  Cardiovascular:     Rate and Rhythm: Normal rate and regular rhythm.     Pulses: Normal pulses.     Heart sounds: Normal heart sounds. No murmur heard. Pulmonary:     Effort: Pulmonary effort is normal. No respiratory distress.     Breath sounds: Normal breath sounds. No wheezing or rales.  Abdominal:     General: Bowel sounds are normal. There is no distension.     Palpations: Abdomen is soft. There is no mass.     Tenderness: There is no abdominal tenderness.  Musculoskeletal:        General: No tenderness. Normal range of motion.     Cervical back: Normal range of motion and neck supple.     Comments: Upper / Lower Extremities: - Normal muscle tone, strength bilateral upper extremities 5/5, lower extremities 5/5  Lymphadenopathy:     Cervical: No cervical adenopathy.  Skin:    General: Skin is warm and dry.     Findings: No erythema or rash.  Neurological:     Mental Status: He is alert and oriented to person, place, and time.     Comments: Distal sensation intact to light touch all extremities  Psychiatric:        Mood and Affect: Mood normal.        Behavior: Behavior normal.        Thought Content: Thought content normal.     Comments: Well groomed, good eye contact, normal speech and thoughts    Nuclear Stress Test 03/13/21   Abnormal pharmacologic myocardial perfusion stress test.   There is a moderate in size, severe, fixed inferior wall and apical lateral defect consistent with scar.   No significant ischemia  is identified.   Left ventricular systolic function is mildly reduced (LVEF 45-50%) with inferior hypokinesis.   Attenuation correction CT demonstrates coronary artery calcification and aortic atherosclerosis as well as post CABG findings.   This is an intermediate risk study.    Results for orders placed or performed in visit on 03/30/21  POCT HgB A1C  Result Value Ref Range   Hemoglobin A1C 7.1 (A) 4.0 - 5.6 %      Assessment & Plan:   Problem List Items Addressed This Visit  Type 2 diabetes, controlled, with neuropathy (HCC) - Primary    Well-controlled DM with A1c stable 7.1 Complications - hyperlipidemia, vascular disease PAD   Plan:  1. Continue current therapy - Ozempic 0.5mg  weekly, Jardiance 25mg  daily - no change today, consider future dose inc Ozempic to 1mg  if indicated. 2. Encourage improved lifestyle - low carb, low sugar diet, reduce portion size, continue improving regular exercise 3. Check CBG , bring log to next visit for review 4. Continue ASA, ARB, Statin 5. Advised to schedule DM ophtho exam, send record      Relevant Orders   POCT HgB A1C (Completed)   S/P CABG (coronary artery bypass graft)   Hyperlipidemia associated with type 2 diabetes mellitus (HCC)   Coronary artery disease involving native coronary artery of native heart without angina pectoris    Followed by Cardiology Dr 2:1 AV block Zio patch Off Betablocker now due to symptomatic bradycardia episodes On Entresto, Lasix      Other Visit Diagnoses     Needs flu shot       Relevant Orders   Flu Vaccine QUAD High Dose(Fluad) (Completed)   Screening for colon cancer       Relevant Orders   Ambulatory referral to Gastroenterology   Vitamin D deficiency       Relevant Medications   Cholecalciferol (VITAMIN D3) 125 MCG (5000 UT) CAPS       Vit D Def Order VitD3 5k daily  Orders Placed This Encounter  Procedures   Flu Vaccine QUAD High Dose(Fluad)   Ambulatory referral to  Gastroenterology    Referral Priority:   Routine    Referral Type:   Consultation    Referral Reason:   Specialty Services Required    Number of Visits Requested:   1   POCT HgB A1C      Orders Placed This Encounter  Procedures   Flu Vaccine QUAD High Dose(Fluad)   Ambulatory referral to Gastroenterology    Referral Priority:   Routine    Referral Type:   Consultation    Referral Reason:   Specialty Services Required    Number of Visits Requested:   1   POCT HgB A1C     Meds ordered this encounter  Medications   Cholecalciferol (VITAMIN D3) 125 MCG (5000 UT) CAPS    Sig: Take 1 capsule (5,000 Units total) by mouth daily.    Dispense:  90 capsule    Refill:  3       Follow up plan: Return in about 6 months (around 09/27/2021) for 6 month fasting [facility] order (msg before) and 1 week later [name].   09/29/2021, DO [facility] 03/30/2021, 10:20 AM

## 2021-04-06 ENCOUNTER — Telehealth: Payer: Self-pay | Admitting: Physician Assistant

## 2021-04-06 NOTE — Telephone Encounter (Signed)
   IRhythm reported that pt had some asystole and complete heart block, episodes ranging from 3-11 sec. No sx reported.  This happened on November the 6th.  Theodore Demark, PA-C 04/06/2021 7:42 PM,

## 2021-04-07 ENCOUNTER — Telehealth: Payer: Self-pay | Admitting: Cardiovascular Disease

## 2021-04-07 NOTE — Telephone Encounter (Signed)
Spoke with patient and his wife to advise monitor did show some concerns and that we would like him to be seen on 04/15/21 at 08:40 am here in our office with Dr. Lalla Brothers. Reviewed that if between now and his appointment if he should pass out then he would need to seek care in ED. They verbalized understanding, was agreeable with appointment, and had no further questions at this time.    Contacted Meredith with Zio to request additional rhythm strips associated with the additional pauses noted on the monitor. She will have those sent over for our review.

## 2021-04-07 NOTE — Telephone Encounter (Signed)
Zio called in critical monitor results on this patient. Report received and reviewed by primary cardiology here in our office. Provider requested that I enter allergy with notation of AV dysfunction and pauses with betablocker use. Provider requested I call Zio to obtain strip from 03/26/21 at around noon as it was not in the report. Reached out to Zio rep to gather that information. Dr. Lalla Brothers reviewed results as well and appointment provided for patient to come in and see him with instructions to seek care at ED if he should pass out. Dr. Lovena Neighbours nurse advised they would see him on the 30th then possibly set him up for pacemaker placement on December 1st.   Attempted to reach patient. Left voicemail message to call back for review of information.

## 2021-04-13 ENCOUNTER — Encounter: Payer: Self-pay | Admitting: Cardiovascular Disease

## 2021-04-13 NOTE — Telephone Encounter (Signed)
Pam, RN spoke w/pt via phone on 11/22 to review ZIO resutls Appt made with Dr. Lalla Brothers 11/30 (Wed) at 08:40 am to review ZIO results

## 2021-04-14 NOTE — H&P (View-Only) (Signed)
Electrophysiology Office Note:    Date:  04/15/2021   ID:  Ronald Roth, DOB 12/04/1950, MRN [id number]  PCP:  Karamalegos, Alexander J, DO  [facility] Cardiologist:  None  [facility] Electrophysiologist:  Shakiah Wester T Lorris Carducci, MD   Referring MD: Karamalegos, Alexander *   Chief Complaint: AV block  History of Present Illness:    Ronald Roth is a 70 y.o. male who presents for an evaluation of AV block at the request of Dr Karamalegos. Their medical history includes CAD, DM, HTN. He has previously undergone CABG in 10/2020.  He saw Dr. Gollan on March 24, 2021.  At that appointment his beta-blockers were held because of 2-1 AV block.  A heart monitor was ordered which showed an 11.4-second pause with complete heart block.  He denies syncope today.  He is fairly active.  He tells me sometimes he will feel a brief sensation where lights go bright and he is unsteady for 1 second.  He did have an episode while driving where he had near syncope and had to sit down in his car.  He ultimately sought care and was ruled out for pulmonary embolism.     Past Medical History:  Diagnosis Date   Anxiety    Coronary artery disease    Diabetes mellitus without complication (HCC)    GERD (gastroesophageal reflux disease)    Heart attack (HCC) 2012   Hypertension    Sciatica     Past Surgical History:  Procedure Laterality Date   CARDIAC CATHETERIZATION     CORONARY ANGIOPLASTY WITH STENT PLACEMENT  2012   x 1   CORONARY ARTERY BYPASS GRAFT N/A 10/31/2020   Procedure: CORONARY ARTERY BYPASS GRAFTING (CABG)x 4 ON CARDIOPULMONARY BYPASS. LEFT INTERNAL MAMMARY ARTERY AND RIGHT GREATER SAPHENOUS VEIN. LIMA TO LAD, SVG TO DIAG., SVG TO PDA  AND PLB;  Surgeon: Atkins, Broadus Z, MD;  Location: [facility] OR;  Service: Open Heart Surgery;  Laterality: N/A;   ENDOVEIN HARVEST OF GREATER SAPHENOUS VEIN Right 10/31/2020   Procedure: ENDOVEIN HARVEST OF GREATER SAPHENOUS VEIN;  Surgeon: Atkins, Broadus Z,  MD;  Location: [facility] OR;  Service: Open Heart Surgery;  Laterality: Right;   LEFT HEART CATH AND CORONARY ANGIOGRAPHY N/A 10/20/2020   Procedure: LEFT HEART CATH AND CORONARY ANGIOGRAPHY;  Surgeon: End, Christopher, MD;  Location: [facility] INVASIVE CV LAB;  Service: Cardiovascular;  Laterality: N/A;   Ruptured disc     Sciatica    STENT PLACEMENT VASCULAR ([facility] HX)  2012   TEE WITHOUT CARDIOVERSION N/A 10/31/2020   Procedure: TRANSESOPHAGEAL ECHOCARDIOGRAM (TEE);  Surgeon: Atkins, Broadus Z, MD;  Location: [facility] OR;  Service: Open Heart Surgery;  Laterality: N/A;    Current Medications: Current Meds  Medication Sig   acetaminophen (TYLENOL) 500 MG tablet Take 2 tablets (1,000 mg total) by mouth every 6 (six) hours as needed.   aspirin EC 81 MG tablet Take 81 mg by mouth in the morning. Swallow whole.   atorvastatin (LIPITOR) 80 MG tablet Take 1 tablet (80 mg total) by mouth in the morning. Take 80 mg by mouth in the morning.   Cholecalciferol (VITAMIN D3) 125 MCG (5000 UT) CAPS Take 1 capsule (5,000 Units total) by mouth daily.   diphenhydrAMINE (BENADRYL) 25 MG tablet Take 75 mg by mouth at bedtime.   ezetimibe (ZETIA) 10 MG tablet Take 1 tablet (10 mg total) by mouth in the morning.   fenofibrate (TRICOR) 145 MG tablet Take 145 mg by mouth in the morning.     furosemide (LASIX) 20 MG tablet Take 1 tablet (20 mg total) by mouth daily.   JARDIANCE 25 MG TABS tablet Take 1 tablet (25 mg total) by mouth daily before breakfast.   nitroGLYCERIN (NITROSTAT) 0.4 MG SL tablet Place 1 tablet (0.4 mg total) under the tongue every 5 (five) minutes x 3 doses as needed for chest pain.   omeprazole (PRILOSEC) 20 MG capsule Take 20 mg by mouth in the morning.   OZEMPIC, 0.25 OR 0.5 MG/DOSE, 2 MG/1.5ML SOPN INJECT SUBCUTANEOUSLY 0.5  MG EVERY WEEK   potassium chloride (KLOR-CON) 10 MEQ tablet Take 1 tablet (10 mEq total) by mouth daily.   sacubitril-valsartan (ENTRESTO) 49-51 MG Take 1 tablet by mouth 2 (two) times daily.      Allergies:   Beta adrenergic blockers   Social History   Socioeconomic History   Marital status: Married    Spouse name: Not on file   Number of children: Not on file   Years of education: Not on file   Highest education level: Not on file  Occupational History   Not on file  Tobacco Use   Smoking status: Former    Packs/day: 1.00    Years: 30.00    Pack years: 30.00    Types: Cigarettes    Quit date: 04/18/1991    Years since quitting: 30.0   Smokeless tobacco: Former    Types: Chew    Quit date: 04/16/1993  Vaping Use   Vaping Use: Never used  Substance and Sexual Activity   Alcohol use: Yes    Comment: Occasionally    Drug use: Never   Sexual activity: Not on file  Other Topics Concern   Not on file  Social History Narrative   Not on file   Social Determinants of Health   Financial Resource Strain: Not on file  Food Insecurity: Not on file  Transportation Needs: Not on file  Physical Activity: Not on file  Stress: Not on file  Social Connections: Not on file     Family History: The patient's family history includes Aortic aneurysm in his father; Arrhythmia in his mother; Hyperlipidemia in his sister; Hypertension in his sister; Pneumonia in his mother.  ROS:   Please see the history of present illness.    All other systems reviewed and are negative.  EKGs/Labs/Other Studies Reviewed:    The following studies were reviewed today:  04/07/2021 Monitor personally reviewed High degree AV block, 11 second pause   EKG:  The ekg ordered today demonstrates sinus bradycardia with 2-1 AV block.  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   Recent Labs: 11/01/2020: Magnesium 2.1 11/19/2020: ALT 9; TSH 1.110 03/06/2021: B Natriuretic Peptide 618.2; BUN 17; Creatinine, Ser 1.14; Hemoglobin 14.5; Platelets 195; Potassium 4.2; Sodium 141  Recent Lipid Panel    Component Value Date/Time   CHOL 135 11/19/2020 1016   TRIG 149  11/19/2020 1016   HDL 30 (L) 11/19/2020 1016   CHOLHDL 4.5 11/19/2020 1016   LDLCALC 79 11/19/2020 1016    Physical Exam:    VS:  BP (!) 150/74 (BP Location: Left Arm, Patient Position: Sitting, Cuff Size: Normal)   Pulse (!) 38   Ht 5' 10.5" (1.791 m)   Wt 202 lb (91.6 kg)   SpO2 97%   BMI 28.57 kg/m     Wt Readings from Last 3 Encounters:  04/15/21 202 lb (91.6 kg)  03/30/21 201 lb 6.4 oz (91.4 kg)  03/24/21 200 lb (90.7 kg)     GEN:  Well nourished, well developed in no acute distress HEENT: Normal NECK: No JVD; No carotid bruits LYMPHATICS: No lymphadenopathy CARDIAC: RRR, no murmurs, rubs, gallops RESPIRATORY:  Clear to auscultation without rales, wheezing or rhonchi  ABDOMEN: Soft, non-tender, non-distended MUSCULOSKELETAL:  No edema; No deformity  SKIN: Warm and dry NEUROLOGIC:  Alert and oriented x 3 PSYCHIATRIC:  Normal affect       ASSESSMENT:    1. Coronary artery disease involving native coronary artery of native heart without angina pectoris   2. Heart block AV complete (HCC)   3. Symptomatic bradycardia   4. S/P CABG x 4   5. Ischemic cardiomyopathy   6. HFrEF (heart failure with reduced ejection fraction) (HCC)    PLAN:    In order of problems listed above:  #Complete heart block, advanced conduction system disease Symptomatic in the past.  Ventricular rate is in the 30s.  I do believe a permanent pacemaker is indicated because of progressively worsening conduction system disease and a history of presyncope.  I would recommend implanting a dual-chamber permanent pacemaker with left bundle area pacing lead.  We will plan for a [employer] device.  I discussed the procedure in detail include the risks with the patient and his wife and they wish to proceed.  Risks, benefits, alternatives to PPM implantation were discussed in detail with the patient today. The patient understands that the risks include but are not limited to bleeding, infection,  pneumothorax, perforation, tamponade, vascular damage, renal failure, MI, stroke, death, and lead dislodgement and wishes to proceed.  We will therefore schedule device implantation at the next available time.  #Chronic systolic heart failure Ejection fraction at the time of bypass surgery was 20 to 30%. Will repeat echocardiogram prior to permanent pacemaker implant.  If it is still low, we will need to change the procedure to a CRT-D implant.  I discussed this with the patient today.  #Coronary artery disease post bypass surgery No ischemic symptoms.    Total time spent with patient today 65 minutes. This includes reviewing records, evaluating the patient and coordinating care.  Medication Adjustments/Labs and Tests Ordered: Current medicines are reviewed at length with the patient today.  Concerns regarding medicines are outlined above.  Orders Placed This Encounter  Procedures   Basic Metabolic Panel (BMET)   CBC w/Diff   EKG 12-Lead   ECHOCARDIOGRAM COMPLETE   No orders of the defined types were placed in this encounter.    Signed, Shandy Checo T. Abuk Selleck, MD, FACC, FHRS 04/15/2021 9:30 AM    Electrophysiology [facility]  

## 2021-04-14 NOTE — Progress Notes (Signed)
Electrophysiology Office Note:    Date:  04/15/2021   ID:  Ronald Roth, DOB [birth year], MRN [id number]  PCP:  Smitty Cords, DO  [facility] Cardiologist:  None  [facility] Electrophysiologist:  Lanier Prude, MD   Referring MD: Saralyn Pilar *   Chief Complaint: AV block  History of Present Illness:    Ronald Roth is a 70 y.o. male who presents for an evaluation of AV block at the request of Dr Althea Charon. Their medical history includes CAD, DM, HTN. He has previously undergone CABG in 10/2020.  He saw Dr. [name] on March 24, 2021.  At that appointment his beta-blockers were held because of 2-1 AV block.  A heart monitor was ordered which showed an 11.4-second pause with complete heart block.  He denies syncope today.  He is fairly active.  He tells me sometimes he will feel a brief sensation where lights go bright and he is unsteady for 1 second.  He did have an episode while driving where he had near syncope and had to sit down in his car.  He ultimately sought care and was ruled out for pulmonary embolism.     Past Medical History:  Diagnosis Date   Anxiety    Coronary artery disease    Diabetes mellitus without complication (HCC)    GERD (gastroesophageal reflux disease)    Heart attack (HCC) 2012   Hypertension    Sciatica     Past Surgical History:  Procedure Laterality Date   CARDIAC CATHETERIZATION     CORONARY ANGIOPLASTY WITH STENT PLACEMENT  2012   x 1   CORONARY ARTERY BYPASS GRAFT N/A 10/31/2020   Procedure: CORONARY ARTERY BYPASS GRAFTING (CABG)x 4 ON CARDIOPULMONARY BYPASS. LEFT INTERNAL MAMMARY ARTERY AND RIGHT GREATER SAPHENOUS VEIN. LIMA TO LAD, SVG TO DIAG., SVG TO PDA  AND PLB;  Surgeon: Linden Dolin, MD;  Location: [facility] OR;  Service: Open Heart Surgery;  Laterality: N/A;   ENDOVEIN HARVEST OF GREATER SAPHENOUS VEIN Right 10/31/2020   Procedure: ENDOVEIN HARVEST OF GREATER SAPHENOUS VEIN;  Surgeon: Linden Dolin,  MD;  Location: [facility] OR;  Service: Open Heart Surgery;  Laterality: Right;   LEFT HEART CATH AND CORONARY ANGIOGRAPHY N/A 10/20/2020   Procedure: LEFT HEART CATH AND CORONARY ANGIOGRAPHY;  Surgeon: Yvonne Kendall, MD;  Location: [facility] INVASIVE CV LAB;  Service: Cardiovascular;  Laterality: N/A;   Ruptured disc     Sciatica    STENT PLACEMENT VASCULAR ([facility] HX)  2012   TEE WITHOUT CARDIOVERSION N/A 10/31/2020   Procedure: TRANSESOPHAGEAL ECHOCARDIOGRAM (TEE);  Surgeon: Linden Dolin, MD;  Location: [facility] OR;  Service: Open Heart Surgery;  Laterality: N/A;    Current Medications: Current Meds  Medication Sig   acetaminophen (TYLENOL) 500 MG tablet Take 2 tablets (1,000 mg total) by mouth every 6 (six) hours as needed.   aspirin EC 81 MG tablet Take 81 mg by mouth in the morning. Swallow whole.   atorvastatin (LIPITOR) 80 MG tablet Take 1 tablet (80 mg total) by mouth in the morning. Take 80 mg by mouth in the morning.   Cholecalciferol (VITAMIN D3) 125 MCG (5000 UT) CAPS Take 1 capsule (5,000 Units total) by mouth daily.   diphenhydrAMINE (BENADRYL) 25 MG tablet Take 75 mg by mouth at bedtime.   ezetimibe (ZETIA) 10 MG tablet Take 1 tablet (10 mg total) by mouth in the morning.   fenofibrate (TRICOR) 145 MG tablet Take 145 mg by mouth in the morning.  furosemide (LASIX) 20 MG tablet Take 1 tablet (20 mg total) by mouth daily.   JARDIANCE 25 MG TABS tablet Take 1 tablet (25 mg total) by mouth daily before breakfast.   nitroGLYCERIN (NITROSTAT) 0.4 MG SL tablet Place 1 tablet (0.4 mg total) under the tongue every 5 (five) minutes x 3 doses as needed for chest pain.   omeprazole (PRILOSEC) 20 MG capsule Take 20 mg by mouth in the morning.   OZEMPIC, 0.25 OR 0.5 MG/DOSE, 2 MG/1.5ML SOPN INJECT SUBCUTANEOUSLY 0.5  MG EVERY WEEK   potassium chloride (KLOR-CON) 10 MEQ tablet Take 1 tablet (10 mEq total) by mouth daily.   sacubitril-valsartan (ENTRESTO) 49-51 MG Take 1 tablet by mouth 2 (two) times daily.      Allergies:   Beta adrenergic blockers   Social History   Socioeconomic History   Marital status: Married    Spouse name: Not on file   Number of children: Not on file   Years of education: Not on file   Highest education level: Not on file  Occupational History   Not on file  Tobacco Use   Smoking status: Former    Packs/day: 1.00    Years: 30.00    Pack years: 30.00    Types: Cigarettes    Quit date: 04/18/1991    Years since quitting: 30.0   Smokeless tobacco: Former    Types: Chew    Quit date: 04/16/1993  Vaping Use   Vaping Use: Never used  Substance and Sexual Activity   Alcohol use: Yes    Comment: Occasionally    Drug use: Never   Sexual activity: Not on file  Other Topics Concern   Not on file  Social History Narrative   Not on file   Social Determinants of Health   Financial Resource Strain: Not on file  Food Insecurity: Not on file  Transportation Needs: Not on file  Physical Activity: Not on file  Stress: Not on file  Social Connections: Not on file     Family History: The patient's family history includes Aortic aneurysm in his father; Arrhythmia in his mother; Hyperlipidemia in his sister; Hypertension in his sister; Pneumonia in his mother.  ROS:   Please see the history of present illness.    All other systems reviewed and are negative.  EKGs/Labs/Other Studies Reviewed:    The following studies were reviewed today:  04/07/2021 Monitor personally reviewed High degree AV block, 11 second pause   EKG:  The ekg ordered today demonstrates sinus bradycardia with 2-1 AV block.  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   Recent Labs: 11/01/2020: Magnesium 2.1 11/19/2020: ALT 9; TSH 1.110 03/06/2021: B Natriuretic Peptide 618.2; BUN 17; Creatinine, Ser 1.14; Hemoglobin 14.5; Platelets 195; Potassium 4.2; Sodium 141  Recent Lipid Panel    Component Value Date/Time   CHOL 135 11/19/2020 1016   TRIG 149  11/19/2020 1016   HDL 30 (L) 11/19/2020 1016   CHOLHDL 4.5 11/19/2020 1016   LDLCALC 79 11/19/2020 1016    Physical Exam:    VS:  BP (!) 150/74 (BP Location: Left Arm, Patient Position: Sitting, Cuff Size: Normal)   Pulse (!) 38   Ht 5' 10.5" (1.791 m)   Wt 202 lb (91.6 kg)   SpO2 97%   BMI 28.57 kg/m     Wt Readings from Last 3 Encounters:  04/15/21 202 lb (91.6 kg)  03/30/21 201 lb 6.4 oz (91.4 kg)  03/24/21 200 lb (90.7 kg)     GEN:  Well nourished, well developed in no acute distress HEENT: Normal NECK: No JVD; No carotid bruits LYMPHATICS: No lymphadenopathy CARDIAC: RRR, no murmurs, rubs, gallops RESPIRATORY:  Clear to auscultation without rales, wheezing or rhonchi  ABDOMEN: Soft, non-tender, non-distended MUSCULOSKELETAL:  No edema; No deformity  SKIN: Warm and dry NEUROLOGIC:  Alert and oriented x 3 PSYCHIATRIC:  Normal affect       ASSESSMENT:    1. Coronary artery disease involving native coronary artery of native heart without angina pectoris   2. Heart block AV complete (HCC)   3. Symptomatic bradycardia   4. S/P CABG x 4   5. Ischemic cardiomyopathy   6. HFrEF (heart failure with reduced ejection fraction) (HCC)    PLAN:    In order of problems listed above:  #Complete heart block, advanced conduction system disease Symptomatic in the past.  Ventricular rate is in the 30s.  I do believe a permanent pacemaker is indicated because of progressively worsening conduction system disease and a history of presyncope.  I would recommend implanting a dual-chamber permanent pacemaker with left bundle area pacing lead.  We will plan for a [employer] device.  I discussed the procedure in detail include the risks with the patient and his wife and they wish to proceed.  Risks, benefits, alternatives to PPM implantation were discussed in detail with the patient today. The patient understands that the risks include but are not limited to bleeding, infection,  pneumothorax, perforation, tamponade, vascular damage, renal failure, MI, stroke, death, and lead dislodgement and wishes to proceed.  We will therefore schedule device implantation at the next available time.  #Chronic systolic heart failure Ejection fraction at the time of bypass surgery was 20 to 30%. Will repeat echocardiogram prior to permanent pacemaker implant.  If it is still low, we will need to change the procedure to a CRT-D implant.  I discussed this with the patient today.  #Coronary artery disease post bypass surgery No ischemic symptoms.    Total time spent with patient today 65 minutes. This includes reviewing records, evaluating the patient and coordinating care.  Medication Adjustments/Labs and Tests Ordered: Current medicines are reviewed at length with the patient today.  Concerns regarding medicines are outlined above.  Orders Placed This Encounter  Procedures   Basic Metabolic Panel (BMET)   CBC w/Diff   EKG 12-Lead   ECHOCARDIOGRAM COMPLETE   No orders of the defined types were placed in this encounter.    Signed, Rossie Muskrat. Lalla Brothers, MD, [facility], [facility] 04/15/2021 9:30 AM    Electrophysiology [facility]

## 2021-04-15 ENCOUNTER — Ambulatory Visit (INDEPENDENT_AMBULATORY_CARE_PROVIDER_SITE_OTHER): Payer: Managed Care, Other (non HMO) | Admitting: Cardiology

## 2021-04-15 ENCOUNTER — Other Ambulatory Visit: Payer: Self-pay

## 2021-04-15 ENCOUNTER — Encounter: Payer: Self-pay | Admitting: Cardiology

## 2021-04-15 VITALS — BP 150/74 | HR 38 | Ht 70.5 in | Wt 202.0 lb

## 2021-04-15 DIAGNOSIS — I442 Atrioventricular block, complete: Secondary | ICD-10-CM | POA: Diagnosis not present

## 2021-04-15 DIAGNOSIS — I2510 Atherosclerotic heart disease of native coronary artery without angina pectoris: Secondary | ICD-10-CM | POA: Diagnosis not present

## 2021-04-15 DIAGNOSIS — R001 Bradycardia, unspecified: Secondary | ICD-10-CM

## 2021-04-15 DIAGNOSIS — Z951 Presence of aortocoronary bypass graft: Secondary | ICD-10-CM | POA: Diagnosis not present

## 2021-04-15 DIAGNOSIS — I255 Ischemic cardiomyopathy: Secondary | ICD-10-CM

## 2021-04-15 DIAGNOSIS — I5020 Unspecified systolic (congestive) heart failure: Secondary | ICD-10-CM

## 2021-04-15 NOTE — Patient Instructions (Addendum)
Medication Instructions:  Your physician recommends that you continue on your current medications as directed. Please refer to the Current Medication list given to you today. *If you need a refill on your cardiac medications before your next appointment, please call your pharmacy*  Lab Work: You will get lab work today:  CBC and BMP If you have labs (blood work) drawn today and your tests are completely normal, you will receive your results only by: MyChart Message (if you have MyChart) OR A paper copy in the mail If you have any lab test that is abnormal or we need to change your treatment, we will call you to review the results.  Testing/Procedures:  Your physician has requested that you have an echocardiogram. Echocardiography is a painless test that uses sound waves to create images of your heart. It provides your doctor with information about the size and shape of your heart and how well your heart's chambers and valves are working. This procedure takes approximately one hour. There are no restrictions for this procedure.  Please schedule for ECHO  Your physician has recommended that you have a pacemaker inserted. A pacemaker is a small device that is placed under the skin of your chest or abdomen to help control abnormal heart rhythms. This device uses electrical pulses to prompt the heart to beat at a normal rate. Pacemakers are used to treat heart rhythms that are too slow. Wire (leads) are attached to the pacemaker that goes into the chambers of you heart. This is done in the hospital and usually requires and overnight stay. Please see the instruction sheet given to you today for more information.  Follow-Up:  Pacemaker implant:  April 24, 2021   10-14 day follow up with device clinic 91 day follow up with Dr. Lalla Brothers  Pacemaker Implantation, Adult Pacemaker implantation is a procedure to place a pacemaker inside the chest. A pacemaker is a small computer that sends electrical  signals to the heart and helps the heart beat normally. A pacemaker also stores information about heart rhythms. You may need pacemaker implantation if you have: A slow heartbeat (bradycardia). Loss of consciousness that happens repeatedly (syncope) or repeated episodes of dizziness or light-headedness because of an irregular heart rate. Shortness of breath (dyspnea) due to heart problems. The pacemaker usually attaches to your heart through a wire called a lead. One or two leads may be needed. There are different types of pacemakers: Transvenous pacemaker. This type is placed under the skin or muscle of your upper chest area. The lead goes through a vein in the chest area to reach the inside of the heart. Epicardial pacemaker. This type is placed under the skin or muscle of your chest or abdomen. The lead goes through your chest to the outside of the heart. Tell a health care provider about: Any allergies you have. All medicines you are taking, including vitamins, herbs, eye drops, creams, and over-the-counter medicines. Any problems you or family members have had with anesthetic medicines. Any blood or bone disorders you have. Any surgeries you have had. Any medical conditions you have. Whether you are pregnant or may be pregnant. What are the risks? Generally, this is a safe procedure. However, problems may occur, including: Infection. Bleeding. Failure of the pacemaker or the lead. Collapse of a lung or bleeding into a lung. Blood clot inside a blood vessel with a lead. Damage to the heart. Infection inside the heart (endocarditis). Allergic reactions to medicines. What happens before the procedure? Staying hydrated  Follow instructions from your health care provider about hydration, which may include: Up to 2 hours before the procedure - you may continue to drink clear liquids, such as water, clear fruit juice, black coffee, and plain tea.  Eating and drinking restrictions Follow  instructions from your health care provider about eating and drinking, which may include: 8 hours before the procedure - stop eating heavy meals or foods, such as meat, fried foods, or fatty foods. 6 hours before the procedure - stop eating light meals or foods, such as toast or cereal. 6 hours before the procedure - stop drinking milk or drinks that contain milk. 2 hours before the procedure - stop drinking clear liquids. Medicines Ask your health care provider about: Changing or stopping your regular medicines. This is especially important if you are taking diabetes medicines or blood thinners. Taking medicines such as aspirin and ibuprofen. These medicines can thin your blood. Do not take these medicines unless your health care provider tells you to take them. Taking over-the-counter medicines, vitamins, herbs, and supplements. Tests You may have: A heart evaluation. This may include: An electrocardiogram (ECG). This involves placing patches on your skin to check your heart rhythm. A chest X-ray. An echocardiogram. This is a test that uses sound waves (ultrasound) to produce an image of the heart. A cardiac rhythm monitor. This is used to record your heart rhythm and any events for a longer period of time. Blood tests. Genetic testing. General instructions Do not use any products that contain nicotine or tobacco for at least 4 weeks before the procedure. These products include cigarettes, e-cigarettes, and chewing tobacco. If you need help quitting, ask your health care provider. Ask your health care provider: How your surgery site will be marked. What steps will be taken to help prevent infection. These steps may include: Removing hair at the surgery site. Washing skin with a germ-killing soap. Receiving antibiotic medicine. Plan to have someone take you home from the hospital or clinic. If you will be going home right after the procedure, plan to have someone with you for 24  hours. What happens during the procedure? An IV will be inserted into one of your veins. You will be given one or more of the following: A medicine to help you relax (sedative). A medicine to numb the area (local anesthetic). A medicine to make you fall asleep (general anesthetic). The next steps vary depending on the type of pacemaker you will be getting. If you are getting a transvenous pacemaker: An incision will be made in your upper chest. A pocket will be made for the pacemaker. It may be placed under the skin or between layers of muscle. The lead will be inserted into a blood vessel that goes to the heart. While X-rays are taken by an imaging machine (fluoroscopy), the lead will be advanced through the vein to the inside of your heart. The other end of the lead will be tunneled under the skin and attached to the pacemaker. If you are getting an epicardial pacemaker: An incision will be made near your ribs or breastbone (sternum) for the lead. The lead will be attached to the outside of your heart. Another incision will be made in your chest or upper abdomen to create a pocket for the pacemaker. The free end of the lead will be tunneled under the skin and attached to the pacemaker. The transvenous or epicardial pacemaker will be tested. Imaging studies may be done to check the lead position. The  incisions will be closed with stitches (sutures), adhesive strips, or skin glue. Bandages (dressings) will be placed over the incisions. The procedure may vary among health care providers and hospitals. What happens after the procedure? Your blood pressure, heart rate, breathing rate, and blood oxygen level will be monitored until you leave the hospital or clinic. You may be given antibiotics. You will be given pain medicine. An ECG and chest X-rays will be done. You may need to wear a continuous type of ECG (Holter monitor) to check your heart rhythm. Your health care provider will program  the pacemaker. If you were given a sedative during the procedure, it can affect you for several hours. Do not drive or operate machinery until your health care provider says that it is safe. You will be given a pacemaker identification card. This card lists the implant date, device model, and manufacturer of your pacemaker. Summary A pacemaker is a small computer that sends electrical signals to the heart and helps the heart beat normally. There are different types of pacemakers. A pacemaker may be placed under the skin or muscle of your chest or abdomen. Follow instructions from your health care provider about eating and drinking and about taking medicines before the procedure. This information is not intended to replace advice given to you by your health care provider. Make sure you discuss any questions you have with your health care provider. Document Revised: 01/13/2021 Document Reviewed: 04/04/2019 Elsevier Patient Education  2022 [employer].

## 2021-04-16 LAB — CBC WITH DIFFERENTIAL/PLATELET
Basophils Absolute: 0.1 10*3/uL (ref 0.0–0.2)
Basos: 1 %
EOS (ABSOLUTE): 0.3 10*3/uL (ref 0.0–0.4)
Eos: 3 %
Hematocrit: 44.2 % (ref 37.5–51.0)
Hemoglobin: 15 g/dL (ref 13.0–17.7)
Immature Grans (Abs): 0 10*3/uL (ref 0.0–0.1)
Immature Granulocytes: 0 %
Lymphocytes Absolute: 3.1 10*3/uL (ref 0.7–3.1)
Lymphs: 39 %
MCH: 29.8 pg (ref 26.6–33.0)
MCHC: 33.9 g/dL (ref 31.5–35.7)
MCV: 88 fL (ref 79–97)
Monocytes Absolute: 0.6 10*3/uL (ref 0.1–0.9)
Monocytes: 7 %
Neutrophils Absolute: 3.9 10*3/uL (ref 1.4–7.0)
Neutrophils: 50 %
Platelets: 240 10*3/uL (ref 150–450)
RBC: 5.03 x10E6/uL (ref 4.14–5.80)
RDW: 15.4 % (ref 11.6–15.4)
WBC: 7.9 10*3/uL (ref 3.4–10.8)

## 2021-04-16 LAB — BASIC METABOLIC PANEL
BUN/Creatinine Ratio: 14 (ref 10–24)
BUN: 20 mg/dL (ref 8–27)
CO2: 20 mmol/L (ref 20–29)
Calcium: 9.2 mg/dL (ref 8.6–10.2)
Chloride: 104 mmol/L (ref 96–106)
Creatinine, Ser: 1.42 mg/dL — ABNORMAL HIGH (ref 0.76–1.27)
Glucose: 121 mg/dL — ABNORMAL HIGH (ref 70–99)
Potassium: 5 mmol/L (ref 3.5–5.2)
Sodium: 138 mmol/L (ref 134–144)
eGFR: 53 mL/min/{1.73_m2} — ABNORMAL LOW (ref >59)

## 2021-04-17 ENCOUNTER — Other Ambulatory Visit: Payer: Self-pay

## 2021-04-17 ENCOUNTER — Ambulatory Visit (INDEPENDENT_AMBULATORY_CARE_PROVIDER_SITE_OTHER): Payer: Managed Care, Other (non HMO)

## 2021-04-17 DIAGNOSIS — I255 Ischemic cardiomyopathy: Secondary | ICD-10-CM | POA: Diagnosis not present

## 2021-04-17 LAB — ECHOCARDIOGRAM COMPLETE
AR max vel: 3.26 cm2
AV Area VTI: 2.79 cm2
AV Area mean vel: 2.72 cm2
AV Mean grad: 3 mmHg
AV Peak grad: 6.5 mmHg
Ao pk vel: 1.27 m/s
S' Lateral: 4.4 cm

## 2021-04-17 MED ORDER — PERFLUTREN LIPID MICROSPHERE
1.0000 mL | INTRAVENOUS | Status: AC | PRN
Start: 2021-04-17 — End: 2021-04-17
  Administered 2021-04-17: 2 mL via INTRAVENOUS

## 2021-04-23 NOTE — Pre-Procedure Instructions (Signed)
Instructed patient on the following items: Arrival time 1230 Nothing to eat or drink after 7am No meds AM of procedure Responsible person to drive you home and stay with you for 24 hrs Wash with special soap night before and morning of procedure

## 2021-04-24 ENCOUNTER — Other Ambulatory Visit: Payer: Self-pay

## 2021-04-24 ENCOUNTER — Ambulatory Visit (HOSPITAL_COMMUNITY)
Admission: RE | Admit: 2021-04-24 | Discharge: 2021-04-25 | Disposition: A | Discharge status: Home / Self Care | Payer: Managed Care, Other (non HMO) | Attending: Cardiology | Admitting: Cardiology

## 2021-04-24 ENCOUNTER — Encounter (HOSPITAL_COMMUNITY)
Admission: RE | Disposition: A | Discharge status: Home / Self Care | Payer: Managed Care, Other (non HMO) | Source: Home / Self Care | Attending: Cardiology

## 2021-04-24 DIAGNOSIS — I441 Atrioventricular block, second degree: Secondary | ICD-10-CM | POA: Diagnosis present

## 2021-04-24 DIAGNOSIS — I255 Ischemic cardiomyopathy: Secondary | ICD-10-CM | POA: Insufficient documentation

## 2021-04-24 DIAGNOSIS — I110 Hypertensive heart disease with heart failure: Secondary | ICD-10-CM | POA: Insufficient documentation

## 2021-04-24 DIAGNOSIS — I10 Essential (primary) hypertension: Secondary | ICD-10-CM | POA: Diagnosis present

## 2021-04-24 DIAGNOSIS — I442 Atrioventricular block, complete: Secondary | ICD-10-CM

## 2021-04-24 DIAGNOSIS — I2510 Atherosclerotic heart disease of native coronary artery without angina pectoris: Secondary | ICD-10-CM | POA: Diagnosis not present

## 2021-04-24 DIAGNOSIS — I5022 Chronic systolic (congestive) heart failure: Secondary | ICD-10-CM | POA: Insufficient documentation

## 2021-04-24 DIAGNOSIS — Z950 Presence of cardiac pacemaker: Secondary | ICD-10-CM

## 2021-04-24 DIAGNOSIS — Z951 Presence of aortocoronary bypass graft: Secondary | ICD-10-CM | POA: Insufficient documentation

## 2021-04-24 DIAGNOSIS — I429 Cardiomyopathy, unspecified: Secondary | ICD-10-CM

## 2021-04-24 DIAGNOSIS — E785 Hyperlipidemia, unspecified: Secondary | ICD-10-CM | POA: Diagnosis present

## 2021-04-24 HISTORY — PX: PACEMAKER IMPLANT: EP1218

## 2021-04-24 LAB — GLUCOSE, CAPILLARY: Glucose-Capillary: 121 mg/dL — ABNORMAL HIGH (ref 70–99)

## 2021-04-24 SURGERY — PACEMAKER IMPLANT

## 2021-04-24 MED ORDER — SACUBITRIL-VALSARTAN 49-51 MG PO TABS
1.0000 | ORAL_TABLET | Freq: Two times a day (BID) | ORAL | Status: DC
  Administered 2021-04-24 – 2021-04-25 (×2): 1 via ORAL
  Filled 2021-04-24 (×2): qty 1

## 2021-04-24 MED ORDER — LIDOCAINE HCL (PF) 1 % IJ SOLN
INTRAMUSCULAR | Status: DC | PRN
  Administered 2021-04-24: 40 mL

## 2021-04-24 MED ORDER — HEPARIN (PORCINE) IN NACL 1000-0.9 UT/500ML-% IV SOLN
INTRAVENOUS | Status: DC | PRN
  Administered 2021-04-24: 500 mL

## 2021-04-24 MED ORDER — ACETAMINOPHEN 325 MG PO TABS
325.0000 mg | ORAL_TABLET | ORAL | Status: DC | PRN
  Administered 2021-04-24: 650 mg via ORAL
  Administered 2021-04-25: 325 mg via ORAL
  Administered 2021-04-25 (×2): 650 mg via ORAL
  Filled 2021-04-24 (×3): qty 2

## 2021-04-24 MED ORDER — CEFAZOLIN SODIUM-DEXTROSE 2-4 GM/100ML-% IV SOLN
2.0000 g | INTRAVENOUS | Status: AC
  Administered 2021-04-24: 2 g via INTRAVENOUS

## 2021-04-24 MED ORDER — HEPARIN (PORCINE) IN NACL 2000-0.9 UNIT/L-% IV SOLN
INTRAVENOUS | Status: AC
  Filled 2021-04-24: qty 1000

## 2021-04-24 MED ORDER — FENTANYL CITRATE (PF) 100 MCG/2ML IJ SOLN
INTRAMUSCULAR | Status: DC | PRN
  Administered 2021-04-24: 25 ug via INTRAVENOUS

## 2021-04-24 MED ORDER — MIDAZOLAM HCL 5 MG/5ML IJ SOLN
INTRAMUSCULAR | Status: AC
  Filled 2021-04-24: qty 5

## 2021-04-24 MED ORDER — MIDAZOLAM HCL 5 MG/5ML IJ SOLN
INTRAMUSCULAR | Status: DC | PRN
  Administered 2021-04-24: 1 mg via INTRAVENOUS

## 2021-04-24 MED ORDER — LIDOCAINE HCL (PF) 1 % IJ SOLN
INTRAMUSCULAR | Status: AC
  Filled 2021-04-24: qty 60

## 2021-04-24 MED ORDER — FENTANYL CITRATE (PF) 100 MCG/2ML IJ SOLN
INTRAMUSCULAR | Status: AC
  Filled 2021-04-24: qty 2

## 2021-04-24 MED ORDER — SODIUM CHLORIDE 0.9 % IV SOLN
INTRAVENOUS | Status: AC
  Filled 2021-04-24: qty 2

## 2021-04-24 MED ORDER — POVIDONE-IODINE 10 % EX SWAB
2.0000 "application " | Freq: Once | CUTANEOUS | Status: AC
  Administered 2021-04-24: 2 via TOPICAL

## 2021-04-24 MED ORDER — CEFAZOLIN SODIUM-DEXTROSE 2-4 GM/100ML-% IV SOLN
INTRAVENOUS | Status: AC
  Filled 2021-04-24: qty 100

## 2021-04-24 MED ORDER — ONDANSETRON HCL 4 MG/2ML IJ SOLN: 4.0000 mg | Freq: Four times a day (QID) | INTRAMUSCULAR | Status: DC | PRN

## 2021-04-24 MED ORDER — CHLORHEXIDINE GLUCONATE 4 % EX LIQD
4.0000 "application " | Freq: Once | CUTANEOUS | Status: AC
  Administered 2021-04-24: 4 via TOPICAL
  Filled 2021-04-24: qty 60

## 2021-04-24 MED ORDER — SODIUM CHLORIDE 0.9 % IV SOLN: INTRAVENOUS | Status: DC

## 2021-04-24 MED ORDER — SODIUM CHLORIDE 0.9 % IV SOLN
80.0000 mg | INTRAVENOUS | Status: AC
  Administered 2021-04-24: 80 mg

## 2021-04-24 SURGICAL SUPPLY — 12 items
CABLE SURGICAL S-101-97-12 (CABLE) ×4 IMPLANT
CATH SELECT PACE 669183 (CATHETERS) ×2 IMPLANT
CUTTER LV DELIVERY CATHETER 7 (MISCELLANEOUS) ×2 IMPLANT
LEAD INGEVITY 7841 52 (Lead) ×2 IMPLANT
LEAD INGEVITY 7842 59 (Lead) ×2 IMPLANT
PACEMAKER ACCOLADE DR-EL (Pacemaker) ×2 IMPLANT
PAD DEFIB RADIO PHYSIO CONN (PAD) ×2 IMPLANT
SHEATH 7FR PRELUDE SNAP 13 (SHEATH) ×2 IMPLANT
SHEATH 8FR PRELUDE SNAP 13 (SHEATH) ×2 IMPLANT
SHEATH PROBE COVER 6X72 (BAG) ×2 IMPLANT
TRAY PACEMAKER INSERTION (PACKS) ×2 IMPLANT
WIRE HI TORQ VERSACORE-J 145CM (WIRE) ×2 IMPLANT

## 2021-04-24 NOTE — Discharge Instructions (Addendum)
Resume Aspirin on Tuesday December 13, 20222      Supplemental Discharge Instructions for  Pacemaker Patients    Activity No heavy lifting or vigorous activity with your left arm for 6 to 8 weeks.  Do not raise your left arm above your head for one week.  Gradually raise your affected arm as drawn below.             04/29/21                   04/30/21                   05/01/21                05/02/21 __  NO DRIVING for   1 week  ; you may begin driving on   29/51/88  .  WOUND CARE Keep the wound area clean and dry.  Do not get this area wet , no showers for one week; you may shower on   05/02/21  . The tape/steri-strips on your wound will fall off; do not pull them off.  No bandage is needed on the site.  DO  NOT apply any creams, oils, or ointments to the wound area. If you notice any drainage or discharge from the wound, any swelling or bruising at the site, or you develop a fever > 101? F after you are discharged home, call the office at once.  Special Instructions You are still able to use cellular telephones; use the ear opposite the side where you have your pacemaker/defibrillator.  Avoid carrying your cellular phone near your device. When traveling through airports, show security personnel your identification card to avoid being screened in the metal detectors.  Ask the security personnel to use the hand wand. Avoid arc welding equipment, MRI testing (magnetic resonance imaging), TENS units (transcutaneous nerve stimulators).  Call the office for questions about other devices. Avoid electrical appliances that are in poor condition or are not properly grounded. Microwave ovens are safe to be near or to operate.

## 2021-04-24 NOTE — Interval H&P Note (Signed)
History and Physical Interval Note:  04/24/2021 2:28 PM  Ronald Roth  has presented today for surgery, with the diagnosis of bradycardia.  The various methods of treatment have been discussed with the patient and family. After consideration of risks, benefits and other options for treatment, the patient has consented to  Procedure(s): PACEMAKER IMPLANT (N/A) as a surgical intervention.  The patient's history has been reviewed, patient examined, no change in status, stable for surgery.  I have reviewed the patient's chart and labs.  Questions were answered to the patient's satisfaction.     Shenetta Schnackenberg T Alon Mazor

## 2021-04-25 ENCOUNTER — Encounter (HOSPITAL_COMMUNITY): Payer: Self-pay | Admitting: Cardiology

## 2021-04-25 ENCOUNTER — Ambulatory Visit (HOSPITAL_COMMUNITY): Payer: Managed Care, Other (non HMO)

## 2021-04-25 DIAGNOSIS — I441 Atrioventricular block, second degree: Secondary | ICD-10-CM | POA: Diagnosis not present

## 2021-04-25 DIAGNOSIS — Z950 Presence of cardiac pacemaker: Secondary | ICD-10-CM

## 2021-04-25 DIAGNOSIS — I442 Atrioventricular block, complete: Secondary | ICD-10-CM | POA: Diagnosis not present

## 2021-04-25 HISTORY — DX: Presence of cardiac pacemaker: Z95.0

## 2021-04-25 MED ORDER — ASPIRIN EC 81 MG PO TBEC: 81.0000 mg | DELAYED_RELEASE_TABLET | Freq: Every morning | ORAL | 11 refills | Status: AC

## 2021-04-25 NOTE — Discharge Summary (Signed)
Discharge Summary    Patient ID: Ronald Roth MRN: [id number]; DOB: [birth year]  Admit date: 04/24/2021 Discharge date: 04/25/2021  PCP:  Smitty Cords, DO   [facility] Providers Cardiologist:  Julien Nordmann, MD  Electrophysiologist:  Lanier Prude, MD       Discharge Diagnoses    Principal Problem:   Heart block AV second degree Active Problems:   S/P dual chamber pacemaker   Cardiomyopathy [facility])   Coronary artery disease   Hyperlipidemia   Hypertension    Diagnostic Studies/Procedures    PACEMAKER IMPLANT 04/24/2021 Narrative CONCLUSIONS: 1. Symptomatic complete heart block 2. Dual chamber permanent pacemaker with left bundle area pacemaker lead 3. No early apparent complications.   _____________   History of Present Illness     Ronald Roth is a 70 y.o. 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  He had recently had his beta-blocker stopped due to 2:1 AV block on EKG. A f/u cardiac monitor demonstrated an 11.4 sec pause with complete heart block.  He was seen by Dr. Lalla Brothers 04/15/21 for EP consultation and dual chamber pacemaker was recommended.  The patient's EF was 25-30 in May 2022 prior to his CABG.  A f/u echocardiogram done prior to his pacemaker on 04/17/21 demonstrated improved LVF with EF 45-50, RVSP 36.8, mild MR.    Hospital Course     Consultants: none    The patient was brought in electively to [facility] on 04/24/21 for a planned dual chamber pacemaker implantation.  His procedure was performed by Dr. Lalla Brothers and there were no immediate complications.    Pacemaker Data: RA lead - Ingevity+ 52cm, serial [id number] RV lead - Ingevity+ 7842 59cm, serial [id number] Pulse generator - Accolade MRI EL DR I712, serial [id number]   The patient was evaluated  by Dr. Lalla Brothers his morning.  The patient is doing well.  His device was interrogated and is functioning normally.  His post op CXR was unremarkable.  He is felt to be ready for DC to home.  He may resume his ASA in 3 days (Tuesday).    Did the patient have an acute coronary syndrome (MI, NSTEMI, STEMI, etc) this admission?:  No                               Did the patient have a percutaneous coronary intervention (stent / angioplasty)?:  No.      _____________  Discharge Vitals Blood pressure (!) 159/74, pulse 83, temperature 98 F (36.7 C), temperature source Oral, resp. rate 18, height 5' 10.5" (1.791 m), weight 91.2 kg, SpO2 97 %.  Filed Weights   04/24/21 1247  Weight: 91.2 kg    Labs & Radiologic Studies    _____________  DG Chest 2 View  Result Date: 04/25/2021   CLINICAL DATA:  Status post pacemaker placement.  EXAM: CHEST - 2 VIEW COMPARISON:  November 25, 2020.  FINDINGS: Stable cardiomediastinal silhouette. Interval placement of left-sided pacemaker with leads in grossly good position. Sternotomy wires are noted. No pneumothorax is noted. Minimal pleural effusions are noted. Lungs are clear. Bony thorax is unremarkable.  IMPRESSION:  Interval placement of left-sided pacemaker with leads in grossly good position. No pneumothorax is noted.  Electronically Signed   By: Lupita Raider M.D.   On: 04/25/2021 09:42  Disposition   Pt is being discharged home today in good condition.  Follow-up Plans & Appointments    Follow-up Information     [facility] [address] [facility] Follow up on 04/30/2021.   Specialty: Cardiology Why: 9:20 am (for your wound check) Contact information: [address], [address] [place] [address] [phone]        Lanier Prude, MD Follow up on 07/15/2021.   Specialties: Cardiology, Radiology Why: 8:20 am (for pacemaker follow up) Contact information: [address] [place]  [address] [phone]                Discharge Instructions     (HEART FAILURE PATIENTS) Call MD:  Anytime you have any of the following symptoms: 1) 3 pound weight gain in 24 hours or 5 pounds in 1 week 2) shortness of breath, with or without a dry hacking cough 3) swelling in the hands, feet or stomach 4) if you have to sleep on extra pillows at night in order to breathe.   Complete by: As directed    Diet - low sodium heart healthy   Complete by: As directed    Discharge wound care:   Complete by: As directed    See instruction sheet   Driving Restrictions   Complete by: As directed    See instruction sheet   Increase activity slowly   Complete by: As directed    See instruction sheet   Lifting restrictions   Complete by: As directed    See instruction sheet       Discharge Medications   Allergies as of 04/25/2021       Reactions   Beta Adrenergic Blockers Other (See Comments)   AV node dysfunction and pauses. Enter per MD request (Dr. [name]) Pacemaker placed 04/24/2021        Medication List     TAKE these medications    acetaminophen 500 MG tablet Commonly known as: TYLENOL Take 2 tablets (1,000 mg total) by mouth every 6 (six) hours as needed.   aspirin EC 81 MG tablet Take 1 tablet (81 mg total) by mouth in the morning. Swallow whole. Start taking on: April 28, 2021 What changed: These instructions start on April 28, 2021. If you are unsure what to do until then, ask your doctor or other care provider.   atorvastatin 80 MG tablet Commonly known as: LIPITOR Take 1 tablet (80 mg total) by mouth in the morning. Take 80 mg by mouth in the morning.   diphenhydrAMINE 25 MG tablet Commonly known as: BENADRYL Take 75 mg by mouth at bedtime.   Entresto 49-51 MG Generic drug: sacubitril-valsartan Take 1 tablet by mouth 2 (two) times daily.   ezetimibe 10 MG tablet Commonly known as: ZETIA Take 1 tablet (10 mg total) by mouth in the morning.    fenofibrate 145 MG tablet Commonly known as: TRICOR Take 145 mg by mouth in the morning.   furosemide 20 MG tablet Commonly known as: LASIX Take 1 tablet (20 mg total) by mouth daily.   Jardiance 25 MG Tabs tablet Generic drug: empagliflozin Take 1 tablet (25 mg total) by mouth daily before breakfast.   nitroGLYCERIN 0.4 MG SL tablet Commonly known as: NITROSTAT Place 1 tablet (0.4 mg total) under the tongue every 5 (five) minutes x 3 doses as needed for chest pain.   omeprazole 20 MG capsule Commonly known as: PRILOSEC Take 20 mg by mouth in the morning.  Ozempic (0.25 or 0.5 MG/DOSE) 2 MG/1.5ML Sopn Generic drug: Semaglutide(0.25 or 0.5MG /DOS) INJECT SUBCUTANEOUSLY 0.5  MG EVERY WEEK What changed: See the new instructions.   potassium chloride 10 MEQ tablet Commonly known as: KLOR-CON Take 1 tablet (10 mEq total) by mouth daily.   Vitamin D3 125 MCG (5000 UT) Caps Take 1 capsule (5,000 Units total) by mouth daily.               Discharge Care Instructions  (From admission, onward)           Start     Ordered   04/25/21 0000  Discharge wound care:       Comments: See instruction sheet   04/25/21 0952              Outstanding Labs/Studies    Duration of Discharge Encounter  Greater than 30 minutes including physician time.  Signed, Tereso Newcomer, PA-C 04/25/2021, 9:58 AM

## 2021-04-25 NOTE — Progress Notes (Signed)
   Progress Note  Patient Name: Ronald Roth Date of Encounter: 04/25/2021  [facility] Cardiologist: None   Subjective   NAEO. Doing well post PPM implant.  Inpatient Medications    Scheduled Meds:  sacubitril-valsartan  1 tablet Oral BID   Continuous Infusions:  PRN Meds: acetaminophen, ondansetron (ZOFRAN) IV   Vital Signs    Vitals:   04/24/21 1728 04/24/21 1815 04/24/21 1947 04/25/21 0547  BP:   (!) 168/66 (!) 159/74  Pulse:    83  Resp:      Temp:   99 F (37.2 C) 98 F (36.7 C)  TempSrc:   Oral Oral  SpO2: 97% 97% 93% 97%  Weight:      Height:        Intake/Output Summary (Last 24 hours) at 04/25/2021 0851 Last data filed at 04/24/2021 1800 Gross per 24 hour  Intake 305 ml  Output --  Net 305 ml   Last 3 Weights 04/24/2021 04/15/2021 03/30/2021  Weight (lbs) 201 lb 202 lb 201 lb 6.4 oz  Weight (kg) 91.173 kg 91.627 kg 91.354 kg      Telemetry    pacing - Personally Reviewed  ECG    Left bundle area pacing. QRSd ~1109ms.  Personally Reviewed  Physical Exam   GEN: No acute distress.   Neck: No JVD Cardiac: RRR, no murmurs, rubs, or gallops. Ppm pocket without hematoma or bleeding or significant pain. Respiratory: Clear to auscultation bilaterally. GI: Soft, nontender, non-distended  MS: No edema; No deformity. Neuro:  Nonfocal  Psych: Normal affect   Labs    High Sensitivity Troponin:  No results for input(s): TROPONINIHS in the last 720 hours.   ChemistryNo results for input(s): NA, K, CL, CO2, GLUCOSE, BUN, CREATININE, CALCIUM, MG, PROT, ALBUMIN, AST, ALT, ALKPHOS, BILITOT, GFRNONAA, GFRAA, ANIONGAP in the last 168 hours.  Lipids No results for input(s): CHOL, TRIG, HDL, LABVLDL, LDLCALC, CHOLHDL in the last 168 hours.  HematologyNo results for input(s): WBC, RBC, HGB, HCT, MCV, MCH, MCHC, RDW, PLT in the last 168 hours. Thyroid No results for input(s): TSH, FREET4 in the last 168 hours.  BNPNo results for input(s): BNP, PROBNP in  the last 168 hours.  DDimer No results for input(s): DDIMER in the last 168 hours.   Radiology    EP PPM/ICD IMPLANT  Result Date: 04/24/2021  CONCLUSIONS:  1. Symptomatic complete heart block  2. Dual chamber permanent pacemaker with left bundle area pacemaker lead  3.  No early apparent complications.    Cardiac Studies   Device interrogation shows stable lead parameters.  Patient Profile     70 y.o. male with CHB who is no w s/p PPM implant from 04/24/2021.  Assessment & Plan    #CHB Doing well after PPM implant. CXR shows stable lead position. Device interrogation shows stable lead parameters. OK to discharge today.  For questions or updates, please contact [facility] Please consult [email or web address] for contact info under        Signed, Lanier Prude, MD  04/25/2021, 8:51 AM

## 2021-04-27 ENCOUNTER — Encounter: Payer: Self-pay | Admitting: Family Medicine

## 2021-04-27 ENCOUNTER — Encounter (HOSPITAL_COMMUNITY): Payer: Self-pay | Admitting: Cardiology

## 2021-04-28 ENCOUNTER — Other Ambulatory Visit: Payer: Self-pay

## 2021-04-28 ENCOUNTER — Ambulatory Visit (INDEPENDENT_AMBULATORY_CARE_PROVIDER_SITE_OTHER): Payer: Medicare Other | Admitting: Family Medicine

## 2021-04-28 ENCOUNTER — Encounter: Payer: Self-pay | Admitting: Family Medicine

## 2021-04-28 VITALS — BP 122/70 | HR 111 | Ht 70.5 in | Wt 201.0 lb

## 2021-04-28 DIAGNOSIS — B379 Candidiasis, unspecified: Secondary | ICD-10-CM

## 2021-04-28 DIAGNOSIS — R350 Frequency of micturition: Secondary | ICD-10-CM

## 2021-04-28 DIAGNOSIS — N3001 Acute cystitis with hematuria: Secondary | ICD-10-CM | POA: Diagnosis not present

## 2021-04-28 LAB — POCT URINALYSIS DIPSTICK
Bilirubin, UA: NEGATIVE
Glucose, UA: POSITIVE — AB
Ketones, UA: NEGATIVE
Nitrite, UA: NEGATIVE
Protein, UA: POSITIVE — AB
Spec Grav, UA: 1.015 (ref 1.010–1.025)
Urobilinogen, UA: 0.2 E.U./dL
pH, UA: 5 (ref 5.0–8.0)

## 2021-04-28 MED ORDER — CEPHALEXIN 500 MG PO CAPS: 500.0000 mg | ORAL_CAPSULE | Freq: Three times a day (TID) | ORAL | 0 refills | Status: DC

## 2021-04-28 MED ORDER — FLUCONAZOLE 150 MG PO TABS: ORAL_TABLET | ORAL | 0 refills | Status: DC

## 2021-04-28 NOTE — Patient Instructions (Addendum)
Thank you for coming to the office today.  1. You have a Urinary Tract Infection - this is very common, your symptoms are reassuring and you should get better within 1 week on the antibiotics - Start Keflex 500mg 3 times daily for next 7 days, complete entire course, even if feeling better - We sent urine for a culture, we will call you within next few days if we need to change antibiotics - Please drink plenty of fluids, improve hydration over next 1 week  If symptoms worsening, developing nausea / vomiting, worsening back pain, fevers / chills / sweats, then please return for re-evaluation sooner.  If you take AZO OTC - limit this to 2-3 days MAX to avoid affecting kidneys  D-Mannose is a natural supplement that can actually help bind to urinary bacteria and reduce their effectiveness it can help prevent UTI from forming, and may reduce some symptoms. It likely cannot cure an active UTI but it is worth a try and good to prevent them with. Try 500mg twice a day at a full dose if you want, or check package instructions for more info   Please schedule a Follow-up Appointment to: Return if symptoms worsen or fail to improve.  If you have any other questions or concerns, please feel free to call the office or send a message through MyChart. You may also schedule an earlier appointment if necessary.  Additionally, you may be receiving a survey about your experience at our office within a few days to 1 week by e-mail or mail. We value your feedback.  Nikoleta Dady, DO [facility], [facility] 

## 2021-04-28 NOTE — Progress Notes (Signed)
Subjective:    Patient ID: Ronald Roth, male    DOB: 1950-06-05, 70 y.o.   MRN: [id number]  Ronald Roth is a 70 y.o. male presenting on 04/28/2021 for Urinary Frequency  Patient presents for a same day appointment.   HPI  UTI Urinary Urgency Reports symptoms onset about 1 week ago. Admits dysuria and some discomfort, and associated itching Admits some color change and odor He had low grade fever 1 night few days before operation for pacemaker Note he has DM2, takes Jardiance Denies hematuria  S/p Pacemaker recently on 12/9 by Dr Lalla Brothers Has had one episode of low grade temp since that time Otherwise doing well, he has upcoming post-op follow-up    Depression screen [facility] 2/9 03/16/2021 12/16/2020 11/21/2020  Decreased Interest 0 0 0  Down, Depressed, Hopeless 1 0 0  PHQ - 2 Score 1 0 0  Altered sleeping 0 0 0  Tired, decreased energy 1 0 1  Change in appetite 0 0 0  Feeling bad or failure about yourself  1 0 0  Trouble concentrating 0 0 0  Moving slowly or fidgety/restless 0 0 0  Suicidal thoughts 0 0 0  PHQ-9 Score 3 0 1  Difficult doing work/chores Not difficult at all Not difficult at all Not difficult at all    Social History   Tobacco Use   Smoking status: Former    Packs/day: 1.00    Years: 30.00    Pack years: 30.00    Types: Cigarettes    Quit date: 04/18/1991    Years since quitting: 30.0   Smokeless tobacco: Former    Types: Chew    Quit date: 04/16/1993  Vaping Use   Vaping Use: Never used  Substance Use Topics   Alcohol use: Yes    Comment: Occasionally    Drug use: Never    Review of Systems Per HPI unless specifically indicated above     Objective:    BP 122/70    Pulse (!) 111    Ht 5' 10.5" (1.791 m)    Wt 201 lb (91.2 kg)    SpO2 100%    BMI 28.43 kg/m   Wt Readings from Last 3 Encounters:  04/28/21 201 lb (91.2 kg)  04/24/21 201 lb (91.2 kg)  04/15/21 202 lb (91.6 kg)    Physical Exam Vitals and nursing note reviewed.   Constitutional:      General: He is not in acute distress.    Appearance: Normal appearance. He is well-developed. He is not diaphoretic.     Comments: Well-appearing, comfortable, cooperative  HENT:     Head: Normocephalic and atraumatic.  Eyes:     General:        Right eye: No discharge.        Left eye: No discharge.     Conjunctiva/sclera: Conjunctivae normal.  Cardiovascular:     Rate and Rhythm: Normal rate.  Pulmonary:     Effort: Pulmonary effort is normal.  Skin:    General: Skin is warm and dry.     Findings: No erythema or rash.  Neurological:     Mental Status: He is alert and oriented to person, place, and time.  Psychiatric:        Mood and Affect: Mood normal.        Behavior: Behavior normal.        Thought Content: Thought content normal.     Comments: Well groomed, good eye contact, normal speech and thoughts  Results for orders placed or performed in visit on 04/28/21  POCT Urinalysis Dipstick  Result Value Ref Range   Color, UA Orange    Clarity, UA Cloudy    Glucose, UA Positive (A) Negative   Bilirubin, UA Negative    Ketones, UA Negative    Spec Grav, UA 1.015 1.010 - 1.025   Blood, UA Moderate ++    pH, UA 5.0 5.0 - 8.0   Protein, UA Positive (A) Negative   Urobilinogen, UA 0.2 0.2 or 1.0 E.U./dL   Nitrite, UA Negative    Leukocytes, UA Trace (A) Negative   Appearance     Odor        Assessment & Plan:   Problem List Items Addressed This Visit   None Visit Diagnoses     Acute cystitis with hematuria    -  Primary   Relevant Medications   cephALEXin (KEFLEX) 500 MG capsule   Urinary frequency       Relevant Orders   POCT Urinalysis Dipstick (Completed)   Urine Culture   Antibiotic-induced yeast infection       Relevant Medications   cephALEXin (KEFLEX) 500 MG capsule   fluconazole (DIFLUCAN) 150 MG tablet       Clinically consistent with UTI and confirmed on UA. No recent UTIs or abx courses. Known DM2 (controlled but on  jardiance). No concern for pyelo today (no systemic symptoms, neg fever, back pain, n/v).  Plan: 1. UA positive 2. Ordered Urine culture 3. Keflex 500mg  TID x 7 days 4. Improve PO hydration 5. RTC if no improvement 1-2 weeks, red flags given to return sooner  Add Diflucan given SGLT2 medication as precaution.  Note with recent post-op from pacemaker, he should f/u with Cardiology as scheduled review with them as well.  Meds ordered this encounter  Medications   cephALEXin (KEFLEX) 500 MG capsule    Sig: Take 1 capsule (500 mg total) by mouth 3 (three) times daily. For 7 days    Dispense:  21 capsule    Refill:  0   fluconazole (DIFLUCAN) 150 MG tablet    Sig: Take one tablet by mouth on Day 1. Repeat dose 2nd tablet on Day 3.    Dispense:  2 tablet    Refill:  0      Follow up plan: Return if symptoms worsen or fail to improve.    Nobie Putnam, [facility] 04/28/2021, 11:49 AM

## 2021-04-30 ENCOUNTER — Ambulatory Visit (INDEPENDENT_AMBULATORY_CARE_PROVIDER_SITE_OTHER): Payer: Managed Care, Other (non HMO)

## 2021-04-30 ENCOUNTER — Other Ambulatory Visit: Payer: Self-pay

## 2021-04-30 DIAGNOSIS — I441 Atrioventricular block, second degree: Secondary | ICD-10-CM | POA: Diagnosis not present

## 2021-04-30 LAB — CUP PACEART INCLINIC DEVICE CHECK
Brady Statistic RA Percent Paced: 1 % — CL
Brady Statistic RV Percent Paced: 99 %
Date Time Interrogation Session: 20221215120435
Implantable Lead Implant Date: 20221209
Implantable Lead Implant Date: 20221209
Implantable Lead Location: 753859
Implantable Lead Location: 753860
Implantable Lead Model: 7841
Implantable Lead Model: 7842
Implantable Lead Serial Number: 1100565
Implantable Lead Serial Number: 1189145
Implantable Pulse Generator Implant Date: 20221209
Lead Channel Impedance Value: 565 Ohm
Lead Channel Impedance Value: 719 Ohm
Lead Channel Pacing Threshold Amplitude: 0.5 V
Lead Channel Pacing Threshold Amplitude: 0.7 V
Lead Channel Pacing Threshold Pulse Width: 0.4 ms
Lead Channel Pacing Threshold Pulse Width: 0.4 ms
Lead Channel Sensing Intrinsic Amplitude: 5.5 mV
Lead Channel Setting Pacing Amplitude: 3.5 V
Lead Channel Setting Pacing Amplitude: 3.5 V
Lead Channel Setting Pacing Pulse Width: 0.4 ms
Lead Channel Setting Sensing Sensitivity: 3 mV
Pulse Gen Serial Number: 549513

## 2021-04-30 NOTE — Progress Notes (Signed)
Wound check appointment. Due to timing of appt/ patient vacation, Steri-strips were NOT removed. Wound surrounding skin without redness or edema. Patient educted to keep steri strips intact for at least 14 days.  Thresholds, P wave sensing, and impedances consistent with implant measurements. Device programmed at 3.5V/auto capture programmed on for extra safety margin until 3 month visit. Histogram distribution appropriate for patient and level of activity. 2 AT/AF episodes- Both occurring 04/25/21 lasting 1 second and appear to be AT with peak A rate of 113 and 1:1 conduction.  No high ventricular rates noted. Patient educated about wound care, arm mobility, lifting restrictions. Patient is enrolled in remote monitoring, next scheduled check 07/27/21.  ROV with Dr. Lalla Brothers on 07/15/21.

## 2021-04-30 NOTE — Patient Instructions (Signed)

## 2021-05-02 LAB — URINE CULTURE
MICRO NUMBER:: 12762011
SPECIMEN QUALITY:: ADEQUATE

## 2021-05-29 ENCOUNTER — Encounter: Payer: Self-pay | Admitting: Family Medicine

## 2021-05-29 DIAGNOSIS — N3001 Acute cystitis with hematuria: Secondary | ICD-10-CM

## 2021-05-29 DIAGNOSIS — T3695XA Adverse effect of unspecified systemic antibiotic, initial encounter: Secondary | ICD-10-CM

## 2021-05-29 DIAGNOSIS — B379 Candidiasis, unspecified: Secondary | ICD-10-CM

## 2021-05-29 MED ORDER — FLUCONAZOLE 150 MG PO TABS: ORAL_TABLET | ORAL | 0 refills | Status: DC

## 2021-05-29 MED ORDER — CEPHALEXIN 500 MG PO CAPS: 500.0000 mg | ORAL_CAPSULE | Freq: Three times a day (TID) | ORAL | 0 refills | Status: DC

## 2021-06-15 ENCOUNTER — Ambulatory Visit: Payer: Managed Care, Other (non HMO) | Admitting: Cardiovascular Disease

## 2021-06-21 NOTE — Progress Notes (Signed)
Cardiology Office Note  Date:  06/22/2021   ID:  Ronald Roth, DOB 1950-10-23, MRN [id number]  PCP:  Smitty Cords, DO   Chief Complaint  Patient presents with   6 month follow up     "Doing well." Medications reviewed by the patient verbally.      HPI:  [name] is a 71 year old gentleman with past medical history of Diabetes type 2 Coronary disease prior MI 2012 angioplasty, stent CABG 10/2020 Hyperlipidemia  former smoker Carotid Velocities in the right ICA are consistent with a 40-59%  stenosis.  Who presents for follow-up of his coronary artery disease coronary disease  LOV 11/22  heart monitor November 2022  showed an 11.4-second pause with complete heart block. Pacemaker placed 04/24/21  Echocardiogram December 2022  1. Left ventricular ejection fraction, by estimation, is 45 to 50%. The  left ventricle has mildly decreased function. The left ventricle  demonstrates regional wall motion abnormalities (septal wall hypokinesis  consistent with conduction abnormality/bundle branch block).   2. Right ventricular systolic function is normal. The right ventricular  size is normal. There is mildly elevated pulmonary artery systolic  pressure. The estimated right ventricular systolic pressure is 36.8 mmHg.   3. Left atrial size was mildly dilated.   Lab work reviewed A1c 7.1 Total cholesterol 135 LDL 79 Creatinine 1.42 BUN 20  Pressure 140-150 Pulse 70 to 80  Traveling, weigth up, eating more  Reports only taking Entresto once a day not twice a day as prescribed Denies leg swelling, no shortness of breath  EKG personally reviewed by myself on todays visit Atrial sensed V paced with rate 91 bpm  Other past cardiac history reviewed Previous symptoms of angina leading to stress test showing large region of ischemia, Followed by cardiac catheterization October 20, 2020 results as below Severe three-vessel coronary artery disease, including long segment  of diffuse mid LAD disease of 50-90% stenosis with moderate to severe associated calcification and occlusion of branch of D1, small LCx with chronic total occlusion of OM1, and large RCA with 70-90% tandem lesions involving rPDA and 60% rPL stenosis. Patient rPL stent with mild in-stent restenosis (~20%). Upper normal left ventricular filling pressure (LVEDP ~15 mmHg).   Referred for CABG Seen in [place], Underwent CABG October 31, 2020  (LIMA to LAD, SVG to DIAGONAL, SVG to PDA and PLB),  Zio monitor   avg HR of 96 bpm. Sinus Rhythm.   3 Supraventricular Tachycardia runs occurred, the run with the fastest interval lasting 13 beats with a max rate of 193 bpm, the longest lasting 19.3 secs with an avg rate of 117 bpm.   Isolated SVEs were rare (<1.0%), SVE Couplets were rare (<1.0%), and SVE Triplets were rare (<1.0%). Isolated VEs were occasional  (1.6%, 30479), VE Couplets were rare (<1.0%, 3115), and VE Triplets were rare (<1.0%, 1).   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  PSH:    Past Surgical History:  Procedure Laterality Date   CARDIAC CATHETERIZATION     CORONARY ANGIOPLASTY WITH STENT PLACEMENT  2012   x 1   CORONARY ARTERY BYPASS GRAFT N/A 10/31/2020   Procedure: CORONARY ARTERY BYPASS GRAFTING (CABG)x 4 ON CARDIOPULMONARY BYPASS. LEFT INTERNAL MAMMARY ARTERY AND RIGHT GREATER SAPHENOUS VEIN. LIMA TO LAD, SVG TO DIAG., SVG TO PDA  AND PLB;  Surgeon: Vickey Sages,  Merri Brunette, MD;  [facility]: [facility] OR;  Service: Open Heart Surgery;  Laterality: N/A;   ENDOVEIN HARVEST OF GREATER SAPHENOUS VEIN Right 10/31/2020   Procedure: ENDOVEIN HARVEST OF GREATER SAPHENOUS VEIN;  Surgeon: Linden Dolin, MD;  Location: [facility] OR;  Service: Open Heart Surgery;  Laterality: Right;   LEFT HEART  CATH AND CORONARY ANGIOGRAPHY N/A 10/20/2020   Procedure: LEFT HEART CATH AND CORONARY ANGIOGRAPHY;  Surgeon: Yvonne Kendall, MD;  [facility]: [facility] INVASIVE CV LAB;  Service: Cardiovascular;  Laterality: N/A;   PACEMAKER IMPLANT N/A 04/24/2021   Procedure: PACEMAKER IMPLANT;  Surgeon: Lanier Prude, MD;  [facility] INVAS[facility] CV LAB;  Service: Cardiovascular;  Laterality: N/A;   Ruptured disc     Sciatica    STENT PLACEMENT VASCULAR ([facility] HX)  2012   TEE WITHOUT CARDIOVERSION N/A 10/31/2020   Procedure: TRANSESOPHAGEAL ECHOCARDIOGRAM (TEE);  Surgeon: Linden Dolin, MD;  Location: [facility] OR;  Service: Open Heart Surgery;  Laterality: N/A;    Current Outpatient Medications on File Prior to Visit  Medication Sig Dispense Refill   acetaminophen (TYLENOL) 500 MG tablet Take 2 tablets (1,000 mg total) by mouth every 6 (six) hours as needed. 30 tablet 0   aspirin EC 81 MG tablet Take 1 tablet (81 mg total) by mouth in the morning. Swallow whole. 30 tablet 11   atorvastatin (LIPITOR) 80 MG tablet Take 1 tablet (80 mg total) by mouth in the morning. Take 80 mg by mouth in the morning. 90 tablet 3   Cholecalciferol (VITAMIN D3) 125 MCG (5000 UT) CAPS Take 1 capsule (5,000 Units total) by mouth daily. 90 capsule 3   diphenhydrAMINE (BENADRYL) 25 MG tablet Take 75 mg by mouth at bedtime.     ezetimibe (ZETIA) 10 MG tablet Take 1 tablet (10 mg total) by mouth in the morning. 90 tablet 3   fenofibrate (TRICOR) 145 MG tablet Take 145 mg by mouth in the morning.     furosemide (LASIX) 20 MG tablet Take 1 tablet (20 mg total) by mouth daily. 30 tablet 3   JARDIANCE 25 MG TABS tablet Take 1 tablet (25 mg total) by mouth daily before breakfast. 90 tablet 3   nitroGLYCERIN (NITROSTAT) 0.4 MG SL tablet Place 1 tablet (0.4 mg total) under the tongue every 5 (five) minutes x 3 doses as needed for chest pain. 25 tablet 3   omeprazole (PRILOSEC) 20 MG capsule Take 20 mg by mouth in the morning.     OZEMPIC, 0.25 OR  0.5 MG/DOSE, 2 MG/1.5ML SOPN INJECT SUBCUTANEOUSLY 0.5  MG EVERY WEEK (Patient taking differently: Inject 0.25 mg into the skin every Saturday.) 4.5 mL 1   potassium chloride (KLOR-CON) 10 MEQ tablet Take 1 tablet (10 mEq total) by mouth daily. 30 tablet 3   sacubitril-valsartan (ENTRESTO) 49-51 MG Take 1 tablet by mouth 2 (two) times daily. 180 tablet 3   cephALEXin (KEFLEX) 500 MG capsule Take 1 capsule (500 mg total) by mouth 3 (three) times daily. For 7 days 21 capsule 0   fluconazole (DIFLUCAN) 150 MG tablet Take one tablet by mouth on Day 1. Repeat dose 2nd tablet on Day 3. (Patient not taking: Reported on 06/22/2021) 2 tablet 0   No current facility-administered medications on file prior to visit.    Allergies:   Beta adrenergic blockers   Social History:  The patient  reports that he quit smoking about 30 years ago. His smoking use included cigarettes. He has a 30.00 pack-year smoking history.  He quit smokeless tobacco use about 28 years ago.  His smokeless tobacco use included chew. He reports current alcohol use. He reports that he does not use drugs.   Family History:   family history includes Aortic aneurysm in his father; Arrhythmia in his mother; Hyperlipidemia in his sister; Hypertension in his sister; Pneumonia in his mother.    Review of Systems: Review of Systems  Constitutional: Negative.   HENT: Negative.    Respiratory: Negative.    Cardiovascular: Negative.   Gastrointestinal: Negative.   Musculoskeletal: Negative.   Neurological: Negative.   Psychiatric/Behavioral: Negative.    All other systems reviewed and are negative.   PHYSICAL EXAM: VS:  BP (!) 160/80 (BP Location: Left Arm, Patient Position: Sitting, Cuff Size: Normal)    Pulse 91    Ht 5' 10.5" (1.791 m)    Wt 204 lb 6 oz (92.7 kg)    SpO2 98%    BMI 28.91 kg/m  , BMI Body mass index is 28.91 kg/m. Constitutional:  oriented to person, place, and time. No distress.  HENT:  Head: Grossly normal Eyes:  no  discharge. No scleral icterus.  Neck: No JVD, no carotid bruits  Cardiovascular: Regular rate and rhythm, no murmurs appreciated Pulmonary/Chest: Clear to auscultation bilaterally, no wheezes or rails Abdominal: Soft.  no distension.  no tenderness.  Musculoskeletal: Normal range of motion Neurological:  normal muscle tone. Coordination normal. No atrophy Skin: Skin warm and dry Psychiatric: normal affect, pleasant   Recent Labs: 11/01/2020: Magnesium 2.1 11/19/2020: ALT 9; TSH 1.110 03/06/2021: B Natriuretic Peptide 618.2 04/15/2021: BUN 20; Creatinine, Ser 1.42; Hemoglobin 15.0; Platelets 240; Potassium 5.0; Sodium 138    Lipid Panel Lab Results  Component Value Date   CHOL 135 11/19/2020   HDL 30 (L) 11/19/2020   LDLCALC 79 11/19/2020   TRIG 149 11/19/2020      Wt Readings from Last 3 Encounters:  06/22/21 204 lb 6 oz (92.7 kg)  04/28/21 201 lb (91.2 kg)  04/24/21 201 lb (91.2 kg)     ASSESSMENT AND PLAN:  Problem List Items Addressed This Visit       Cardiology Problems   Cardiomyopathy [facility])   Coronary artery disease involving native coronary artery of native heart without angina pectoris - Primary   Relevant Orders   EKG 12-Lead   Hyperlipidemia associated with type 2 diabetes mellitus (HCC)   Other Visit Diagnoses     S/P CABG x 4       Relevant Orders   EKG 12-Lead   Heart block AV complete (HCC)       Symptomatic bradycardia       Relevant Orders   EKG 12-Lead   AV heart block       Essential hypertension         Cad: Currently with no symptoms of angina. No further workup at this time. Continue current medication regimen.  Essential hypertension Increase Entresto to 49/51 mg twice daily Beta-blockers previously held in the setting of 2-1 AV block, pauses We will hold off on restart despite pacer.  Will defer to EP  Shortness of breath Finished cardiac rehab Continue Lasix 20 daily with potassium 10  Hyperlipidemia LDL slightly above goal,  continue Lipitor Zetia, discussed PCSK9 inhibitor  2:1 AV block/complete heart block Beta-blocker previously held, ZIO monitor showing heart block, Pacer placed by EP  Cardiomyopathy Continue Jardiance, increase Entresto dosing, continue Lasix  Consider dropping potassium, starting spironolactone  Essential hypertension Only taking Entresto once  a day not twice a day, encouraged him to take this twice a day monitor blood pressure at home   Total encounter time more than 25 minutes  Greater than 50% was spent in counseling and coordination of care with the patient    Signed, Dossie Arbourim Dejane Scheibe, M.D., Ph.D. [facility], [place] [phone]

## 2021-06-22 ENCOUNTER — Encounter: Payer: Self-pay | Admitting: Cardiovascular Disease

## 2021-06-22 ENCOUNTER — Other Ambulatory Visit: Payer: Self-pay

## 2021-06-22 ENCOUNTER — Ambulatory Visit (INDEPENDENT_AMBULATORY_CARE_PROVIDER_SITE_OTHER): Payer: Managed Care, Other (non HMO) | Admitting: Cardiovascular Disease

## 2021-06-22 VITALS — BP 160/80 | HR 91 | Ht 70.5 in | Wt 204.4 lb

## 2021-06-22 DIAGNOSIS — I4430 Unspecified atrioventricular block: Secondary | ICD-10-CM

## 2021-06-22 DIAGNOSIS — I255 Ischemic cardiomyopathy: Secondary | ICD-10-CM

## 2021-06-22 DIAGNOSIS — E785 Hyperlipidemia, unspecified: Secondary | ICD-10-CM

## 2021-06-22 DIAGNOSIS — R001 Bradycardia, unspecified: Secondary | ICD-10-CM | POA: Diagnosis not present

## 2021-06-22 DIAGNOSIS — Z951 Presence of aortocoronary bypass graft: Secondary | ICD-10-CM

## 2021-06-22 DIAGNOSIS — I2510 Atherosclerotic heart disease of native coronary artery without angina pectoris: Secondary | ICD-10-CM | POA: Diagnosis not present

## 2021-06-22 DIAGNOSIS — I442 Atrioventricular block, complete: Secondary | ICD-10-CM | POA: Diagnosis not present

## 2021-06-22 DIAGNOSIS — I10 Essential (primary) hypertension: Secondary | ICD-10-CM

## 2021-06-22 DIAGNOSIS — E1169 Type 2 diabetes mellitus with other specified complication: Secondary | ICD-10-CM

## 2021-06-22 MED ORDER — POTASSIUM CHLORIDE ER 10 MEQ PO TBCR: 10.0000 meq | EXTENDED_RELEASE_TABLET | ORAL | 3 refills | Status: AC | PRN

## 2021-06-22 MED ORDER — FUROSEMIDE 20 MG PO TABS
20.0000 mg | ORAL_TABLET | ORAL | 3 refills | Status: AC | PRN
Start: 2021-06-22 — End: ?

## 2021-06-22 NOTE — Patient Instructions (Addendum)
Medication Instructions:  Please continue  Lasix and potassium as needed  for ankle swelling, weight gain, shortness of breath  Continue your  Entresto twice a day  If you need a refill on your cardiac medications before your next appointment, please call your pharmacy.   Lab work: No new labs needed  Testing/Procedures: No new testing needed  Follow-Up: At [facility], you and your health needs are our priority.  As part of our continuing mission to provide you with exceptional heart care, we have created designated Provider Care Teams.  These Care Teams include your primary Cardiologist (physician) and Advanced Practice Providers (APPs -  Physician Assistants and Nurse Practitioners) who all work together to provide you with the care you need, when you need it.  You will need a follow up appointment in 6 months, APP ok  Providers on your designated Care Team:   Nicolasa Ducking, NP Eula Listen, PA-C Cadence Fransico Michael, [place]  COVID-19 Vaccine Information can be found at: [email or web address] For questions related to vaccine distribution or appointments, please email [email or web address] or call [phone].

## 2021-06-26 ENCOUNTER — Encounter: Payer: Self-pay | Admitting: Cardiovascular Disease

## 2021-06-26 ENCOUNTER — Encounter: Payer: Self-pay | Admitting: Family Medicine

## 2021-06-26 DIAGNOSIS — E1169 Type 2 diabetes mellitus with other specified complication: Secondary | ICD-10-CM

## 2021-06-29 ENCOUNTER — Other Ambulatory Visit: Payer: Self-pay

## 2021-06-29 MED ORDER — ATORVASTATIN CALCIUM 80 MG PO TABS: 80.0000 mg | ORAL_TABLET | Freq: Every morning | ORAL | 3 refills | Status: DC

## 2021-06-29 MED ORDER — FENOFIBRATE 145 MG PO TABS: 145.0000 mg | ORAL_TABLET | Freq: Every morning | ORAL | 3 refills | Status: DC

## 2021-06-29 MED ORDER — ENTRESTO 49-51 MG PO TABS: 1.0000 | ORAL_TABLET | Freq: Two times a day (BID) | ORAL | 3 refills | Status: DC

## 2021-07-15 ENCOUNTER — Encounter: Payer: Self-pay | Admitting: Cardiology

## 2021-07-15 ENCOUNTER — Ambulatory Visit (INDEPENDENT_AMBULATORY_CARE_PROVIDER_SITE_OTHER): Payer: Managed Care, Other (non HMO) | Admitting: Cardiology

## 2021-07-15 ENCOUNTER — Other Ambulatory Visit: Payer: Self-pay

## 2021-07-15 VITALS — BP 154/88 | HR 75 | Ht 70.5 in | Wt 205.0 lb

## 2021-07-15 DIAGNOSIS — I10 Essential (primary) hypertension: Secondary | ICD-10-CM | POA: Diagnosis not present

## 2021-07-15 DIAGNOSIS — I442 Atrioventricular block, complete: Secondary | ICD-10-CM | POA: Diagnosis not present

## 2021-07-15 DIAGNOSIS — Z950 Presence of cardiac pacemaker: Secondary | ICD-10-CM

## 2021-07-15 NOTE — Progress Notes (Signed)
?Electrophysiology Office Follow up Visit Note:   ? ?Date:  07/15/2021  ? ?ID:  Ronald Roth, DOB 21-Feb-1951, MRN [id number] ? ?PCP:  Smitty Cords, DO  ?[facility] Cardiologist:  Julien Nordmann, MD  ?[facility] Electrophysiologist:  Lanier Prude, MD  ? ? ?Interval History:   ? ?Ronald Roth is a 71 y.o. male who presents for a follow up visit.  He had a pacemaker implanted April 24, 2021.  Pacemaker had a left bundle area lead.  He has been doing well after implant. ? ? ? ?  ? ?Past Medical History:  ?Diagnosis Date  ? Anxiety   ? Coronary artery disease   ? Diabetes mellitus without complication (HCC)   ? GERD (gastroesophageal reflux disease)   ? Heart attack [facility]) 2012  ? Hypertension   ? S/P dual chamber pacemaker 04/25/2021  ? Sciatica   ? ? ?Past Surgical History:  ?Procedure Laterality Date  ? CARDIAC CATHETERIZATION    ? CORONARY ANGIOPLASTY WITH STENT PLACEMENT  2012  ? x 1  ? CORONARY ARTERY BYPASS GRAFT N/A 10/31/2020  ? Procedure: CORONARY ARTERY BYPASS GRAFTING (CABG)x 4 ON CARDIOPULMONARY BYPASS. LEFT INTERNAL MAMMARY ARTERY AND RIGHT GREATER SAPHENOUS VEIN. LIMA TO LAD, SVG TO DIAG., SVG TO PDA  AND PLB;  Surgeon: Linden Dolin, MD;  Location: [facility] OR;  Service: Open Heart Surgery;  Laterality: N/A;  ? ENDOVEIN HARVEST OF GREATER SAPHENOUS VEIN Right 10/31/2020  ? Procedure: ENDOVEIN HARVEST OF GREATER SAPHENOUS VEIN;  Surgeon: Linden Dolin, MD;  Location: [facility] OR;  Service: Open Heart Surgery;  Laterality: Right;  ? LEFT HEART CATH AND CORONARY ANGIOGRAPHY N/A 10/20/2020  ? Procedure: LEFT HEART CATH AND CORONARY ANGIOGRAPHY;  Surgeon: Yvonne Kendall, MD;  Location: [facility] INVASIVE CV LAB;  Service: Cardiovascular;  Laterality: N/A;  ? PACEMAKER IMPLANT N/A 04/24/2021  ? Procedure: PACEMAKER IMPLANT;  Surgeon: Lanier Prude, MD;  Location: [facility] INVASIVE CV LAB;  Service: Cardiovascular;  Laterality: N/A;  ? Ruptured disc    ? Sciatica   ? STENT PLACEMENT VASCULAR ([facility] HX)   2012  ? TEE WITHOUT CARDIOVERSION N/A 10/31/2020  ? Procedure: TRANSESOPHAGEAL ECHOCARDIOGRAM (TEE);  Surgeon: Linden Dolin, MD;  Location: [facility] OR;  Service: Open Heart Surgery;  Laterality: N/A;  ? ? ?Current Medications: ?Current Meds  ?Medication Sig  ? acetaminophen (TYLENOL) 500 MG tablet Take 2 tablets (1,000 mg total) by mouth every 6 (six) hours as needed.  ? aspirin EC 81 MG tablet Take 1 tablet (81 mg total) by mouth in the morning. Swallow whole.  ? atorvastatin (LIPITOR) 80 MG tablet Take 1 tablet (80 mg total) by mouth in the morning. Take 80 mg by mouth in the morning.  ? Cholecalciferol (VITAMIN D3) 125 MCG (5000 UT) CAPS Take 1 capsule (5,000 Units total) by mouth daily.  ? diphenhydrAMINE (BENADRYL) 25 MG tablet Take 75 mg by mouth at bedtime.  ? ezetimibe (ZETIA) 10 MG tablet Take 1 tablet (10 mg total) by mouth in the morning.  ? fenofibrate (TRICOR) 145 MG tablet Take 1 tablet (145 mg total) by mouth in the morning.  ? furosemide (LASIX) 20 MG tablet Take 1 tablet (20 mg total) by mouth as needed. For ankle swelling, weight gain, shortness of breath  ? JARDIANCE 25 MG TABS tablet Take 1 tablet (25 mg total) by mouth daily before breakfast.  ? nitroGLYCERIN (NITROSTAT) 0.4 MG SL tablet Place 1 tablet (0.4 mg total) under the tongue every 5 (  five) minutes x 3 doses as needed for chest pain.  ? omeprazole (PRILOSEC) 20 MG capsule Take 20 mg by mouth in the morning.  ? OZEMPIC, 0.25 OR 0.5 MG/DOSE, 2 MG/1.5ML SOPN INJECT SUBCUTANEOUSLY 0.5  MG EVERY WEEK (Patient taking differently: Inject 0.25 mg into the skin every Saturday.)  ? potassium chloride (KLOR-CON) 10 MEQ tablet Take 1 tablet (10 mEq total) by mouth as needed. Take with lasix  ? sacubitril-valsartan (ENTRESTO) 49-51 MG Take 1 tablet by mouth 2 (two) times daily.  ?  ? ?Allergies:   Beta adrenergic blockers  ? ?Social History  ? ?Socioeconomic History  ? Marital status: Married  ?  Spouse name: Not on file  ? Number of children: Not on  file  ? Years of education: Not on file  ? Highest education level: Not on file  ?Occupational History  ? Not on file  ?Tobacco Use  ? Smoking status: Former  ?  Packs/day: 1.00  ?  Years: 30.00  ?  Pack years: 30.00  ?  Types: Cigarettes  ?  Quit date: 04/18/1991  ?  Years since quitting: 30.2  ? Smokeless tobacco: Former  ?  Types: Chew  ?  Quit date: 04/16/1993  ?Vaping Use  ? Vaping Use: Never used  ?Substance and Sexual Activity  ? Alcohol use: Yes  ?  Comment: Occasionally   ? Drug use: Never  ? Sexual activity: Not on file  ?Other Topics Concern  ? Not on file  ?Social History Narrative  ? Not on file  ? ?Social Determinants of Health  ? ?Financial Resource Strain: Not on file  ?Food Insecurity: Not on file  ?Transportation Needs: Not on file  ?Physical Activity: Not on file  ?Stress: Not on file  ?Social Connections: Not on file  ?  ? ?Family History: ?The patient's family history includes Aortic aneurysm in his father; Arrhythmia in his mother; Hyperlipidemia in his sister; Hypertension in his sister; Pneumonia in his mother. ? ?ROS:   ?Please see the history of present illness.    ?All other systems reviewed and are negative. ? ?EKGs/Labs/Other Studies Reviewed:   ? ?The following studies were reviewed today: ? ?July 15, 2021 in clinic device interrogation personally reviewed ?Lead parameter stable. ?A sensed, V paced presenting rhythm ?Reprogrammed device for auto capture to maximize battery longevity.  Battery longevity estimated at 14.5 years ?Ventricular paced 98% ?Atrial paced less than 1% ? ?EKG:  The ekg ordered today demonstrates AV paced rhythm ? ?Recent Labs: ?11/01/2020: Magnesium 2.1 ?11/19/2020: ALT 9; TSH 1.110 ?03/06/2021: B Natriuretic Peptide 618.2 ?04/15/2021: BUN 20; Creatinine, Ser 1.42; Hemoglobin 15.0; Platelets 240; Potassium 5.0; Sodium 138  ?Recent Lipid Panel ?   ?Component Value Date/Time  ? CHOL 135 11/19/2020 1016  ? TRIG 149 11/19/2020 1016  ? HDL 30 (L) 11/19/2020 1016  ? CHOLHDL  4.5 11/19/2020 1016  ? LDLCALC 79 11/19/2020 1016  ? ? ?Physical Exam:   ? ?VS:  BP (!) 154/88 (BP Location: Left Arm, Patient Position: Sitting, Cuff Size: Normal)   Pulse 75   Ht 5' 10.5" (1.791 m)   Wt 205 lb (93 kg)   SpO2 96%   BMI 29.00 kg/m?    ? ?Wt Readings from Last 3 Encounters:  ?07/15/21 205 lb (93 kg)  ?06/22/21 204 lb 6 oz (92.7 kg)  ?04/28/21 201 lb (91.2 kg)  ?  ? ?GEN:  Well nourished, well developed in no acute distress ?HEENT: Normal ?NECK: No JVD;  No carotid bruits ?LYMPHATICS: No lymphadenopathy ?CARDIAC: RRR, no murmurs, rubs, gallops.  Pacemaker pocket well-healed ?RESPIRATORY:  Clear to auscultation without rales, wheezing or rhonchi  ?ABDOMEN: Soft, non-tender, non-distended ?MUSCULOSKELETAL:  No edema; No deformity  ?SKIN: Warm and dry ?NEUROLOGIC:  Alert and oriented x 3 ?PSYCHIATRIC:  Normal affect  ? ? ? ?  ? ?ASSESSMENT:   ? ?1. Heart block AV complete (HCC)   ?2. Essential hypertension   ?3. Cardiac pacemaker in situ   ? ?PLAN:   ? ?In order of problems listed above: ? ? ?#Heart block ?#Pacemaker in situ ?Device functioning appropriately.  Continue remote monitoring. ? ?#Hypertension ?Reading elevated today but he brings in a log of his blood pressures from home and these are under control.  Continue current regimen. ? ? ?Follow-up in 1 year with APP. ? ?Medication Adjustments/Labs and Tests Ordered: ?Current medicines are reviewed at length with the patient today.  Concerns regarding medicines are outlined above.  ?No orders of the defined types were placed in this encounter. ? ?No orders of the defined types were placed in this encounter. ? ? ? ?Signed, ?Steffanie Dunn, MD, [facility], [facility] ?07/15/2021 8:18 AM    ?Electrophysiology ?[facility] ?

## 2021-07-15 NOTE — Patient Instructions (Signed)
Medications: ?Your physician recommends that you continue on your current medications as directed. Please refer to the Current Medication list given to you today. ?*If you need a refill on your cardiac medications before your next appointment, please call your pharmacy* ? ?Lab Work: ?None. ?If you have labs (blood work) drawn today and your tests are completely normal, you will receive your results only by: ?MyChart Message (if you have MyChart) OR ?A paper copy in the mail ?If you have any lab test that is abnormal or we need to change your treatment, we will call you to review the results. ? ?Testing/Procedures: ?None. ? ?Follow-Up: ?At [facility], you and your health needs are our priority.  As part of our continuing mission to provide you with exceptional heart care, we have created designated Provider Care Teams.  These Care Teams include your primary Cardiologist (physician) and Advanced Practice Providers (APPs -  Physician Assistants and Nurse Practitioners) who all work together to provide you with the care you need, when you need it. ? ?Your physician wants you to follow-up in: 12 months with  ?one of the following Advanced Practice Providers on your designated Care Team:   ? ?Ward Givens, NP ?Eula Listen PA ?Cadence Fransico Michael PA ? ?  You will receive a reminder letter in the mail two months in advance. If you don't receive a letter, please call our office to schedule the follow-up appointment. ? ?Remote monitoring is used to monitor your Pacemaker from home. This monitoring reduces the number of office visits required to check your device to one time per year. It allows [place] to keep an eye on the functioning of your device to ensure it is working properly. You are scheduled for a device check from home on 07/27/21. You may send your transmission at any time that day. If you have a wireless device, the transmission will be sent automatically. After your physician reviews your transmission, you will receive a  postcard with your next transmission date. ? ?We recommend signing up for the patient portal called "MyChart".  Sign up information is provided on this After Visit Summary.  MyChart is used to connect with patients for Virtual Visits (Telemedicine).  Patients are able to view lab/test results, encounter notes, upcoming appointments, etc.  Non-urgent messages can be sent to your provider as well.   ?To learn more about what you can do with MyChart, go to [email or web address].   ? ?Any Other Special Instructions Will Be Listed Below (If Applicable). ? ?

## 2021-07-27 ENCOUNTER — Ambulatory Visit (INDEPENDENT_AMBULATORY_CARE_PROVIDER_SITE_OTHER): Payer: Managed Care, Other (non HMO)

## 2021-07-27 DIAGNOSIS — I255 Ischemic cardiomyopathy: Secondary | ICD-10-CM

## 2021-07-28 LAB — CUP PACEART REMOTE DEVICE CHECK
Battery Remaining Longevity: 144 mo
Battery Remaining Percentage: 100 %
Brady Statistic RA Percent Paced: 0 %
Brady Statistic RV Percent Paced: 98 %
Date Time Interrogation Session: 20230314105500
Implantable Lead Implant Date: 20221209
Implantable Lead Implant Date: 20221209
Implantable Lead Location: 753859
Implantable Lead Location: 753860
Implantable Lead Model: 7841
Implantable Lead Model: 7842
Implantable Lead Serial Number: 1100565
Implantable Lead Serial Number: 1189145
Implantable Pulse Generator Implant Date: 20221209
Lead Channel Impedance Value: 604 Ohm
Lead Channel Impedance Value: 674 Ohm
Lead Channel Pacing Threshold Amplitude: 0.5 V
Lead Channel Pacing Threshold Amplitude: 0.9 V
Lead Channel Pacing Threshold Pulse Width: 0.4 ms
Lead Channel Pacing Threshold Pulse Width: 0.4 ms
Lead Channel Setting Pacing Amplitude: 1.4 V
Lead Channel Setting Pacing Amplitude: 2 V
Lead Channel Setting Pacing Pulse Width: 0.4 ms
Lead Channel Setting Sensing Sensitivity: 3 mV
Pulse Gen Serial Number: 549513

## 2021-08-10 NOTE — Progress Notes (Signed)
Remote pacemaker transmission.   

## 2021-08-26 ENCOUNTER — Other Ambulatory Visit: Payer: Self-pay | Admitting: Family Medicine

## 2021-08-26 DIAGNOSIS — E1140 Type 2 diabetes mellitus with diabetic neuropathy, unspecified: Secondary | ICD-10-CM

## 2021-08-27 NOTE — Telephone Encounter (Signed)
Requested Prescriptions  ?Pending Prescriptions Disp Refills  ?? OZEMPIC, 0.25 OR 0.5 MG/DOSE, 2 MG/1.5ML SOPN [Pharmacy Med Name: Ozempic (0.25 or 0.5 MG/DOSE) 2 MG/1.5ML Subcutaneous Solution Pen-injector] 4.5 mL 3  ?  Sig: INJECT SUBCUTANEOUSLY 0.5 MG  EVERY WEEK  ?  ? Endocrinology:  Diabetes - GLP-1 Receptor Agonists - semaglutide Failed - 08/26/2021 11:44 PM  ?  ?  Failed - HBA1C in normal range and within 180 days  ?  Hemoglobin A1C  ?Date Value Ref Range Status  ?03/30/2021 7.1 (A) 4.0 - 5.6 % Final  ?01/29/2020 7.3  Final  ?  Comment:  ?  [facility]  ? ?Hgb A1c MFr Bld  ?Date Value Ref Range Status  ?11/19/2020 7.3 (H) 4.8 - 5.6 % Final  ?  Comment:  ?           Prediabetes: 5.7 - 6.4 ?         Diabetes: >6.4 ?         Glycemic control for adults with diabetes: <7.0 ?  ?   ?  ?  Failed - Cr in normal range and within 360 days  ?  Creatinine, Ser  ?Date Value Ref Range Status  ?04/15/2021 1.42 (H) 0.76 - 1.27 mg/dL Final  ?   ?  ?  Passed - Valid encounter within last 6 months  ?  Recent Outpatient Visits   ?      ? 4 months ago Acute cystitis with hematuria  ? [place], DO  ? 5 months ago Type 2 diabetes, controlled, with neuropathy ([place])  ? [place], DO  ? 9 months ago Type 2 diabetes, controlled, with neuropathy ([place])  ? [place], DO  ? 1 year ago Type 2 diabetes, controlled, with neuropathy ([place])  ? [place], DO  ?  ?  ?Future Appointments   ?        ? In 3 months Dunn, Areta Haber, PA-C [facility]  ?  ? ?  ?  ?  ? ? ?

## 2021-09-20 ENCOUNTER — Other Ambulatory Visit: Payer: Self-pay | Admitting: Cardiovascular Disease

## 2021-10-08 IMAGING — CR DG CHEST 2V
2 series · 2 of 2 positions shown · non-contrast
Comparison: None.

CLINICAL DATA: Preoperative exam for CABG. Coronary artery disease.

EXAM:
CHEST - 2 VIEW

[w chest pa]
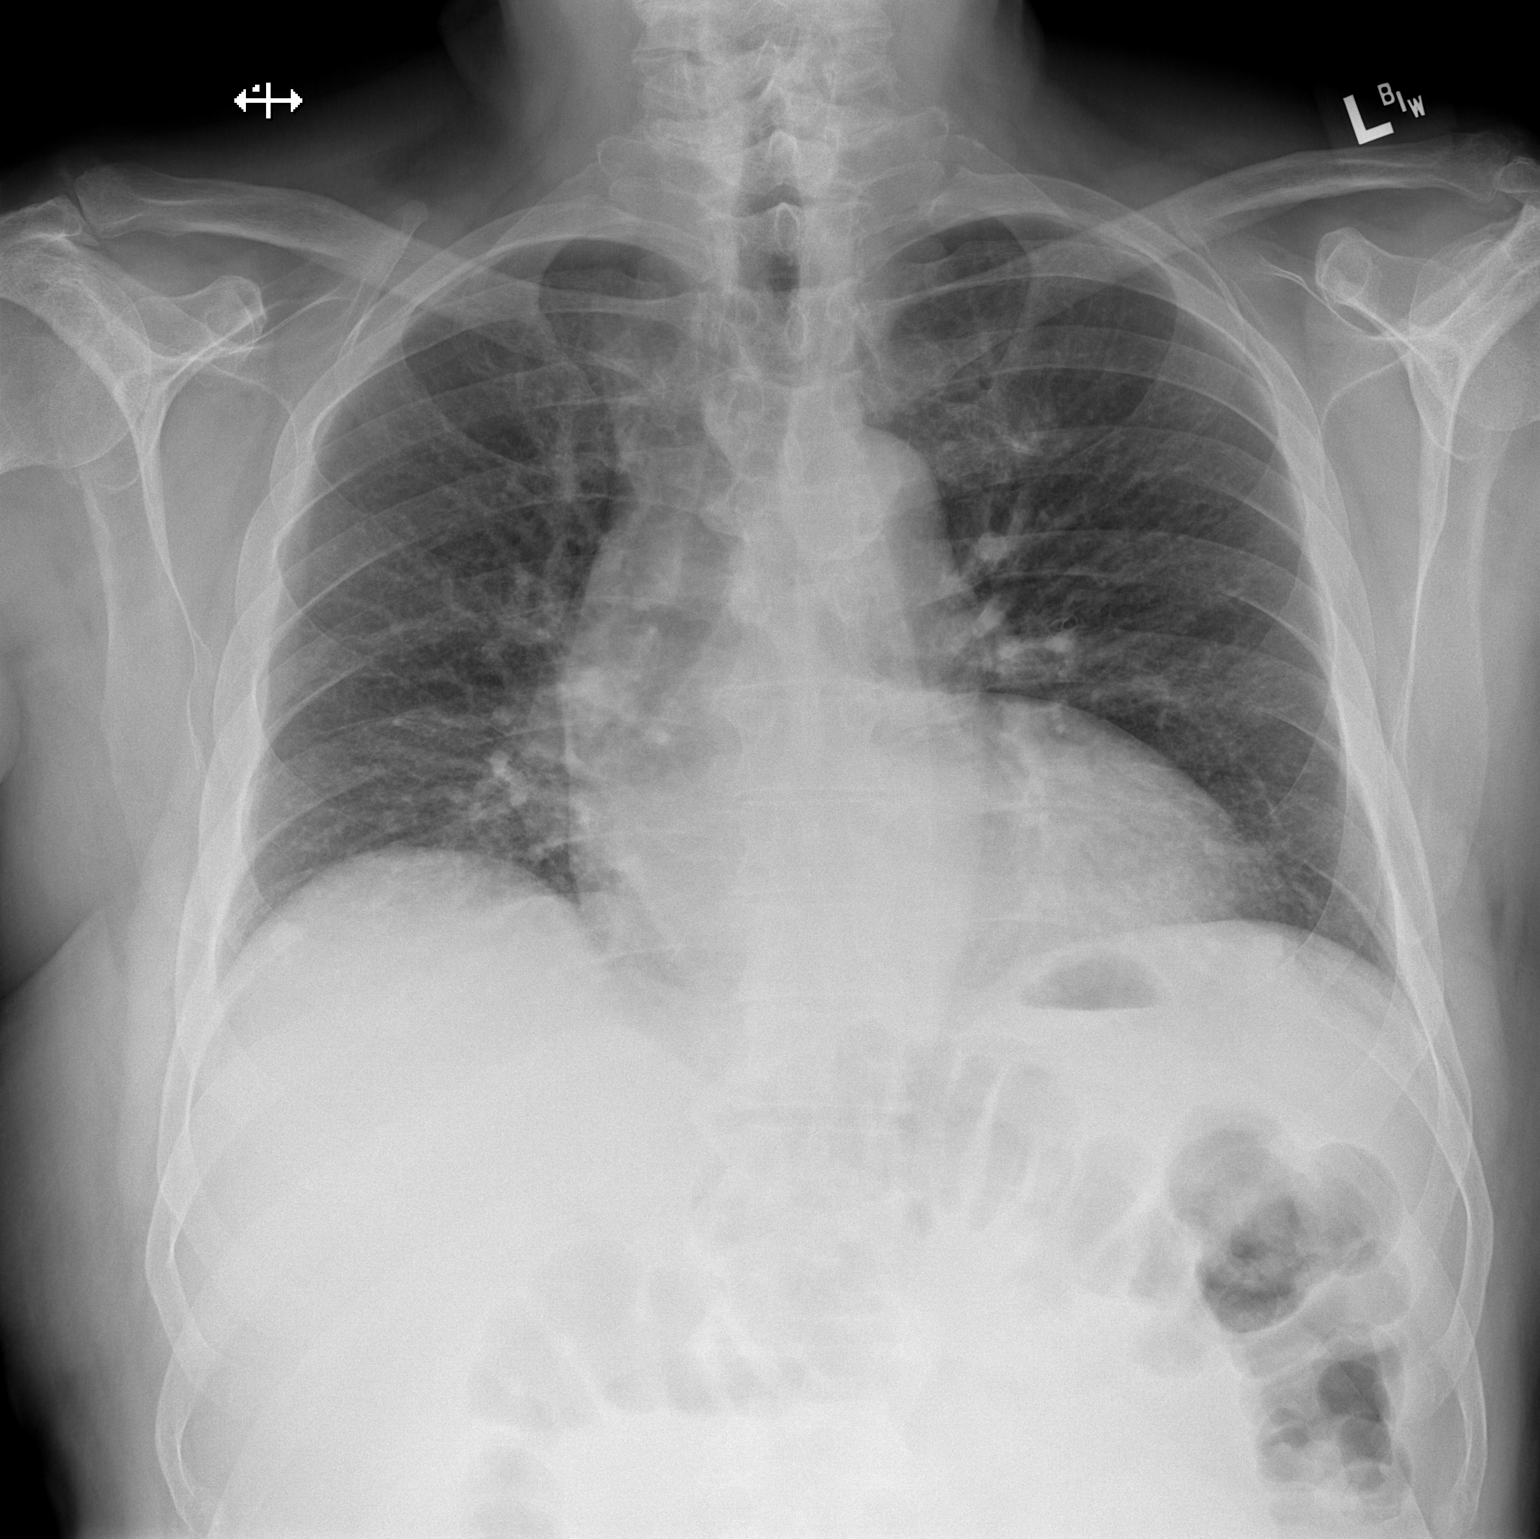

[w chest lat]
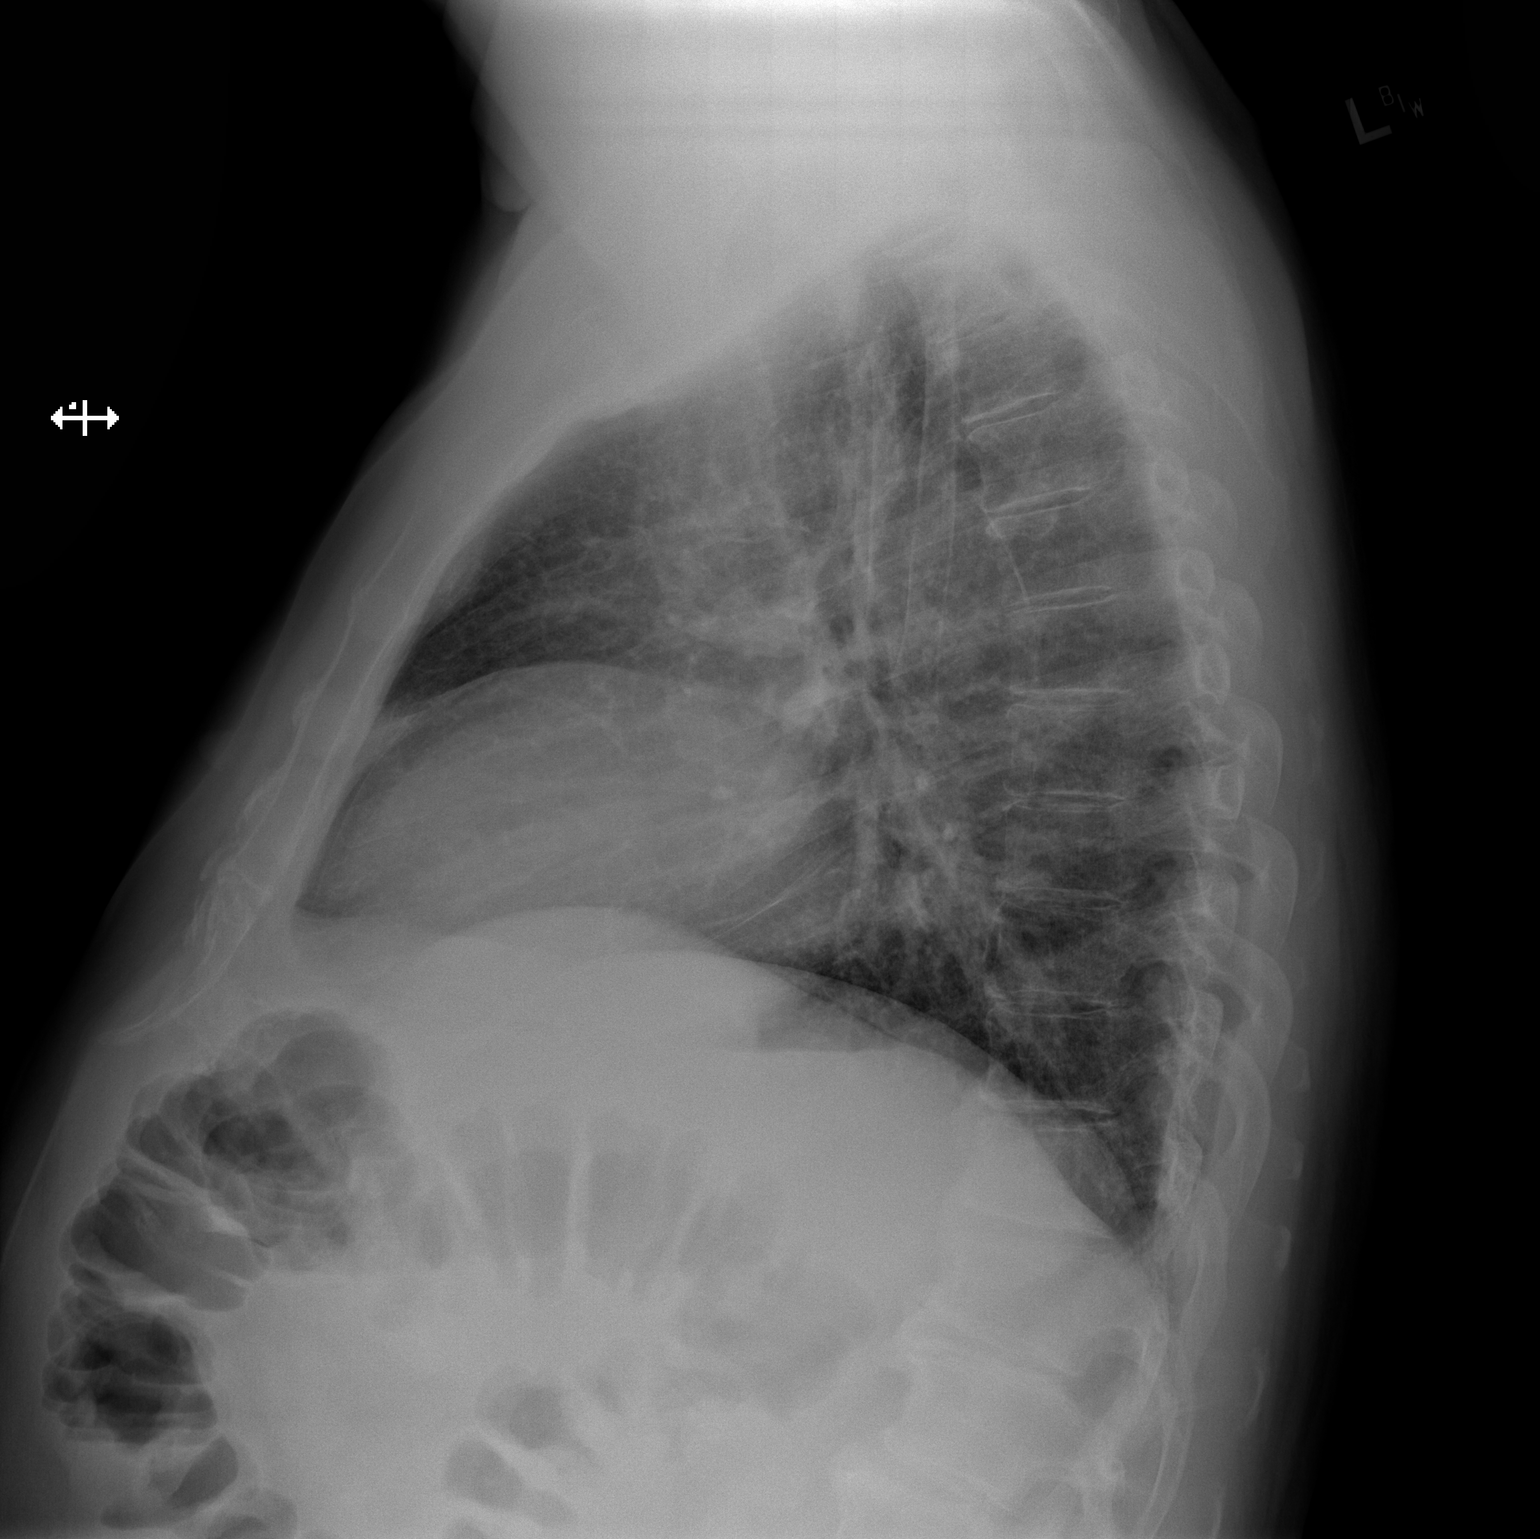

[2 of 2 positions shown; findings below may reference images not displayed]

FINDINGS: The heart size and mediastinal contours are within normal limits. No
focal airspace consolidation, pleural effusion, or pneumothorax. The
visualized skeletal structures are unremarkable.
IMPRESSION: No active cardiopulmonary disease.

## 2021-10-20 ENCOUNTER — Other Ambulatory Visit: Payer: Self-pay | Admitting: Family Medicine

## 2021-10-20 DIAGNOSIS — E1140 Type 2 diabetes mellitus with diabetic neuropathy, unspecified: Secondary | ICD-10-CM

## 2021-10-21 NOTE — Telephone Encounter (Signed)
Refills available. Early request sent via Interface. Requested Prescriptions  Pending Prescriptions Disp Refills  . OZEMPIC, 0.25 OR 0.5 MG/DOSE, 2 MG/3ML SOPN [Pharmacy Med Name: OZEMPIC  0.25MG  0.5MG  SOLUTION PEN-INJECTOR  PEN DOSE] 9 mL 3    Sig: INJECT SUBCUTANEOUSLY 0.5 MG  EVERY WEEK     There is no refill protocol information for this order

## 2021-10-23 ENCOUNTER — Other Ambulatory Visit: Payer: Self-pay | Admitting: Family Medicine

## 2021-10-23 DIAGNOSIS — E1140 Type 2 diabetes mellitus with diabetic neuropathy, unspecified: Secondary | ICD-10-CM

## 2021-10-23 NOTE — Telephone Encounter (Signed)
Call to pharmacy- patient received 3 month supply that will be due RF in July- advised we have to wait until RF is due to fill- will address RF in July Requested Prescriptions  Pending Prescriptions Disp Refills  . OZEMPIC, 0.25 OR 0.5 MG/DOSE, 2 MG/3ML SOPN [Pharmacy Med Name: OZEMPIC  0.25MG  0.5MG  SOLUTION PEN-INJECTOR  PEN DOSE] 9 mL 3    Sig: INJECT SUBCUTANEOUSLY 0.5 MG  EVERY WEEK     There is no refill protocol information for this order

## 2021-10-26 ENCOUNTER — Ambulatory Visit (INDEPENDENT_AMBULATORY_CARE_PROVIDER_SITE_OTHER): Payer: Managed Care, Other (non HMO)

## 2021-10-26 DIAGNOSIS — I442 Atrioventricular block, complete: Secondary | ICD-10-CM

## 2021-10-30 LAB — CUP PACEART REMOTE DEVICE CHECK
Battery Remaining Longevity: 156 mo
Battery Remaining Percentage: 100 %
Brady Statistic RA Percent Paced: 0 %
Brady Statistic RV Percent Paced: 98 %
Date Time Interrogation Session: 20230615041100
Implantable Lead Implant Date: 20221209
Implantable Lead Implant Date: 20221209
Implantable Lead Location: 753859
Implantable Lead Location: 753860
Implantable Lead Model: 7841
Implantable Lead Model: 7842
Implantable Lead Serial Number: 1100565
Implantable Lead Serial Number: 1189145
Implantable Pulse Generator Implant Date: 20221209
Lead Channel Impedance Value: 635 Ohm
Lead Channel Impedance Value: 640 Ohm
Lead Channel Pacing Threshold Amplitude: 0.4 V
Lead Channel Pacing Threshold Amplitude: 0.9 V
Lead Channel Pacing Threshold Pulse Width: 0.4 ms
Lead Channel Pacing Threshold Pulse Width: 0.4 ms
Lead Channel Setting Pacing Amplitude: 1.4 V
Lead Channel Setting Pacing Amplitude: 2 V
Lead Channel Setting Pacing Pulse Width: 0.4 ms
Lead Channel Setting Sensing Sensitivity: 3 mV
Pulse Gen Serial Number: 549513

## 2021-11-11 NOTE — Progress Notes (Signed)
Remote pacemaker transmission.   

## 2021-12-18 ENCOUNTER — Ambulatory Visit: Payer: Managed Care, Other (non HMO) | Admitting: Physician Assistant

## 2021-12-22 NOTE — Progress Notes (Signed)
Cardiology Office Note    Date:  12/25/2021   ID:  Zaveon Gillen, DOB 1950/11/18, MRN [id number]  PCP:  Smitty Cords, DO  Cardiologist:  Julien Nordmann, MD  Electrophysiologist:  Lanier Prude, MD   Chief Complaint: Follow-up  History of Present Illness:   Ronald Roth is a 71 y.o. male with history of CAD with MI in 2012 status post PCI status post four-vessel CABG in 10/2020 with LIMA to LAD, SVG to diagonal, SVG to rPDA and rPLB, HFrEF secondary to ICM, complete heart block status post PPM in 04/2021, carotid artery stenosis, DM2, HTN, HLD, strong family history of CAD, and prior tobacco use who presents for follow-up of his CAD and cardiomyopathy.  He was seen as a new patient on 09/16/2020 to establish care given his history of CAD.  At that time, he noted exertional fatigue, shortness of breath, and angina.  In this setting, he underwent Lexiscan MPI on 09/25/2020 which showed no significant ischemia with a large perfusion defect in the mid to distal anteroseptal wall, mid to distal inferior wall, and apical region with associated hypokinesis, EF 32%.  CT attenuation corrected images showed moderate aortic atherosclerosis and three-vessel coronary artery calcification.  Overall, this was a moderate risk scan given depressed EF and a large defect.  Subsequent echo on 10/08/2020 showed an EF of 25 to 30%, global hypokinesis, mild LVH, mildly reduced RV systolic function with normal RV cavity size, and mild mitral regurgitation.  Diagnostic LHC on 10/20/2020 showed severe three-vessel CAD including a long segment of diffuse mid LAD disease of 50 to 90% stenosis with moderate to severe associated calcification and occlusion of branch of D1, small LCx with CTO of OM1, and a large RCA with 70 to 90% tandem lesions involving the rPDA and 60% rPL stenosis.  The previously placed RPL stent was patent with mild in-stent restenosis of approximately 20%.  Upper normal LV filling pressure at  approximately 15 mmHg.  Given severe multivessel disease he was referred to CVTS for consideration of CABG.  He was evaluated by CVTS on 10/27/2020 with recommendation to proceed with CABG.  Preoperative carotid duplex showed 40 to 59% RICA stenosis with no significant LICA stenosis.  He underwent four-vessel CABG on 10/31/2020.  Postoperative course was notable for brief episode of high-grade AV block, volume overload with symptomatic improvement with diuresis, AKI, and expected ABL anemia.  Subsequent outpatient cardiac monitoring in 11/2020 showed a predominant rhythm of sinus with an average rate of 96 bpm and a range of 75 to 193 bpm, first-degree AV block, bundle branch block, 3 episodes of SVT with the longest lasting 19.3 seconds.  He underwent Lexiscan MPI in 02/2021 which showed a moderate in size, severe, fixed inferior wall and apical lateral defect consistent with scar with no significant ischemia.  LVEF 45 to 50%.  Overall, this was an intermediate risk study.  Repeat outpatient cardiac monitoring in 02/2021 showed sinus rhythm with an average rate of 48 bpm with a range of 21 to 85 bpm, bundle branch block, and a 62 pauses with the longest lasting 11.4 seconds.  Echo in 04/2021 demonstrated an EF of 45 to 50%, septal wall hypokinesis consistent with bundle branch block, normal RV systolic function and ventricular cavity size, PASP 36.8 mmHg, mildly dilated left atrium, mild mitral regurgitation, aortic valve sclerosis without evidence of stenosis.  Heart rate during the study was 34 bpm.  He was evaluated by EP and underwent PPM implantation in 04/2021.  He comes in doing very well from a cardiac perspective, and is without symptoms of angina or decompensation.  No dizziness, presyncope, or syncope.  He remains very active at baseline.  Mowing the lawn, walking his dog, and playing fetch with his dog.  He continues exercise on a regular basis as well.  Adherent and tolerating cardiac medications.   Blood pressure at home will range from the 140s to 170s systolic.  He has not needed any as needed furosemide.  Overall, he feels like he is doing very well and does not have any active issues or concerns at this time.   Labs independently reviewed: 03/2021 - Hgb 15.0, PLT 240, BUN 20, serum creatinine 1.42, potassium 5.0, A1c 7.1 11/2020 - TSH normal, albumin 4.6, AST/ALT normal, TC 135, TG 149, HDL 30, LDL 79  Past Medical History:  Diagnosis Date   Anxiety    Coronary artery disease    Diabetes mellitus without complication (HCC)    GERD (gastroesophageal reflux disease)    Heart attack (HCC) 2012   Hypertension    S/P dual chamber pacemaker 04/25/2021   Sciatica     Past Surgical History:  Procedure Laterality Date   CARDIAC CATHETERIZATION     CORONARY ANGIOPLASTY WITH STENT PLACEMENT  2012   x 1   CORONARY ARTERY BYPASS GRAFT N/A 10/31/2020   Procedure: CORONARY ARTERY BYPASS GRAFTING (CABG)x 4 ON CARDIOPULMONARY BYPASS. LEFT INTERNAL MAMMARY ARTERY AND RIGHT GREATER SAPHENOUS VEIN. LIMA TO LAD, SVG TO DIAG., SVG TO PDA  AND PLB;  Surgeon: Linden Dolin, MD;  Location: [facility] OR;  Service: Open Heart Surgery;  Laterality: N/A;   ENDOVEIN HARVEST OF GREATER SAPHENOUS VEIN Right 10/31/2020   Procedure: ENDOVEIN HARVEST OF GREATER SAPHENOUS VEIN;  Surgeon: Linden Dolin, MD;  Location: [facility] OR;  Service: Open Heart Surgery;  Laterality: Right;   LEFT HEART CATH AND CORONARY ANGIOGRAPHY N/A 10/20/2020   Procedure: LEFT HEART CATH AND CORONARY ANGIOGRAPHY;  Surgeon: Yvonne Kendall, MD;  Location: [facility] INVASIVE CV LAB;  Service: Cardiovascular;  Laterality: N/A;   PACEMAKER IMPLANT N/A 04/24/2021   Procedure: PACEMAKER IMPLANT;  Surgeon: Lanier Prude, MD;  Location: [facility] INVASIVE CV LAB;  Service: Cardiovascular;  Laterality: N/A;   Ruptured disc     Sciatica    STENT PLACEMENT VASCULAR ([facility] HX)  2012   TEE WITHOUT CARDIOVERSION N/A 10/31/2020   Procedure: TRANSESOPHAGEAL  ECHOCARDIOGRAM (TEE);  Surgeon: Linden Dolin, MD;  Location: [facility] OR;  Service: Open Heart Surgery;  Laterality: N/A;    Current Medications: Current Meds  Medication Sig   aspirin EC 81 MG tablet Take 1 tablet (81 mg total) by mouth in the morning. Swallow whole.   atorvastatin (LIPITOR) 80 MG tablet Take 1 tablet (80 mg total) by mouth in the morning. Take 80 mg by mouth in the morning.   Cholecalciferol (VITAMIN D3) 125 MCG (5000 UT) CAPS Take 1 capsule (5,000 Units total) by mouth daily.   diphenhydramine-acetaminophen (TYLENOL PM) 25-500 MG TABS tablet Take 1 tablet by mouth at bedtime.   ezetimibe (ZETIA) 10 MG tablet TAKE 1 TABLET BY MOUTH IN  THE MORNING   fenofibrate (TRICOR) 145 MG tablet Take 1 tablet (145 mg total) by mouth in the morning.   furosemide (LASIX) 20 MG tablet Take 1 tablet (20 mg total) by mouth as needed. For ankle swelling, weight gain, shortness of breath   JARDIANCE 25 MG TABS tablet Take 1 tablet (25 mg total) by mouth  daily before breakfast.   nitroGLYCERIN (NITROSTAT) 0.4 MG SL tablet Place 1 tablet (0.4 mg total) under the tongue every 5 (five) minutes x 3 doses as needed for chest pain.   omeprazole (PRILOSEC) 20 MG capsule Take 20 mg by mouth in the morning.   OZEMPIC, 0.25 OR 0.5 MG/DOSE, 2 MG/1.5ML SOPN INJECT SUBCUTANEOUSLY 0.5 MG  EVERY WEEK   potassium chloride (KLOR-CON) 10 MEQ tablet Take 1 tablet (10 mEq total) by mouth as needed. Take with lasix   sacubitril-valsartan (ENTRESTO) 97-103 MG Take 1 tablet by mouth 2 (two) times daily.   [DISCONTINUED] sacubitril-valsartan (ENTRESTO) 49-51 MG Take 1 tablet by mouth 2 (two) times daily.    Allergies:   Beta adrenergic blockers   Social History   Socioeconomic History   Marital status: Married    Spouse name: Not on file   Number of children: Not on file   Years of education: Not on file   Highest education level: Not on file  Occupational History   Not on file  Tobacco Use   Smoking  status: Former    Packs/day: 1.00    Years: 30.00    Total pack years: 30.00    Types: Cigarettes    Quit date: 04/18/1991    Years since quitting: 30.7   Smokeless tobacco: Former    Types: Chew    Quit date: 04/16/1993  Vaping Use   Vaping Use: Never used  Substance and Sexual Activity   Alcohol use: Yes    Comment: Occasionally    Drug use: Never   Sexual activity: Not on file  Other Topics Concern   Not on file  Social History Narrative   Not on file   Social Determinants of Health   Financial Resource Strain: Not on file  Food Insecurity: Not on file  Transportation Needs: Not on file  Physical Activity: Not on file  Stress: Not on file  Social Connections: Not on file     Family History:  The patient's family history includes Aortic aneurysm in his father; Arrhythmia in his mother; Heart disease in his brother; Hyperlipidemia in his sister; Hypertension in his sister; Pneumonia in his mother.  ROS:   12-point review of systems is negative unless otherwise noted in the HPI.   EKGs/Labs/Other Studies Reviewed:    Studies reviewed were summarized above. The additional studies were reviewed today:  Lexiscan MPI 09/25/2020: Pharmacological myocardial perfusion imaging study with no significant ischemia Large perfusion defect in the mid to distal anteroseptal wall, mid to distal inferior wall and apical region Hypokinesis of the region detailed above,, EF estimated at 32% No EKG changes concerning for ischemia at peak stress or in recovery. CT attenuation correction images with moderate aortic atherosclerosis, three-vessel coronary calcification Moderate risk scan given depressed ejection fraction, large defect __________   2D echo 10/08/2020: 1. Left ventricular ejection fraction, by estimation, is 25 to 30%. The  left ventricle has severely decreased function. The left ventricle  demonstrates global hypokinesis. There is mild left ventricular  hypertrophy. Left  ventricular diastolic parameters   are indeterminate.   2. Right ventricular systolic function is mildly reduced. The right  ventricular size is normal.   3. The mitral valve is normal in structure. Mild mitral valve  regurgitation.   4. The aortic valve was not well visualized. Aortic valve regurgitation  is not visualized.  __________   LHC 10/20/2020: Conclusions: Severe three-vessel coronary artery disease, including long segment of diffuse mid LAD disease of  50-90% stenosis with moderate to severe associated calcification and occlusion of branch of D1, small LCx with chronic total occlusion of OM1, and large RCA with 70-90% tandem lesions involving rPDA and 60% rPL stenosis. Patient rPL stent with mild in-stent restenosis (~20%). Upper normal left ventricular filling pressure (LVEDP ~15 mmHg).   Recommendations: Outpatient cardiac surgery consultation for CABG, given three-vessel coronary artery disease, reduced LVEF, and diabetes mellitus. Continue current heart failure and antianginal regimen. Aggressive secondary prevention. __________   Pre-CABG vascular studies 10/29/2020: Summary:  Right Carotid: Velocities in the right ICA are consistent with a 40-59%                 stenosis.   Left Carotid: Velocities in the left ICA are consistent with a 1-39%  stenosis.  Vertebrals:  Bilateral vertebral arteries demonstrate antegrade flow. Left               vertebral artery demonstrates high resistant flow.  Subclavians: Normal flow hemodynamics were seen in bilateral subclavian               arteries.   Right ABI: Resting right ankle-brachial index is within normal range. No  evidence of significant right lower extremity arterial disease.  Left ABI: Resting left ankle-brachial index is within normal range. No  evidence of significant left lower extremity arterial disease.  Right Upper Extremity: Doppler waveforms remain within normal limits with  right radial compression. Doppler  waveform obliterate with right ulnar  compression.  Left Upper Extremity: Doppler waveform obliterate with left radial  compression. Doppler waveforms remain within normal limits with left ulnar  compression.  __________   Intraoperative TEE 10/31/2020: POST-OP IMPRESSIONS  limited Post-CPB examination: The patient separated easily from CPB.  - Left Ventricle: The left ventricle shows improvement in global  contractility.  There is some recovery of contractility in the inferior wall, although it  remains hypokinetic. The septal area remains akinetic. Visually, the EF  has  imroved to approximately 30%.  - Right Ventricle: The right ventricular function appears normal,  unchanged from  pre-bypass images.  - Aortic Valve: The aortic valve function appears normal and unchanged  from  pre-bypass images.  - Mitral Valve: The mitral valve function appears normal, and unchanged  from  pre-bypass images.  - Tricuspid Valve: The tricuspid valve function appears unchanged from  pre-bypass images.  __________  Luci Bank patch 11/2020: Patient had a min HR of 75 bpm, max HR of 193 bpm, and avg HR of 96 bpm.  Predominant underlying rhythm was Sinus Rhythm.  First Degree AV Block was present.  Bundle Branch Block/IVCD was present.    3 Supraventricular Tachycardia runs occurred, the run with the fastest interval lasting 13 beats with a max rate of 193 bpm, the longest lasting 19.3 secs with an avg rate of 117 bpm.    Isolated SVEs were rare (<1.0%), SVE Couplets were rare (<1.0%), and SVE Triplets were rare (<1.0%).  Isolated VEs were occasional  (1.6%, 30479), VE Couplets were rare (<1.0%, 3115), and VE Triplets were rare (<1.0%, 1).   One patient triggered event, not associated with significant arrhytmia __________  Eugenie Birks MPI 03/13/2021:   Abnormal pharmacologic myocardial perfusion stress test.   There is a moderate in size, severe, fixed inferior wall and apical lateral defect  consistent with scar.   No significant ischemia is identified.   Left ventricular systolic function is mildly reduced (LVEF 45-50%) with inferior hypokinesis.   Attenuation correction CT demonstrates coronary  artery calcification and aortic atherosclerosis as well as post CABG findings.   This is an intermediate risk study. __________  Luci Bank patch 02/2021: Normal sinus rhythm Patient had a min HR of 21 bpm, max HR of 85 bpm, and avg HR of 48 bpm.   Bundle Branch Block/IVCD was present.   62 Pauses occurred, the longest lasting 11.4 secs (5 bpm).  Some Pauses occurred due to Complete Heart Block and High Grade AV Block.    16073 episode(s) of AV Block (High Grade, 2nd Mobitz II and 3rd) occurred, lasting a total of 5 days 12 hours.    No Isolated SVEs, SVE Couplets, or SVE Triplets were present. Isolated VEs were rare (<1.0%), and no VE Couplets or VE Triplets were present.    Metoprolol succinate 50 mg daily held on March 24, 2021 when seen in clinic Will request telemetry strip on pause dated November 10 Suggest repeat ZIO live monitor for evaluation of arrhythmia off metoprolol __________  2D echo 04/17/2021: 1. Left ventricular ejection fraction, by estimation, is 45 to 50%. The  left ventricle has mildly decreased function. The left ventricle  demonstrates regional wall motion abnormalities (septal wall hypokinesis  consistent with conduction  abnormality/bundle branch block). Left ventricular diastolic parameters  are indeterminate.   2. Right ventricular systolic function is normal. The right ventricular  size is normal. There is mildly elevated pulmonary artery systolic  pressure. The estimated right ventricular systolic pressure is 36.8 mmHg.   3. Left atrial size was mildly dilated.   4. The mitral valve is normal in structure. Mild mitral valve  regurgitation. No evidence of mitral stenosis.   5. The aortic valve is normal in structure. Aortic valve regurgitation is   not visualized. Aortic valve sclerosis is present, with no evidence of  aortic valve stenosis.   6. The inferior vena cava is normal in size with greater than 50%  respiratory variability, suggesting right atrial pressure of 3 mmHg.   7. Marked bradycardia, rate 34 bpm    EKG:  EKG is ordered today.  The EKG ordered today demonstrates A-sensed, V-paced rhythm, 82 bpm  Recent Labs: 03/06/2021: B Natriuretic Peptide 618.2 04/15/2021: BUN 20; Creatinine, Ser 1.42; Hemoglobin 15.0; Platelets 240; Potassium 5.0; Sodium 138  Recent Lipid Panel    Component Value Date/Time   CHOL 135 11/19/2020 1016   TRIG 149 11/19/2020 1016   HDL 30 (L) 11/19/2020 1016   CHOLHDL 4.5 11/19/2020 1016   LDLCALC 79 11/19/2020 1016    PHYSICAL EXAM:    VS:  BP (!) 150/92 (BP Location: Left Arm, Patient Position: Sitting, Cuff Size: Large)   Pulse 82   Ht 5' 10.5" (1.791 m)   Wt 208 lb (94.3 kg)   SpO2 98%   BMI 29.42 kg/m   BMI: Body mass index is 29.42 kg/m.  Physical Exam Constitutional:      Appearance: He is well-developed.  HENT:     Head: Normocephalic and atraumatic.  Eyes:     General:        Right eye: No discharge.        Left eye: No discharge.  Neck:     Vascular: No JVD.  Cardiovascular:     Rate and Rhythm: Normal rate and regular rhythm.     Pulses:          Posterior tibial pulses are 2+ on the right side and 2+ on the left side.     Heart sounds: Normal heart sounds,  S1 normal and S2 normal. Heart sounds not distant. No midsystolic click and no opening snap. No murmur heard.    No friction rub.  Pulmonary:     Effort: Pulmonary effort is normal. No respiratory distress.     Breath sounds: Normal breath sounds. No decreased breath sounds, wheezing or rales.  Chest:     Chest wall: No tenderness.  Abdominal:     General: There is no distension.  Musculoskeletal:     Cervical back: Normal range of motion.     Right lower leg: No edema.     Left lower leg: No edema.   Skin:    General: Skin is warm and dry.     Nails: There is no clubbing.  Neurological:     Mental Status: He is alert and oriented to person, place, and time.  Psychiatric:        Speech: Speech normal.        Behavior: Behavior normal.        Thought Content: Thought content normal.        Judgment: Judgment normal.     Wt Readings from Last 3 Encounters:  12/25/21 208 lb (94.3 kg)  07/15/21 205 lb (93 kg)  06/22/21 204 lb 6 oz (92.7 kg)     ASSESSMENT & PLAN:   CAD status post CABG without angina: He continues to do very well, and is without symptoms of angina or decompensation.  He remains very active.  Continue aggressive risk factor modification and secondary prevention including aspirin and atorvastatin.  No indication for ischemic testing at this time.  HFrEF secondary to ICM: He appears euvolemic and well compensated.  Echo in 04/2021 demonstrated an improved EF of 45-50 (TEE intraoperatively with an EF of 30%).  Titrate Entresto to 97/103 mg twice daily.  He otherwise remains on Jardiance and as needed furosemide.  He has not been maintained on a beta-blocker in the context of prior complete heart block.  However, he is now status post pacemaker.  Primary cardiologist prefers to defer beta-blocker to EP.  In follow-up, could consider addition of MRA, though would need to keep a close eye on his renal function.  He has not needed any as needed furosemide.  Complete heart block: Status post PPM in 04/2021.  Device functioning normally.  Follow-up with EP as directed.  HTN: Blood pressure is elevated mildly in the office, consistent with prior readings.  BP at home runs on the higher side.  Titrate Entresto as outlined above.  HLD: LDL 79 in 11/2020.  Placed future orders for fasting lipid panel and CMP.  He remains on atorvastatin.  Carotid artery stenosis: Pre-CABG ultrasound showed 40 to 59% RICA stenosis with no left ICA stenosis.  He remains on aspirin and atorvastatin.   Look to follow-up carotid artery ultrasound at next visit.   Disposition: F/u with Dr. Mariah MillingGollan or an APP in 6 months, and EP as directed.   Medication Adjustments/Labs and Tests Ordered: Current medicines are reviewed at length with the patient today.  Concerns regarding medicines are outlined above. Medication changes, Labs and Tests ordered today are summarized above and listed in the Patient Instructions accessible in Encounters.   Signed, Eula Listenyan Adit Riddles, PA-C 12/25/2021 10:08 AM     [facility] [place] [address] [place], [place] [address] [phone]

## 2021-12-25 ENCOUNTER — Ambulatory Visit (INDEPENDENT_AMBULATORY_CARE_PROVIDER_SITE_OTHER): Payer: Managed Care, Other (non HMO) | Admitting: Physician Assistant

## 2021-12-25 ENCOUNTER — Encounter: Payer: Self-pay | Admitting: Physician Assistant

## 2021-12-25 VITALS — BP 150/92 | HR 82 | Ht 70.5 in | Wt 208.0 lb

## 2021-12-25 DIAGNOSIS — I2510 Atherosclerotic heart disease of native coronary artery without angina pectoris: Secondary | ICD-10-CM | POA: Diagnosis not present

## 2021-12-25 DIAGNOSIS — I5020 Unspecified systolic (congestive) heart failure: Secondary | ICD-10-CM

## 2021-12-25 DIAGNOSIS — I255 Ischemic cardiomyopathy: Secondary | ICD-10-CM | POA: Diagnosis not present

## 2021-12-25 DIAGNOSIS — Z951 Presence of aortocoronary bypass graft: Secondary | ICD-10-CM

## 2021-12-25 DIAGNOSIS — E785 Hyperlipidemia, unspecified: Secondary | ICD-10-CM

## 2021-12-25 DIAGNOSIS — I442 Atrioventricular block, complete: Secondary | ICD-10-CM

## 2021-12-25 DIAGNOSIS — Z950 Presence of cardiac pacemaker: Secondary | ICD-10-CM

## 2021-12-25 DIAGNOSIS — I10 Essential (primary) hypertension: Secondary | ICD-10-CM

## 2021-12-25 MED ORDER — ENTRESTO 97-103 MG PO TABS: 1.0000 | ORAL_TABLET | Freq: Two times a day (BID) | ORAL | 11 refills | Status: AC

## 2021-12-25 NOTE — Patient Instructions (Signed)
Medication Instructions:  Your physician has recommended you make the following change in your medication:   INCREASE Entresto to 97-103 mg twice a day  *If you need a refill on your cardiac medications before your next appointment, please call your pharmacy*   Lab Work: CMET & Lipid panel to be done at your convenience  If you have labs (blood work) drawn today and your tests are completely normal, you will receive your results only by: MyChart Message (if you have MyChart) OR A paper copy in the mail If you have any lab test that is abnormal or we need to change your treatment, we will call you to review the results.   Testing/Procedures: None   Follow-Up: At [facility], you and your health needs are our priority.  As part of our continuing mission to provide you with exceptional heart care, we have created designated Provider Care Teams.  These Care Teams include your primary Cardiologist (physician) and Advanced Practice Providers (APPs -  Physician Assistants and Nurse Practitioners) who all work together to provide you with the care you need, when you need it.   Your next appointment:   6 month(s)  The format for your next appointment:   In Person  Provider:   Julien Nordmann, MD or Eula Listen, PA-C      Important Information About Sugar

## 2022-01-01 ENCOUNTER — Other Ambulatory Visit: Payer: Self-pay | Admitting: Physician Assistant

## 2022-01-02 LAB — LIPID PANEL
Chol/HDL Ratio: 4.7 ratio (ref 0.0–5.0)
Cholesterol, Total: 122 mg/dL (ref 100–199)
HDL: 26 mg/dL — ABNORMAL LOW (ref >39)
LDL Chol Calc (NIH): 72 mg/dL (ref 0–99)
Triglycerides: 133 mg/dL (ref 0–149)
VLDL Cholesterol Cal: 24 mg/dL (ref 5–40)

## 2022-01-02 LAB — COMPREHENSIVE METABOLIC PANEL
ALT: 15 IU/L (ref 0–44)
AST: 13 IU/L (ref 0–40)
Albumin/Globulin Ratio: 1.9 (ref 1.2–2.2)
Albumin: 4 g/dL (ref 3.8–4.8)
Alkaline Phosphatase: 58 IU/L (ref 44–121)
BUN/Creatinine Ratio: 15 (ref 10–24)
BUN: 18 mg/dL (ref 8–27)
Bilirubin Total: 0.3 mg/dL (ref 0.0–1.2)
CO2: 19 mmol/L — ABNORMAL LOW (ref 20–29)
Calcium: 9 mg/dL (ref 8.6–10.2)
Chloride: 104 mmol/L (ref 96–106)
Creatinine, Ser: 1.21 mg/dL (ref 0.76–1.27)
Globulin, Total: 2.1 g/dL (ref 1.5–4.5)
Glucose: 153 mg/dL — ABNORMAL HIGH (ref 70–99)
Potassium: 4.2 mmol/L (ref 3.5–5.2)
Sodium: 139 mmol/L (ref 134–144)
Total Protein: 6.1 g/dL (ref 6.0–8.5)
eGFR: 64 mL/min/{1.73_m2} (ref >59)

## 2022-01-20 ENCOUNTER — Other Ambulatory Visit: Payer: Self-pay | Admitting: Family Medicine

## 2022-01-20 DIAGNOSIS — E1140 Type 2 diabetes mellitus with diabetic neuropathy, unspecified: Secondary | ICD-10-CM

## 2022-01-21 NOTE — Telephone Encounter (Signed)
Requested Prescriptions  Pending Prescriptions Disp Refills  . JARDIANCE 25 MG TABS tablet [Pharmacy Med Name: Jardiance 25 MG Oral Tablet] 90 tablet 1    Sig: TAKE 1 TABLET BY MOUTH DAILY  BEFORE BREAKFAST     Endocrinology:  Diabetes - SGLT2 Inhibitors Failed - 01/20/2022  7:34 AM      Failed - HBA1C is between 0 and 7.9 and within 180 days    Hemoglobin A1C  Date Value Ref Range Status  03/30/2021 7.1 (A) 4.0 - 5.6 % Final  01/29/2020 7.3  Final    Comment:    [place]   Hgb A1c MFr Bld  Date Value Ref Range Status  11/19/2020 7.3 (H) 4.8 - 5.6 % Final    Comment:             Prediabetes: 5.7 - 6.4          Diabetes: >6.4          Glycemic control for adults with diabetes: <7.0          Failed - Valid encounter within last 6 months    Recent Outpatient Visits          8 months ago Acute cystitis with hematuria   [place], DO   9 months ago Type 2 diabetes, controlled, with neuropathy [facility])   [facility] Olin Hauser, DO   1 year ago Type 2 diabetes, controlled, with neuropathy [facility])   [facility] Olin Hauser, DO   1 year ago Type 2 diabetes, controlled, with neuropathy [facility])   [place], DO      Future Appointments            In 6 days [place], Devonne Doughty, DO [facility] [place] in normal range and within 360 days    Creatinine, Ser  Date Value Ref Range Status  01/01/2022 1.21 0.76 - 1.27 mg/dL Final         Passed - eGFR in normal range and within 360 days    GFR, Estimated  Date Value Ref Range Status  03/06/2021 >60 >60 mL/min Final    Comment:    (NOTE) Calculated using the CKD-EPI Creatinine Equation (2021)    eGFR  Date Value Ref Range Status  01/01/2022 64 >59 mL/min/1.73 Final

## 2022-01-25 ENCOUNTER — Ambulatory Visit (INDEPENDENT_AMBULATORY_CARE_PROVIDER_SITE_OTHER): Payer: Managed Care, Other (non HMO)

## 2022-01-25 DIAGNOSIS — I442 Atrioventricular block, complete: Secondary | ICD-10-CM

## 2022-01-27 ENCOUNTER — Ambulatory Visit (INDEPENDENT_AMBULATORY_CARE_PROVIDER_SITE_OTHER): Payer: Managed Care, Other (non HMO) | Admitting: Family Medicine

## 2022-01-27 ENCOUNTER — Encounter: Payer: Self-pay | Admitting: Family Medicine

## 2022-01-27 VITALS — BP 136/82 | HR 84 | Ht 70.5 in | Wt 206.0 lb

## 2022-01-27 DIAGNOSIS — N529 Male erectile dysfunction, unspecified: Secondary | ICD-10-CM | POA: Diagnosis not present

## 2022-01-27 DIAGNOSIS — E1140 Type 2 diabetes mellitus with diabetic neuropathy, unspecified: Secondary | ICD-10-CM | POA: Diagnosis not present

## 2022-01-27 DIAGNOSIS — Z23 Encounter for immunization: Secondary | ICD-10-CM

## 2022-01-27 DIAGNOSIS — Z1211 Encounter for screening for malignant neoplasm of colon: Secondary | ICD-10-CM | POA: Diagnosis not present

## 2022-01-27 LAB — CUP PACEART REMOTE DEVICE CHECK
Battery Remaining Longevity: 150 mo
Battery Remaining Percentage: 100 %
Brady Statistic RA Percent Paced: 0 %
Brady Statistic RV Percent Paced: 99 %
Date Time Interrogation Session: 20230911231300
Implantable Lead Implant Date: 20221209
Implantable Lead Implant Date: 20221209
Implantable Lead Location: 753859
Implantable Lead Location: 753860
Implantable Lead Model: 7841
Implantable Lead Model: 7842
Implantable Lead Serial Number: 1100565
Implantable Lead Serial Number: 1189145
Implantable Pulse Generator Implant Date: 20221209
Lead Channel Impedance Value: 606 Ohm
Lead Channel Impedance Value: 666 Ohm
Lead Channel Pacing Threshold Amplitude: 0.4 V
Lead Channel Pacing Threshold Amplitude: 0.8 V
Lead Channel Pacing Threshold Pulse Width: 0.4 ms
Lead Channel Pacing Threshold Pulse Width: 0.4 ms
Lead Channel Setting Pacing Amplitude: 1.4 V
Lead Channel Setting Pacing Amplitude: 2 V
Lead Channel Setting Pacing Pulse Width: 0.4 ms
Lead Channel Setting Sensing Sensitivity: 3 mV
Pulse Gen Serial Number: 549513

## 2022-01-27 MED ORDER — SILDENAFIL CITRATE 20 MG PO TABS: ORAL_TABLET | ORAL | 0 refills | Status: DC

## 2022-01-27 NOTE — Patient Instructions (Addendum)
Thank you for coming to the office today.  High dose Flu Shot today  Ordered Sildenafil 40m as discussed, do NOT take with Nitroglycerin tablets. If chest pain or pressure - please seek medical care as soon as possible if it is concerning and an urgent/emergency situation.  Please track down record from Pharmacy for your Pneumonia vaccine from last year 2022 I need to know the date and name of the shot  Also please send me the copy of the [facility] results A1c  --------------------------  Ordered the Cologuard (home kit) test for colon cancer screening. Stay tuned for further updates.  It will be shipped to you directly. If not received in 2-4 weeks, call [place] the company.   If you send it back and no results are received in 2-4 weeks, call [place] the company as well!   Colon Cancer Screening: - For all adults age 72+routine colon cancer screening is highly recommended.     - [place] guidelines [place] age [place] 433- Early detection of colon cancer is important, because often there are no warning signs or symptoms, also if found early usually it can be cured. Late stage is hard to treat.   - If Cologuard is NEGATIVE, then it is good for 3 years before next due - If Cologuard is POSITIVE, then it is strongly advised to get a Colonoscopy, which allows the GI doctor to locate the source of the cancer or polyp (even very early stage) and treat it by removing it. ------------------------- Follow instructions to collect sample, you may call the company for any help or questions, 24/7 telephone support at [phone]     Please schedule a Follow-up Appointment to: Return in about 6 months (around 07/28/2022) for 6 month follow-up DM A1c.  If you have any other questions or concerns, please feel free to call the office or send a message through [place] You may also schedule an earlier appointment if necessary.  Additionally, you may be receiving a  survey about your experience at our office within a few days to 1 week by e-mail or mail. We value your feedback.  ANobie Putnam DO [place]

## 2022-01-27 NOTE — Progress Notes (Signed)
Subjective:    Patient ID: Ronald Roth, male    DOB: [birth year], 71 y.o.   MRN: [id number]  Ronald Roth is a 71 y.o. male presenting on 01/27/2022 for Erectile Dysfunction   HPI  ED Chronic issue He is doing well with physical activity and sexual activity now he is no longer winded and he feels good overall. Much improved from previous   CAD s/p CABG Followed by [facility] [place] [facility] [name], has had CT and Nuclear stress tests Prior MI in 2013, angioplasty, stent. Established with [facility] proceeded with stress testing, Heart Cath and ultimately coordinated with CVTS / Cardiology for CABG. Reviewed records. His medications have been managed by Cardiology, he has had repeat ECHO with reduced LVEF 35%, awaiting repeat ECHO in 3 months now for improvement in function. Has followed by Cardiac Rehab. Zio patch in place Continues Lasix 20mg  with potassium 10 daily  DM updates [facility] A1c annual physical, request copy Improved  Request Copy of Diabetic eye exam  DM Neuropathy in feet   Health Maintenance:  He had Pneumonia Vaccine 1 year ago  Due for Flu Shot today     03/16/2021    2:36 PM 12/16/2020    3:18 PM 11/21/2020    9:14 AM  Depression screen PHQ 2/9  Decreased Interest 0 0 0  Down, Depressed, Hopeless 1 0 0  PHQ - 2 Score 1 0 0  Altered sleeping 0 0 0  Tired, decreased energy 1 0 1  Change in appetite 0 0 0  Feeling bad or failure about yourself  1 0 0  Trouble concentrating 0 0 0  Moving slowly or fidgety/restless 0 0 0  Suicidal thoughts 0 0 0  PHQ-9 Score 3 0 1  Difficult doing work/chores Not difficult at all Not difficult at all Not difficult at all    Social History   Tobacco Use   Smoking status: Former    Packs/day: 1.00    Years: 30.00    Total pack years: 30.00    Types: Cigarettes    Quit date: 04/18/1991    Years since quitting: 30.8   Smokeless tobacco: Former    Types: Chew    Quit date: 04/16/1993  Vaping Use   Vaping Use:  Never used  Substance Use Topics   Alcohol use: Yes    Comment: Occasionally    Drug use: Never    Review of Systems Per HPI unless specifically indicated above     Objective:    BP 136/82 (BP Location: Left Arm, Cuff Size: Normal)   Pulse 84   Ht 5' 10.5" (1.791 m)   Wt 206 lb (93.4 kg)   SpO2 97%   BMI 29.14 kg/m   Wt Readings from Last 3 Encounters:  01/27/22 206 lb (93.4 kg)  12/25/21 208 lb (94.3 kg)  07/15/21 205 lb (93 kg)    Physical Exam Vitals and nursing note reviewed.  Constitutional:      General: He is not in acute distress.    Appearance: He is well-developed. He is not diaphoretic.     Comments: Well-appearing, comfortable, cooperative  HENT:     Head: Normocephalic and atraumatic.  Eyes:     General:        Right eye: No discharge.        Left eye: No discharge.     Conjunctiva/sclera: Conjunctivae normal.  Neck:     Thyroid: No thyromegaly.  Cardiovascular:     Rate and Rhythm:  Normal rate and regular rhythm.     Pulses: Normal pulses.     Heart sounds: Normal heart sounds. No murmur heard. Pulmonary:     Effort: Pulmonary effort is normal. No respiratory distress.     Breath sounds: Normal breath sounds. No wheezing or rales.  Musculoskeletal:        General: Normal range of motion.     Cervical back: Normal range of motion and neck supple.  Lymphadenopathy:     Cervical: No cervical adenopathy.  Skin:    General: Skin is warm and dry.     Findings: No erythema or rash.  Neurological:     Mental Status: He is alert and oriented to person, place, and time. Mental status is at baseline.  Psychiatric:        Behavior: Behavior normal.     Comments: Well groomed, good eye contact, normal speech and thoughts       Results for orders placed or performed in visit on 01/25/22  CUP PACEART REMOTE DEVICE CHECK  Result Value Ref Range   Date Time Interrogation Session [phone]    Pulse Generator Manufacturer BOST    Pulse Gen Model  L331 ACCOLADE MRI EL    Pulse Gen Serial Number [id number]    Clinic Name [facility]    Implantable Pulse Generator Type Implantable Pulse Generator    Implantable Pulse Generator Implant Date MJ:[id number]    Implantable Lead Manufacturer BOST    Implantable Lead Model 7841 Ingevity + MRI    Implantable Lead Serial Number [id number]    Implantable Lead Implant Date MJ:2911773    Implantable Lead Location Detail 1 UNKNOWN    Implantable Lead Location [id number]    Implantable Lead Manufacturer BOST    Implantable Lead Model 7842 Ingevity + MRI    Implantable Lead Serial Number [id number]    Implantable Lead Implant Date MJ:2911773    Implantable Lead Location Detail 1 UNKNOWN    Implantable Lead Location [phone]    Lead Channel Setting Sensing Sensitivity 3.0 mV   Lead Channel Setting Sensing Adaptation Mode Fixed Pacing    Lead Channel Setting Pacing Amplitude 2.0 V   Lead Channel Setting Pacing Pulse Width 0.4 ms   Lead Channel Setting Pacing Amplitude 1.4 V   Lead Channel Impedance Value 666 ohm   Lead Channel Pacing Threshold Amplitude 0.4 V   Lead Channel Pacing Threshold Pulse Width 0.4 ms   Lead Channel Impedance Value 606 ohm   Lead Channel Pacing Threshold Amplitude 0.8 V   Lead Channel Pacing Threshold Pulse Width 0.4 ms   Battery Status BOS    Battery Remaining Longevity 150 mo   Battery Remaining Percentage 100 %   Brady Statistic RA Percent Paced 0 %   Brady Statistic RV Percent Paced 99 %      Assessment & Plan:   Problem List Items Addressed This Visit     Type 2 diabetes, controlled, with neuropathy (HCC)   Relevant Orders   Urine Microalbumin w/creat. ratio   Other Visit Diagnoses     Erectile dysfunction, unspecified erectile dysfunction type    -  Primary   Relevant Medications   sildenafil (REVATIO) 20 MG tablet   Needs flu shot       Relevant Orders   Flu Vaccine QUAD High Dose(Fluad) (Completed)   Screening for colon cancer       Relevant Orders   Cologuard        Orders Placed This Encounter  Procedures   Flu Vaccine QUAD High Dose(Fluad)   Cologuard   Urine Microalbumin w/creat. ratio   Flu Shot today  ED Reviewed his CAD history in detail. He does not have any angina and does not have NTG currently. He is aware not to take both. Re order Sildenafil use PRN, goodrx  Request copy of last [facility] Biometric results including A1c  Request copy of last Pneumonia Vaccine from pharmacy 2022  Due for routine colon cancer screening. Never had colonoscopy (not interested), no family history colon cancer. - Discussion today about recommendations for either Colonoscopy or Cologuard screening, benefits and risks of screening, interested in Cologuard, understands that if positive then recommendation is for diagnostic colonoscopy to follow-up. - Ordered Cologuard today   Meds ordered this encounter  Medications   sildenafil (REVATIO) 20 MG tablet    Sig: Take 1-5 pills about 30 min prior to sex. Start with 1 and increase as needed.    Dispense:  30 tablet    Refill:  0      Follow up plan: Return in about 6 months (around 07/28/2022) for 6 month follow-up DM A1c.  Saralyn Pilar, DO [facility] 01/27/2022, 11:01 AM

## 2022-02-02 ENCOUNTER — Encounter: Payer: Self-pay | Admitting: Family Medicine

## 2022-02-02 MED ORDER — OZEMPIC (0.25 OR 0.5 MG/DOSE) 2 MG/3ML ~~LOC~~ SOPN: 0.5000 mg | PEN_INJECTOR | SUBCUTANEOUS | 0 refills | Status: DC

## 2022-02-03 ENCOUNTER — Other Ambulatory Visit: Payer: Self-pay | Admitting: Family Medicine

## 2022-02-03 DIAGNOSIS — N529 Male erectile dysfunction, unspecified: Secondary | ICD-10-CM

## 2022-02-03 NOTE — Telephone Encounter (Signed)
Requested Prescriptions  Pending Prescriptions Disp Refills  . sildenafil (REVATIO) 20 MG tablet [Pharmacy Med Name: SILDENAFIL 20MG  TABLETS] 30 tablet 0    Sig: TAKE 1 TO 5 TABLETS BY MOUTH ABOUT 30 MINS PRIOR TO SEX. START WITH 1 AND INCREASE AS NEEDED     Urology: Erectile Dysfunction Agents Passed - 02/03/2022  3:40 AM      Passed - AST in normal range and within 360 days    AST  Date Value Ref Range Status  01/01/2022 13 0 - 40 IU/L Final         Passed - ALT in normal range and within 360 days    ALT  Date Value Ref Range Status  01/01/2022 15 0 - 44 IU/L Final         Passed - Last BP in normal range    BP Readings from Last 1 Encounters:  01/27/22 136/82         Passed - Valid encounter within last 12 months    Recent Outpatient Visits          1 week ago Erectile dysfunction, unspecified erectile dysfunction type   [place], DO   9 months ago Acute cystitis with hematuria   [place], DO   10 months ago Type 2 diabetes, controlled, with neuropathy [facility])   [facility] Olin Hauser, DO   1 year ago Type 2 diabetes, controlled, with neuropathy [facility])   [facility] Olin Hauser, DO   1 year ago Type 2 diabetes, controlled, with neuropathy [facility])   [place], Devonne Doughty, DO

## 2022-02-07 ENCOUNTER — Other Ambulatory Visit: Payer: Self-pay | Admitting: Family Medicine

## 2022-02-07 DIAGNOSIS — N529 Male erectile dysfunction, unspecified: Secondary | ICD-10-CM

## 2022-02-08 NOTE — Telephone Encounter (Signed)
Rx 02/03/22 #30- too soon Requested Prescriptions  Pending Prescriptions Disp Refills  . sildenafil (REVATIO) 20 MG tablet [Pharmacy Med Name: SILDENAFIL 20MG  TABLETS] 30 tablet 0    Sig: TAKE 1 TO 5 TABLETS BY MOUTH 30 MINUTES BEFORE SEXUAL ACTIVITY. START WITH 1 AND INCREASE AS NEEDED     Urology: Erectile Dysfunction Agents Passed - 02/07/2022  8:39 AM      Passed - AST in normal range and within 360 days    AST  Date Value Ref Range Status  01/01/2022 13 0 - 40 IU/L Final         Passed - ALT in normal range and within 360 days    ALT  Date Value Ref Range Status  01/01/2022 15 0 - 44 IU/L Final         Passed - Last BP in normal range    BP Readings from Last 1 Encounters:  01/27/22 136/82         Passed - Valid encounter within last 12 months    Recent Outpatient Visits          1 week ago Erectile dysfunction, unspecified erectile dysfunction type   [place], DO   9 months ago Acute cystitis with hematuria   [place], DO   10 months ago Type 2 diabetes, controlled, with neuropathy [facility])   [facility] Olin Hauser, DO   1 year ago Type 2 diabetes, controlled, with neuropathy [facility])   [facility] Olin Hauser, DO   1 year ago Type 2 diabetes, controlled, with neuropathy [facility])   [place], Devonne Doughty, DO

## 2022-02-11 NOTE — Progress Notes (Signed)
Remote pacemaker transmission.   

## 2022-02-26 ENCOUNTER — Other Ambulatory Visit: Payer: Self-pay | Admitting: Family Medicine

## 2022-02-27 LAB — MICROALBUMIN / CREATININE URINE RATIO
Creatinine, Urine: 99.2 mg/dL
Microalb/Creat Ratio: 31 mg/g creat — ABNORMAL HIGH (ref 0–29)
Microalbumin, Urine: 30.4 ug/mL

## 2022-03-02 LAB — COLOGUARD: COLOGUARD: NEGATIVE

## 2022-03-24 ENCOUNTER — Other Ambulatory Visit: Payer: Self-pay | Admitting: Internal Medicine

## 2022-03-24 DIAGNOSIS — E1140 Type 2 diabetes mellitus with diabetic neuropathy, unspecified: Secondary | ICD-10-CM

## 2022-03-24 NOTE — Telephone Encounter (Signed)
Requested medication (s) are due for refill today:   Yes  Requested medication (s) are on the active medication list:   Yes  Future visit scheduled:   No   Last ordered: 02/02/2022 9 ml, 0 refills  Returned because labs are due per protocol.     Requested Prescriptions  Pending Prescriptions Disp Refills   OZEMPIC, 0.25 OR 0.5 MG/DOSE, 2 MG/3ML SOPN [Pharmacy Med Name: OZEMPIC  0.25MG  0.5MG  SOLUTION PEN-INJECTOR  PEN DOSE] 9 mL 3    Sig: INJECT 0.25 MG SUBCUTANEOUSLY  WEEKLY FOR 4 WEEKS, THEN 0.5 MG  WEEKLY THEREAFTER     Endocrinology:  Diabetes - GLP-1 Receptor Agonists - semaglutide Failed - 03/24/2022  7:03 AM      Failed - HBA1C in normal range and within 180 days    Hemoglobin A1C  Date Value Ref Range Status  03/30/2021 7.1 (A) 4.0 - 5.6 % Final  01/29/2020 7.3  Final    Comment[facility]   Hgb A1c MFr Bld  Date Value Ref Range Status  11/19/2020 7.3 (H) 4.8 - 5.6 % Final    Comment:             Prediabetes: 5.7 - 6.4          Diabetes: >6.4          Glycemic control for adults with diabetes: <7.0          Passed - Cr in normal range and within 360 days    Creatinine, Ser  Date Value Ref Range Status  01/01/2022 1.21 0.76 - 1.27 mg/dL Final         Passed - Valid encounter within last 6 months    Recent Outpatient Visits           1 month ago Erectile dysfunction, unspecified erectile dysfunction type   [facility] Smitty [place]ords, DO   11 months ago Acute cystitis with hematuria   [facility] Smitty Cords, DO   11 months ago Type 2 diabetes, controlled, with neuropathy [facility])   [facility] Smitty Cords, DO   1 year ago Type 2 diabetes, controlled, with neuropathy [facility])   [facility] Smitty Cords, DO   1 year ago Type 2 diabetes, controlled, with neuropathy [facility])   [facility] [place], Netta Neat, DO

## 2022-04-26 ENCOUNTER — Ambulatory Visit (INDEPENDENT_AMBULATORY_CARE_PROVIDER_SITE_OTHER): Payer: Managed Care, Other (non HMO)

## 2022-04-26 DIAGNOSIS — I442 Atrioventricular block, complete: Secondary | ICD-10-CM | POA: Diagnosis not present

## 2022-04-27 LAB — CUP PACEART REMOTE DEVICE CHECK
Battery Remaining Longevity: 150 mo
Battery Remaining Percentage: 100 %
Brady Statistic RA Percent Paced: 0 %
Brady Statistic RV Percent Paced: 99 %
Date Time Interrogation Session: 20231211041100
Implantable Lead Connection Status: 753985
Implantable Lead Connection Status: 753985
Implantable Lead Implant Date: 20221209
Implantable Lead Implant Date: 20221209
Implantable Lead Location: 753859
Implantable Lead Location: 753860
Implantable Lead Model: 7841
Implantable Lead Model: 7842
Implantable Lead Serial Number: 1100565
Implantable Lead Serial Number: 1189145
Implantable Pulse Generator Implant Date: 20221209
Lead Channel Impedance Value: 600 Ohm
Lead Channel Impedance Value: 705 Ohm
Lead Channel Pacing Threshold Amplitude: 0.5 V
Lead Channel Pacing Threshold Amplitude: 0.7 V
Lead Channel Pacing Threshold Pulse Width: 0.4 ms
Lead Channel Pacing Threshold Pulse Width: 0.4 ms
Lead Channel Setting Pacing Amplitude: 1.2 V
Lead Channel Setting Pacing Amplitude: 2 V
Lead Channel Setting Pacing Pulse Width: 0.4 ms
Lead Channel Setting Sensing Sensitivity: 3 mV
Pulse Gen Serial Number: 549513
Zone Setting Status: 755011

## 2022-04-29 ENCOUNTER — Other Ambulatory Visit: Payer: Self-pay | Admitting: Family Medicine

## 2022-04-29 DIAGNOSIS — E785 Hyperlipidemia, unspecified: Secondary | ICD-10-CM

## 2022-04-29 DIAGNOSIS — E1169 Type 2 diabetes mellitus with other specified complication: Secondary | ICD-10-CM

## 2022-04-29 NOTE — Telephone Encounter (Signed)
Requested medication (s) are due for refill today:   Yes for both  Requested medication (s) are on the active medication list:   Yes for both  Future visit scheduled:   No     Seen 3 months ago and note is to be seen again in 6 months around 07/28/2022.   Last ordered: Both on 06/29/2021 #90, 3 refills  Returned because labs due per protocol.    Mail order pharmacy requesting a 1 yr supply.     Requested Prescriptions  Pending Prescriptions Disp Refills   atorvastatin (LIPITOR) 80 MG tablet [Pharmacy Med Name: Atorvastatin Calcium 80 MG Oral Tablet] 90 tablet 3    Sig: TAKE 1 TABLET BY MOUTH IN THE  MORNING     Cardiovascular:  Antilipid - Statins Failed - 04/29/2022  2:43 AM      Failed - Lipid Panel in normal range within the last 12 months    Cholesterol, Total  Date Value Ref Range Status  01/01/2022 122 100 - 199 mg/dL Final   LDL Chol Calc (NIH)  Date Value Ref Range Status  01/01/2022 72 0 - 99 mg/dL Final   HDL  Date Value Ref Range Status  01/01/2022 26 (L) >39 mg/dL Final   Triglycerides  Date Value Ref Range Status  01/01/2022 133 0 - 149 mg/dL Final         Passed - Patient is not pregnant      Passed - Valid encounter within last 12 months    Recent Outpatient Visits           3 months ago Erectile dysfunction, unspecified erectile dysfunction type   [facility] Olin Hauser, DO   1 year ago Acute cystitis with hematuria   [place], DO   1 year ago Type 2 diabetes, controlled, with neuropathy [facility])   [facility] Olin [facility]er, DO   1 year ago Type 2 diabetes, controlled, with neuropathy [facility])   [facility] Olin Hauser, DO   1 year ago Type 2 diabetes, controlled, with neuropathy ([place])   [facility], Devonne Doughty, DO               fenofibrate (TRICOR) 145 MG tablet [Pharmacy Med Name:  Fenofibrate 145 MG Oral Tablet] 90 tablet 3    Sig: TAKE 1 TABLET BY MOUTH IN THE  MORNING     Cardiovascular:  Antilipid - Fibric Acid Derivatives Failed - 04/29/2022  2:43 AM      Failed - HGB in normal range and within 360 days    Hemoglobin  Date Value Ref Range Status  04/15/2021 15.0 13.0 - 17.7 g/dL Final         Failed - HCT in normal range and within 360 days    Hematocrit  Date Value Ref Range Status  04/15/2021 44.2 37.5 - 51.0 % Final         Failed - PLT in normal range and within 360 days    Platelets  Date Value Ref Range Status  04/15/2021 240 150 - 450 x10E3/uL Final         Failed - WBC in normal range and within 360 days    WBC  Date Value Ref Range Status  04/15/2021 7.9 3.4 - 10.8 x10E3/uL Final  03/06/2021 10.0 4.0 - 10.5 K/uL Final         Failed - Lipid Panel in  normal range within the last 12 months    Cholesterol, Total  Date Value Ref Range Status  01/01/2022 122 100 - 199 mg/dL Final   LDL Chol Calc (NIH)  Date Value Ref Range Status  01/01/2022 72 0 - 99 mg/dL Final   HDL  Date Value Ref Range Status  01/01/2022 26 (L) >39 mg/dL Final   Triglycerides  Date Value Ref Range Status  01/01/2022 133 0 - 149 mg/dL Final         Passed - ALT in normal range and within 360 days    ALT  Date Value Ref Range Status  01/01/2022 15 0 - 44 IU/L Final         Passed - AST in normal range and within 360 days    AST  Date Value Ref Range Status  01/01/2022 13 0 - 40 IU/L Final         Passed - Cr in normal range and within 360 days    Creatinine, Ser  Date Value Ref Range Status  01/01/2022 1.21 0.76 - 1.27 mg/dL Final         Passed - eGFR is 30 or above and within 360 days    GFR, Estimated  Date Value Ref Range Status  03/06/2021 >60 >60 mL/min Final    Comment:    (NOTE) Calculated using the CKD-EPI Creatinine Equation (2021)    eGFR  Date Value Ref Range Status  01/01/2022 64 >59 mL/min/1.73 Final         Passed -  Valid encounter within last 12 months    Recent Outpatient Visits           3 months ago Erectile dysfunction, unspecified erectile dysfunction type   [facility] Olin Hauser, DO   1 year ago Acute cystitis with hematuria   [place], DO   1 year ago Type 2 diabetes, controlled, with neuropathy [facility])   [facility] Olin Hauser, DO   1 year ago Type 2 diabetes, controlled, with neuropathy [facility])   [facility] Olin Hauser, DO   1 year ago Type 2 diabetes, controlled, with neuropathy [facility])   [place], Devonne Doughty, DO

## 2022-06-03 NOTE — Progress Notes (Signed)
Remote pacemaker transmission.   

## 2022-06-25 NOTE — Progress Notes (Deleted)
Cardiology Office Note    Date:  06/25/2022   ID:  Neizan Heino, DOB Oct 13, 1950, MRN [id number]  PCP:  Olin Hauser, DO  Cardiologist:  [name], MD  Electrophysiologist:  Vickie Epley, MD   Chief Complaint: Follow up  History of Present Illness:   Taivion Alvelo is a 72 y.o. 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 LEFT INTERNAL MAMMARY ARTERY AND RIGHT GREATER SAPHENOUS VEIN. LIMA TO LAD, SVG TO DIAG., SVG TO PDA  AND PLB;  Surgeon: Wonda Olds, MD;  Location: [facility];  Service: Open Heart Surgery;  Laterality: N/A;   ENDOVEIN HARVEST OF GREATER SAPHENOUS VEIN Right 10/31/2020   Procedure: ENDOVEIN HARVEST OF GREATER SAPHENOUS VEIN;  Surgeon: Wonda Olds, MD;  Location: [facility];  Service: Open Heart Surgery;  Laterality: Right;   LEFT HEART CATH AND CORONARY ANGIOGRAPHY N/A 10/20/2020   Procedure: LEFT HEART CATH AND CORONARY ANGIOGRAPHY;  Surgeon: Nelva Bush, MD;  Location: [facility];  Service: Cardiovascular;  Laterality: N/A;   PACEMAKER IMPLANT N/A 04/24/2021   Procedure: PACEMAKER IMPLANT;  Surgeon: Vickie Epley, MD;  Location: [facility];  Service: Cardiovascular;  Laterality: N/A;   Ruptured disc      Sciatica    STENT PLACEMENT VASCULAR ([facility])  2012   TEE WITHOUT CARDIOVERSION N/A 10/31/2020   Procedure: TRANSESOPHAGEAL ECHOCARDIOGRAM (TEE);  Surgeon: Wonda Olds, MD;  Location: [facility];  Service: Open Heart Surgery;  Laterality: N/A;    Current Medications: No outpatient medications have been marked as taking for the 06/28/22 encounter (Appointment) with Rise Mu, PA-C.    Allergies:   Beta adrenergic blockers   Social History   Socioeconomic History   Marital status: Married    Spouse name: Not on file   Number of children: Not on file   Years of education: Not on file   Highest education level: Not on file  Occupational History   Not on file  Tobacco Use   Smoking status: Former    Packs/day: 1.00    Years: 30.00    Total pack years: 30.00    Types: Cigarettes    Quit date: 04/18/1991    Years since quitting: 31.2   Smokeless tobacco: Former    Types: Chew    Quit date: 04/16/1993  Vaping Use   Vaping Use: Never used  Substance and Sexual Activity   Alcohol use: Yes    Comment: Occasionally    Drug use: Never   Sexual activity: Not on file  Other Topics Concern   Not on file  Social History Narrative   Not on file   Social  Determinants of Health   Financial Resource Strain: Not on file  Food Insecurity: Not on file  Transportation Needs: Not on file  Physical Activity: Not on file  Stress: Not on file  Social Connections: Not on file     Family History:  The patient's family history includes Aortic aneurysm in his father; Arrhythmia in his mother; Heart disease in his brother; Hyperlipidemia in his sister; Hypertension in his sister; Pneumonia in his mother.  ROS:   12 point review of systems is negative unless otherwise noted in HPI.   EKGs/Labs/Other Studies Reviewed:    Studies reviewed were summarized above. The additional studies were reviewed today:  Lexiscan MPI 09/25/2020: Pharmacological myocardial perfusion imaging study with  no significant ischemia Large perfusion defect in the mid to distal anteroseptal wall, mid to distal inferior wall and apical region Hypokinesis of the region detailed above,, EF estimated at 32% No EKG changes concerning for ischemia at peak stress or in recovery. CT attenuation correction images with moderate aortic atherosclerosis, three-vessel coronary calcification Moderate risk scan given depressed ejection fraction, large defect __________   2D echo 10/08/2020: 1. Left ventricular ejection fraction, by estimation, is 25 to 30%. The  left ventricle has severely decreased function. The left ventricle  demonstrates global hypokinesis. There is mild left ventricular  hypertrophy. Left ventricular diastolic parameters   are indeterminate.   2. Right ventricular systolic function is mildly reduced. The right  ventricular size is normal.   3. The mitral valve is normal in structure. Mild mitral valve  regurgitation.   4. The aortic valve was not well visualized. Aortic valve regurgitation  is not visualized.  __________   LHC 10/20/2020: Conclusions: Severe three-vessel coronary artery disease, including long segment of diffuse mid LAD disease of 50-90% stenosis with moderate to severe associated calcification and occlusion of branch of D1, small LCx with chronic total occlusion of OM1, and large RCA with 70-90% tandem lesions involving rPDA and 60% rPL stenosis. Patient rPL stent with mild in-stent restenosis (~20%). Upper normal left ventricular filling pressure (LVEDP ~15 mmHg).   Recommendations: Outpatient cardiac surgery consultation for CABG, given three-vessel coronary artery disease, reduced LVEF, and diabetes mellitus. Continue current heart failure and antianginal regimen. Aggressive secondary prevention. __________   Pre-CABG vascular studies 10/29/2020: Summary:  Right Carotid: Velocities in the right ICA are consistent with a 40-59%                 stenosis.   Left  Carotid: Velocities in the left ICA are consistent with a 1-39%  stenosis.  Vertebrals:  Bilateral vertebral arteries demonstrate antegrade flow. Left               vertebral artery demonstrates high resistant flow.  Subclavians: Normal flow hemodynamics were seen in bilateral subclavian               arteries.   Right ABI: Resting right ankle-brachial index is within normal range. No  evidence of significant right lower extremity arterial disease.  Left ABI: Resting left ankle-brachial index is within normal range. No  evidence of significant left lower extremity arterial disease.  Right Upper Extremity: Doppler waveforms remain within normal limits with  right radial compression. Doppler waveform obliterate with right ulnar  compression.  Left Upper Extremity: Doppler waveform obliterate with left radial  compression. Doppler waveforms remain within normal limits with left ulnar  compression.  __________   Intraoperative TEE 10/31/2020: POST-OP IMPRESSIONS  limited Post-CPB examination: The patient separated  easily from CPB.  - Left Ventricle: The left ventricle shows improvement in global  contractility.  There is some recovery of contractility in the inferior wall, although it  remains hypokinetic. The septal area remains akinetic. Visually, the EF  has  imroved to approximately 30%.  - Right Ventricle: The right ventricular function appears normal,  unchanged from  pre-bypass images.  - Aortic Valve: The aortic valve function appears normal and unchanged  from  pre-bypass images.  - Mitral Valve: The mitral valve function appears normal, and unchanged  from  pre-bypass images.  - Tricuspid Valve: The tricuspid valve function appears unchanged from  pre-bypass images.  __________   Elwyn Reach patch 11/2020: Patient had a min HR of 75 bpm, max HR of 193 bpm, and avg HR of 96 bpm.  Predominant underlying rhythm was Sinus Rhythm.  First Degree AV Block was present.  Bundle Branch  Block/IVCD was present.    3 Supraventricular Tachycardia runs occurred, the run with the fastest interval lasting 13 beats with a max rate of 193 bpm, the longest lasting 19.3 secs with an avg rate of 117 bpm.    Isolated SVEs were rare (<1.0%), SVE Couplets were rare (<1.0%), and SVE Triplets were rare (<1.0%).  Isolated VEs were occasional  (1.6%, 30479), VE Couplets were rare (<1.0%, 3115), and VE Triplets were rare (<1.0%, 1).   One patient triggered event, not associated with significant arrhytmia __________   Carlton Adam MPI 03/13/2021:   Abnormal pharmacologic myocardial perfusion stress test.   There is a moderate in size, severe, fixed inferior wall and apical lateral defect consistent with scar.   No significant ischemia is identified.   Left ventricular systolic function is mildly reduced (LVEF 45-50%) with inferior hypokinesis.   Attenuation correction CT demonstrates coronary artery calcification and aortic atherosclerosis as well as post CABG findings.   This is an intermediate risk study. __________   Elwyn Reach patch 02/2021: Normal sinus rhythm Patient had a min HR of 21 bpm, max HR of 85 bpm, and avg HR of 48 bpm.   Bundle Branch Block/IVCD was present.   62 Pauses occurred, the longest lasting 11.4 secs (5 bpm).  Some Pauses occurred due to Complete Heart Block and High Grade AV Block.    26376 episode(s) of AV Block (High Grade, 2nd Mobitz II and 3rd) occurred, lasting a total of 5 days 12 hours.    No Isolated SVEs, SVE Couplets, or SVE Triplets were present. Isolated VEs were rare (<1.0%), and no VE Couplets or VE Triplets were present.    Metoprolol succinate 50 mg daily held on March 24, 2021 when seen in clinic Will request telemetry strip on pause dated November 10 Suggest repeat ZIO live monitor for evaluation of arrhythmia off metoprolol __________   2D echo 04/17/2021: 1. Left ventricular ejection fraction, by estimation, is 45 to 50%. The  left  ventricle has mildly decreased function. The left ventricle  demonstrates regional wall motion abnormalities (septal wall hypokinesis  consistent with conduction  abnormality/bundle branch block). Left ventricular diastolic parameters  are indeterminate.   2. Right ventricular systolic function is normal. The right ventricular  size is normal. There is mildly elevated pulmonary artery systolic  pressure. The estimated right ventricular systolic pressure is [id number] mmHg.   3. Left atrial size was mildly dilated.   4. The mitral valve is normal in structure. Mild mitral valve  regurgitation. No evidence of mitral stenosis.   5. The aortic valve is normal in structure.  Aortic valve regurgitation is  not visualized. Aortic valve sclerosis is present, with no evidence of  aortic valve stenosis.   6. The inferior vena cava is normal in size with greater than 50%  respiratory variability, suggesting right atrial pressure of 3 mmHg.   7. Marked bradycardia, rate 34 bpm   EKG:  EKG is ordered today.  The EKG ordered today demonstrates ***  Recent Labs: 01/01/2022: ALT 15; BUN 18; Creatinine, Ser 1.21; Potassium 4.2; Sodium 139  Recent Lipid Panel    Component Value Date/Time   CHOL 122 01/01/2022 0715   TRIG 133 01/01/2022 0715   HDL 26 (L) 01/01/2022 0715   CHOLHDL 4.7 01/01/2022 0715   LDLCALC 72 01/01/2022 0715    PHYSICAL EXAM:    VS:  There were no vitals taken for this visit.  BMI: There is no height or weight on file to calculate BMI.  Physical Exam  Wt Readings from Last 3 Encounters:  01/27/22 206 lb (93.4 kg)  12/25/21 208 lb (94.3 kg)  07/15/21 205 lb (93 kg)     ASSESSMENT & PLAN:   CAD status post CABG without angina:   HFrEF secondary to ICM:   Complete heart block: Status post PPM in 04/2021.  HTN: Blood pressure ***  HLD: LDL ***  Carotid artery stenosis: Pre-CABG ultrasound showed 40 to 59% RICA stenosis with no left ICA stenosis.   {Are you ordering a  CV Procedure (e.g. stress test, cath, DCCV, TEE, etc)?   Press F2        :[id number]     Disposition: F/u with Dr. Rockey Situ or an APP in ***.   Medication Adjustments/Labs and Tests Ordered: Current medicines are reviewed at length with the patient today.  Concerns regarding medicines are outlined above. Medication changes, Labs and Tests ordered today are summarized above and listed in the Patient Instructions accessible in Encounters.   Signed, Christell Faith, PA-C 06/25/2022 12:31 PM     [address] [place], [place] [address] [phone]

## 2022-06-26 ENCOUNTER — Other Ambulatory Visit: Payer: Self-pay | Admitting: Family Medicine

## 2022-06-26 DIAGNOSIS — E1140 Type 2 diabetes mellitus with diabetic neuropathy, unspecified: Secondary | ICD-10-CM

## 2022-06-28 ENCOUNTER — Ambulatory Visit: Payer: Managed Care, Other (non HMO) | Admitting: Physician Assistant

## 2022-06-28 NOTE — Telephone Encounter (Signed)
Requested medication (s) are due for refill today: not quite  Requested medication (s) are on the active medication list: yes  Last refill:  01/21/22 #90/1  Future visit scheduled: no  Notes to clinic:  Unable to refill per protocol due to failed labs, no updated results.      Requested Prescriptions  Pending Prescriptions Disp Refills   JARDIANCE 25 MG TABS tablet [Pharmacy Med Name: Jardiance 25 MG Oral Tablet] 90 tablet 3    Sig: TAKE 1 TABLET BY MOUTH DAILY  BEFORE BREAKFAST     Endocrinology:  Diabetes - SGLT2 Inhibitors Failed - 06/26/2022 11:12 PM      Failed - HBA1C is between 0 and 7.9 and within 180 days    Hemoglobin A1C  Date Value Ref Range Status  03/30/2021 7.1 (A) 4.0 - 5.6 % Final  01/29/2020 7.3  Final    Comment:    [place]   Hgb A1c MFr Bld  Date Value Ref Range Status  11/19/2020 7.3 (H) 4.8 - 5.6 % Final    Comment:             Prediabetes: 5.7 - 6.4          Diabetes: >6.4          Glycemic control for adults with diabetes: <7.0          Passed - Cr in normal range and within 360 days    Creatinine, Ser  Date Value Ref Range Status  01/01/2022 1.21 0.76 - 1.27 mg/dL Final         Passed - eGFR in normal range and within 360 days    GFR, Estimated  Date Value Ref Range Status  03/06/2021 >60 >60 mL/min Final    Comment:    (NOTE) Calculated using the CKD-EPI Creatinine Equation (2021)    eGFR  Date Value Ref Range Status  01/01/2022 64 >59 mL/min/1.73 Final         Passed - Valid encounter within last 6 months    Recent Outpatient Visits           5 months ago Erectile dysfunction, unspecified erectile dysfunction type   [place], Alexander J, DO   1 year ago Acute cystitis with hematuria   [facility] Olin Hauser, DO   1 year ago Type 2 diabetes, controlled, with neuropathy [facility])   [facility]  Olin Hauser, DO   1 year ago Type 2 diabetes, controlled, with neuropathy [facility])   [facility] Olin Hauser, DO   1 year ago Type 2 diabetes, controlled, with neuropathy [facility])   [place], Alexander J, DO       Future Appointments             In 1 week Dunn, Areta Haber, PA-C [place] [facility]

## 2022-07-02 NOTE — Progress Notes (Signed)
Cardiology Office Note    Date:  07/06/2022   ID:  Ronald Roth, DOB [birth year], MRN [id number]  PCP:  Smitty Cords, DO  Cardiologist:  Julien Nordmann, MD  Electrophysiologist:  Lanier Prude, MD   Chief Complaint: Follow up  History of Present Illness:   Ronald Roth is a 72 y.o. male with history of CAD with MI in 2012 status post PCI status post four-vessel CABG in 10/2020 with LIMA to LAD, SVG to diagonal, SVG to rPDA and rPLB, HFrEF secondary to ICM, complete heart block status post PPM in 04/2021, carotid artery stenosis, DM2, HTN, HLD, strong family history of CAD, and prior tobacco use who presents for follow-up of his CAD and cardiomyopathy.   He was seen as a new patient on 09/16/2020 to establish care given his history of CAD.  At that time, he noted exertional fatigue, shortness of breath, and angina.  In this setting, he underwent Lexiscan MPI on 09/25/2020 which showed no significant ischemia with a large perfusion defect in the mid to distal anteroseptal wall, mid to distal inferior wall, and apical region with associated hypokinesis, EF 32%.  CT attenuation corrected images showed moderate aortic atherosclerosis and three-vessel coronary artery calcification.  Overall, this was a moderate risk scan given depressed EF and a large defect.  Subsequent echo on 10/08/2020 showed an EF of 25 to 30%, global hypokinesis, mild LVH, mildly reduced RV systolic function with normal RV cavity size, and mild mitral regurgitation.  Diagnostic LHC on 10/20/2020 showed severe three-vessel CAD including a long segment of diffuse mid LAD disease of 50 to 90% stenosis with moderate to severe associated calcification and occlusion of branch of D1, small LCx with CTO of OM1, and a large RCA with 70 to 90% tandem lesions involving the rPDA and 60% rPL stenosis.  The previously placed RPL stent was patent with mild in-stent restenosis of approximately 20%.  Upper normal LV filling pressure at  approximately 15 mmHg.  Given severe multivessel disease he was referred to CVTS for consideration of CABG.  He was evaluated by CVTS on 10/27/2020 with recommendation to proceed with CABG.  Preoperative carotid duplex showed 40 to 59% RICA stenosis with no significant LICA stenosis.  He underwent four-vessel CABG on 10/31/2020.  Postoperative course was notable for brief episode of high-grade AV block, volume overload with symptomatic improvement with diuresis, AKI, and expected ABL anemia.  Subsequent outpatient cardiac monitoring in 11/2020 showed a predominant rhythm of sinus with an average rate of 96 bpm and a range of 75 to 193 bpm, first-degree AV block, bundle branch block, 3 episodes of SVT with the longest lasting 19.3 seconds.  He underwent Lexiscan MPI in 02/2021 which showed a moderate in size, severe, fixed inferior wall and apical lateral defect consistent with scar with no significant ischemia.  LVEF 45 to 50%.  Overall, this was an intermediate risk study.  Repeat outpatient cardiac monitoring in 02/2021 showed sinus rhythm with an average rate of 48 bpm with a range of 21 to 85 bpm, bundle branch block, and a 62 pauses with the longest lasting 11.4 seconds.  Echo in 04/2021 demonstrated an EF of 45 to 50%, septal wall hypokinesis consistent with bundle branch block, normal RV systolic function and ventricular cavity size, PASP 36.8 mmHg, mildly dilated left atrium, mild mitral regurgitation, aortic valve sclerosis without evidence of stenosis.  Heart rate during the study was 34 bpm.  He was evaluated by EP and underwent PPM implantation  in 04/2021.  He was last seen in the office in 12/2021 and was without symptoms of angina or cardiac decompensation.  He remained very active at baseline.  Entresto was titrated to 97/103 mg twice daily with continuation of Jardiance and as needed furosemide.  Primary cardiologist has deferred addition of beta-blocker to EP.  He comes in today doing very well from a  cardiac perspective, without symptoms of angina or cardiac decompensation.  No dyspnea, palpitations, dizziness, presyncope, or syncope.  No lower extremity swelling, abdominal distention, orthopnea, PND, or early satiety.  His weight is stable when compared to his last clinic visit in 12/2021.  He is adherent and tolerating all cardiac medications.  Home blood pressure readings have been stable.  He does inform me that he will be moving to [place], [place] in 2 weeks to help out with one of his daughters.  He will be establishing medical care there upon arrival.  Otherwise, he does not have any concerns at this time.   Labs independently reviewed: 12/2021 - TC 122, TG 133, HDL 26, LDL 72, BUN 18, serum creatinine 1.21, potassium 4.2, albumin 4.0, AST/ALT normal 03/2021 - Hgb 15.0, PLT 240, A1c 7.1 11/2020 - TSH normal   Past Medical History:  Diagnosis Date   Anxiety    Coronary artery disease    Diabetes mellitus without complication (HCC)    GERD (gastroesophageal reflux disease)    Heart attack (HCC) 2012   Hypertension    S/P dual chamber pacemaker 04/25/2021   Sciatica     Past Surgical History:  Procedure Laterality Date   CARDIAC CATHETERIZATION     CORONARY ANGIOPLASTY WITH STENT PLACEMENT  2012   x 1   CORONARY ARTERY BYPASS GRAFT N/A 10/31/2020   Procedure: CORONARY ARTERY BYPASS GRAFTING (CABG)x 4 ON CARDIOPULMONARY BYPASS. LEFT INTERNAL MAMMARY ARTERY AND RIGHT GREATER SAPHENOUS VEIN. LIMA TO LAD, SVG TO DIAG., SVG TO PDA  AND PLB;  Surgeon: Linden Dolin, MD;  Location: [facility] OR;  Service: Open Heart Surgery;  Laterality: N/A;   ENDOVEIN HARVEST OF GREATER SAPHENOUS VEIN Right 10/31/2020   Procedure: ENDOVEIN HARVEST OF GREATER SAPHENOUS VEIN;  Surgeon: Linden Dolin, MD;  Location: [facility] OR;  Service: Open Heart Surgery;  Laterality: Right;   LEFT HEART CATH AND CORONARY ANGIOGRAPHY N/A 10/20/2020   Procedure: LEFT HEART CATH AND CORONARY ANGIOGRAPHY;  Surgeon: Yvonne Kendall, MD;  Location: [facility] INVASIVE CV LAB;  Service: Cardiovascular;  Laterality: N/A;   PACEMAKER IMPLANT N/A 04/24/2021   Procedure: PACEMAKER IMPLANT;  Surgeon: Lanier Prude, MD;  Location: [facility] INVASIVE CV LAB;  Service: Cardiovascular;  Laterality: N/A;   Ruptured disc     Sciatica    STENT PLACEMENT VASCULAR ([facility] HX)  2012   TEE WITHOUT CARDIOVERSION N/A 10/31/2020   Procedure: TRANSESOPHAGEAL ECHOCARDIOGRAM (TEE);  Surgeon: Linden Dolin, MD;  Location: [facility] OR;  Service: Open Heart Surgery;  Laterality: N/A;    Current Medications: Current Meds  Medication Sig   aspirin EC 81 MG tablet Take 1 tablet (81 mg total) by mouth in the morning. Swallow whole.   atorvastatin (LIPITOR) 80 MG tablet TAKE 1 TABLET BY MOUTH IN THE  MORNING   Cholecalciferol (VITAMIN D3) 125 MCG (5000 UT) CAPS Take 1 capsule (5,000 Units total) by mouth daily.   diphenhydramine-acetaminophen (TYLENOL PM) 25-500 MG TABS tablet Take 3 tablets by mouth at bedtime.   ezetimibe (ZETIA) 10 MG tablet TAKE 1 TABLET BY MOUTH IN  THE  MORNING   fenofibrate (TRICOR) 145 MG tablet TAKE 1 TABLET BY MOUTH IN THE  MORNING   furosemide (LASIX) 20 MG tablet Take 1 tablet (20 mg total) by mouth as needed. For ankle swelling, weight gain, shortness of breath   JARDIANCE 25 MG TABS tablet TAKE 1 TABLET BY MOUTH DAILY  BEFORE BREAKFAST   omeprazole (PRILOSEC) 20 MG capsule Take 20 mg by mouth in the morning.   OZEMPIC, 0.25 OR 0.5 MG/DOSE, 2 MG/3ML SOPN Inject 0.5 mg as directed once a week.   potassium chloride (KLOR-CON) 10 MEQ tablet Take 1 tablet (10 mEq total) by mouth as needed. Take with lasix   sacubitril-valsartan (ENTRESTO) 97-103 MG Take 1 tablet by mouth 2 (two) times daily.   sildenafil (REVATIO) 20 MG tablet TAKE 1 TO 5 TABLETS BY MOUTH ABOUT 30 MINS PRIOR TO SEX. START WITH 1 AND INCREASE AS NEEDED    Allergies:   Beta adrenergic blockers   Social History   Socioeconomic History   Marital status:  Married    Spouse name: Not on file   Number of children: Not on file   Years of education: Not on file   Highest education level: Not on file  Occupational History   Not on file  Tobacco Use   Smoking status: Former    Packs/day: 1.00    Years: 30.00    Total pack years: 30.00    Types: Cigarettes    Quit date: 04/18/1991    Years since quitting: 31.2   Smokeless tobacco: Former    Types: Chew    Quit date: 04/16/1993  Vaping Use   Vaping Use: Never used  Substance and Sexual Activity   Alcohol use: Yes    Comment: Occasionally    Drug use: Never   Sexual activity: Not on file  Other Topics Concern   Not on file  Social History Narrative   Not on file   Social Determinants of Health   Financial Resource Strain: Not on file  Food Insecurity: Not on file  Transportation Needs: Not on file  Physical Activity: Not on file  Stress: Not on file  Social Connections: Not on file     Family History:  The patient's family history includes Aortic aneurysm in his father; Arrhythmia in his mother; Heart disease in his brother; Hyperlipidemia in his sister; Hypertension in his sister; Pneumonia in his mother.  ROS:   12-point review of systems is negative unless otherwise noted in HPI.   EKGs/Labs/Other Studies Reviewed:    Studies reviewed were summarized above. The additional studies were reviewed today:  Lexiscan MPI 09/25/2020: Pharmacological myocardial perfusion imaging study with no significant ischemia Large perfusion defect in the mid to distal anteroseptal wall, mid to distal inferior wall and apical region Hypokinesis of the region detailed above,, EF estimated at 32% No EKG changes concerning for ischemia at peak stress or in recovery. CT attenuation correction images with moderate aortic atherosclerosis, three-vessel coronary calcification Moderate risk scan given depressed ejection fraction, large defect __________   2D echo 10/08/2020: 1. Left ventricular  ejection fraction, by estimation, is 25 to 30%. The  left ventricle has severely decreased function. The left ventricle  demonstrates global hypokinesis. There is mild left ventricular  hypertrophy. Left ventricular diastolic parameters   are indeterminate.   2. Right ventricular systolic function is mildly reduced. The right  ventricular size is normal.   3. The mitral valve is normal in structure. Mild mitral valve  regurgitation.  4. The aortic valve was not well visualized. Aortic valve regurgitation  is not visualized.  __________   LHC 10/20/2020: Conclusions: Severe three-vessel coronary artery disease, including long segment of diffuse mid LAD disease of 50-90% stenosis with moderate to severe associated calcification and occlusion of branch of D1, small LCx with chronic total occlusion of OM1, and large RCA with 70-90% tandem lesions involving rPDA and 60% rPL stenosis. Patient rPL stent with mild in-stent restenosis (~20%). Upper normal left ventricular filling pressure (LVEDP ~15 mmHg).   Recommendations: Outpatient cardiac surgery consultation for CABG, given three-vessel coronary artery disease, reduced LVEF, and diabetes mellitus. Continue current heart failure and antianginal regimen. Aggressive secondary prevention. __________   Pre-CABG vascular studies 10/29/2020: Summary:  Right Carotid: Velocities in the right ICA are consistent with a 40-59%                 stenosis.   Left Carotid: Velocities in the left ICA are consistent with a 1-39%  stenosis.  Vertebrals:  Bilateral vertebral arteries demonstrate antegrade flow. Left               vertebral artery demonstrates high resistant flow.  Subclavians: Normal flow hemodynamics were seen in bilateral subclavian               arteries.   Right ABI: Resting right ankle-brachial index is within normal range. No  evidence of significant right lower extremity arterial disease.  Left ABI: Resting left ankle-brachial  index is within normal range. No  evidence of significant left lower extremity arterial disease.  Right Upper Extremity: Doppler waveforms remain within normal limits with  right radial compression. Doppler waveform obliterate with right ulnar  compression.  Left Upper Extremity: Doppler waveform obliterate with left radial  compression. Doppler waveforms remain within normal limits with left ulnar  compression.  __________   Intraoperative TEE 10/31/2020: POST-OP IMPRESSIONS  limited Post-CPB examination: The patient separated easily from CPB.  - Left Ventricle: The left ventricle shows improvement in global  contractility.  There is some recovery of contractility in the inferior wall, although it  remains hypokinetic. The septal area remains akinetic. Visually, the EF  has  imroved to approximately 30%.  - Right Ventricle: The right ventricular function appears normal,  unchanged from  pre-bypass images.  - Aortic Valve: The aortic valve function appears normal and unchanged  from  pre-bypass images.  - Mitral Valve: The mitral valve function appears normal, and unchanged  from  pre-bypass images.  - Tricuspid Valve: The tricuspid valve function appears unchanged from  pre-bypass images.  __________   Luci Bank patch 11/2020: Patient had a min HR of 75 bpm, max HR of 193 bpm, and avg HR of 96 bpm.  Predominant underlying rhythm was Sinus Rhythm.  First Degree AV Block was present.  Bundle Branch Block/IVCD was present.    3 Supraventricular Tachycardia runs occurred, the run with the fastest interval lasting 13 beats with a max rate of 193 bpm, the longest lasting 19.3 secs with an avg rate of 117 bpm.    Isolated SVEs were rare (<1.0%), SVE Couplets were rare (<1.0%), and SVE Triplets were rare (<1.0%).  Isolated VEs were occasional  (1.6%, 30479), VE Couplets were rare (<1.0%, 3115), and VE Triplets were rare (<1.0%, 1).   One patient triggered event, not associated with  significant arrhytmia __________   Eugenie Birks MPI 03/13/2021:   Abnormal pharmacologic myocardial perfusion stress test.   There is a moderate in size, severe,  fixed inferior wall and apical lateral defect consistent with scar.   No significant ischemia is identified.   Left ventricular systolic function is mildly reduced (LVEF 45-50%) with inferior hypokinesis.   Attenuation correction CT demonstrates coronary artery calcification and aortic atherosclerosis as well as post CABG findings.   This is an intermediate risk study. __________   Luci Bank patch 02/2021: Normal sinus rhythm Patient had a min HR of 21 bpm, max HR of 85 bpm, and avg HR of 48 bpm.   Bundle Branch Block/IVCD was present.   62 Pauses occurred, the longest lasting 11.4 secs (5 bpm).  Some Pauses occurred due to Complete Heart Block and High Grade AV Block.    19147 episode(s) of AV Block (High Grade, 2nd Mobitz II and 3rd) occurred, lasting a total of 5 days 12 hours.    No Isolated SVEs, SVE Couplets, or SVE Triplets were present. Isolated VEs were rare (<1.0%), and no VE Couplets or VE Triplets were present.    Metoprolol succinate 50 mg daily held on March 24, 2021 when seen in clinic Will request telemetry strip on pause dated November 10 Suggest repeat ZIO live monitor for evaluation of arrhythmia off metoprolol __________   2D echo 04/17/2021: 1. Left ventricular ejection fraction, by estimation, is 45 to 50%. The  left ventricle has mildly decreased function. The left ventricle  demonstrates regional wall motion abnormalities (septal wall hypokinesis  consistent with conduction  abnormality/bundle branch block). Left ventricular diastolic parameters  are indeterminate.   2. Right ventricular systolic function is normal. The right ventricular  size is normal. There is mildly elevated pulmonary artery systolic  pressure. The estimated right ventricular systolic pressure is 36.8 mmHg.   3. Left atrial size  was mildly dilated.   4. The mitral valve is normal in structure. Mild mitral valve  regurgitation. No evidence of mitral stenosis.   5. The aortic valve is normal in structure. Aortic valve regurgitation is  not visualized. Aortic valve sclerosis is present, with no evidence of  aortic valve stenosis.   6. The inferior vena cava is normal in size with greater than 50%  respiratory variability, suggesting right atrial pressure of 3 mmHg.   7. Marked bradycardia, rate 34 bpm   EKG:  EKG is ordered today.  The EKG ordered today demonstrates A-sensed V-paced rhythm, 84 bpm  Recent Labs: 01/01/2022: ALT 15; BUN 18; Creatinine, Ser 1.21; Potassium 4.2; Sodium 139  Recent Lipid Panel    Component Value Date/Time   CHOL 122 01/01/2022 0715   TRIG 133 01/01/2022 0715   HDL 26 (L) 01/01/2022 0715   CHOLHDL 4.7 01/01/2022 0715   LDLCALC 72 01/01/2022 0715    PHYSICAL EXAM:    VS:  BP 132/70 (BP Location: Left Arm, Patient Position: Sitting, Cuff Size: Large)   Pulse 84   Ht 5' 10.5" (1.791 m)   Wt 207 lb 6.4 oz (94.1 kg)   SpO2 95%   BMI 29.34 kg/m   BMI: Body mass index is 29.34 kg/m.  Physical Exam Vitals reviewed.  Constitutional:      Appearance: He is well-developed.  HENT:     Head: Normocephalic and atraumatic.  Eyes:     General:        Right eye: No discharge.        Left eye: No discharge.  Neck:     Vascular: No JVD.  Cardiovascular:     Rate and Rhythm: Normal rate and regular rhythm.  Pulses:          Posterior tibial pulses are 2+ on the right side and 2+ on the left side.     Heart sounds: Normal heart sounds, S1 normal and S2 normal. Heart sounds not distant. No midsystolic click and no opening snap. No murmur heard.    No friction rub.  Pulmonary:     Effort: Pulmonary effort is normal. No respiratory distress.     Breath sounds: Normal breath sounds. No decreased breath sounds, wheezing or rales.  Chest:     Chest wall: No tenderness.  Abdominal:      General: There is no distension.  Musculoskeletal:     Cervical back: Normal range of motion.     Right lower leg: No edema.     Left lower leg: No edema.  Skin:    General: Skin is warm and dry.     Nails: There is no clubbing.  Neurological:     Mental Status: He is alert and oriented to person, place, and time.  Psychiatric:        Speech: Speech normal.        Behavior: Behavior normal.        Thought Content: Thought content normal.        Judgment: Judgment normal.     Wt Readings from Last 3 Encounters:  07/06/22 207 lb 6.4 oz (94.1 kg)  01/27/22 206 lb (93.4 kg)  12/25/21 208 lb (94.3 kg)     ASSESSMENT & PLAN:   CAD status post CABG without angina: He continues to do very well and is without symptoms of angina.  Continue aggressive risk factor modification and secondary prevention including aspirin, atorvastatin, and ezetimibe.  He remains very active at baseline.  No indication for further ischemic testing at this time.  HFrEF secondary to ICM: He is euvolemic and well compensated with NYHA class I symptoms.  Echo in 04/2021 demonstrated an improved LV systolic function with an EF of 45 to 50%.  He has not been maintained on beta-blocker given prior history of complete heart block (now status post PPM) with primary cardiologist deferring beta-blocker initiation to EP.  He does remain on Entresto 97/103 mg twice daily along with Jardiance.  He has not needed any as needed furosemide.  Initially, our plans for today were to add spironolactone with follow-up labs, however given his impending move to [place], [place] in 2 weeks this was deferred.  I have advised him to establish with general cardiology and electrophysiology upon arriving to [place], [place] for ongoing cardiac care as well as escalation of GDMT as tolerated.  He would benefit from a repeat echo on maximally tolerated evidence-based medical therapy to reevaluate LV systolic function.  Complete heart  block: Status post PPM.  He will need to establish care with EP in [place].  HTN: Blood pressure is well-controlled in the office today.  Continue medical therapy as outlined above.  HLD: LDL 72 in 12/2021.  He remains on atorvastatin and now ezetimibe.  Carotid artery stenosis: Pre-CABG ultrasound showed 40 to 59% RICA stenosis with no LICA stenosis.  He remains on aspirin and atorvastatin.  Will need to follow-up carotid artery ultrasound in [place].    Disposition: Patient is relocating to [place] in 2 weeks.  He was advised to establish with general cardiology and electrophysiology in his new town.   Medication Adjustments/Labs and Tests Ordered: Current medicines are reviewed at length with the patient today.  Concerns regarding medicines are  outlined above. Medication changes, Labs and Tests ordered today are summarized above and listed in the Patient Instructions accessible in Encounters.   Signed, Eula Listen, PA-C 07/06/2022 5:28 PM     [facility] [place] [address] [place], [place] [address] [phone]

## 2022-07-06 ENCOUNTER — Ambulatory Visit: Payer: Managed Care, Other (non HMO) | Attending: Physician Assistant | Admitting: Physician Assistant

## 2022-07-06 ENCOUNTER — Encounter: Payer: Self-pay | Admitting: Physician Assistant

## 2022-07-06 VITALS — BP 132/70 | HR 84 | Ht 70.5 in | Wt 207.4 lb

## 2022-07-06 DIAGNOSIS — I2510 Atherosclerotic heart disease of native coronary artery without angina pectoris: Secondary | ICD-10-CM | POA: Diagnosis not present

## 2022-07-06 DIAGNOSIS — I5020 Unspecified systolic (congestive) heart failure: Secondary | ICD-10-CM | POA: Diagnosis not present

## 2022-07-06 DIAGNOSIS — I442 Atrioventricular block, complete: Secondary | ICD-10-CM

## 2022-07-06 DIAGNOSIS — Z950 Presence of cardiac pacemaker: Secondary | ICD-10-CM

## 2022-07-06 DIAGNOSIS — Z951 Presence of aortocoronary bypass graft: Secondary | ICD-10-CM

## 2022-07-06 DIAGNOSIS — I255 Ischemic cardiomyopathy: Secondary | ICD-10-CM | POA: Diagnosis not present

## 2022-07-06 DIAGNOSIS — E785 Hyperlipidemia, unspecified: Secondary | ICD-10-CM

## 2022-07-06 DIAGNOSIS — I10 Essential (primary) hypertension: Secondary | ICD-10-CM

## 2022-07-06 DIAGNOSIS — I6521 Occlusion and stenosis of right carotid artery: Secondary | ICD-10-CM

## 2022-07-06 NOTE — Patient Instructions (Signed)
Medication Instructions:  No changes at this time.   *If you need a refill on your cardiac medications before your next appointment, please call your pharmacy*   Lab Work: None  If you have labs (blood work) drawn today and your tests are completely normal, you will receive your results only by: [place] (if you have MyChart) OR A paper copy in the mail If you have any lab test that is abnormal or we need to change your treatment, we will call you to review the results.   Testing/Procedures: None   Follow-Up: At [facility], you and your health needs are our priority.  As part of our continuing mission to provide you with exceptional heart care, we have created designated Provider Care Teams.  These Care Teams include your primary Cardiologist (physician) and Advanced Practice Providers (APPs -  Physician Assistants and Nurse Practitioners) who all work together to provide you with the care you need, when you need it.   Your next appointment:   As needed

## 2022-07-20 NOTE — Progress Notes (Unsigned)
  Electrophysiology Office Follow up Visit Note:    Date:  07/21/2022   ID:  Ronald Roth, DOB [birth year], MRN [id number]  PCP:  Olin Hauser, DO  [place] Cardiologist:  [name], MD  [facility] Electrophysiologist:  Vickie Epley, MD    Interval History:    Ronald Roth is a 72 y.o. male who presents for a follow up visit.   Last seen 07/2021 in follow up for PPM. Has a hx of CHB.  He has been doing very well.  No problems with his pacemaker.  He is moving to [place] today actually to be closer to his youngest daughter who is getting her PhD at [employer].     Past medical, surgical, social and family history were reviewed.  ROS:   Please see the history of present illness.    All other systems reviewed and are negative.  EKGs/Labs/Other Studies Reviewed:    The following studies were reviewed today:  July 21, 2022 in clinic device interrogation personally reviewed Battery longevity and lead parameters are all stable.  Proper device function. V paces 98% NSVT, max seconds at 174 bpm Dependent at VVI 30    Physical Exam:    VS:  BP (!) 148/86   Pulse 95   Ht 5' 10.5" (1.791 m)   Wt 204 lb (92.5 kg)   SpO2 96%   BMI 28.86 kg/m     Wt Readings from Last 3 Encounters:  07/21/22 204 lb (92.5 kg)  07/06/22 207 lb 6.4 oz (94.1 kg)  01/27/22 206 lb (93.4 kg)     GEN:  Well nourished, well developed in no acute distress CARDIAC: RRR, no murmurs, rubs, gallops.  Pacemaker pocket well-healed RESPIRATORY:  Clear to auscultation without rales, wheezing or rhonchi       ASSESSMENT:    1. Coronary artery disease involving native coronary artery of native heart without angina pectoris   2. Heart block AV complete ([place])   3. S/P CABG x 4   4. Cardiac pacemaker in situ    PLAN:    In order of problems listed above:   #CHB #PPM in situ Device functioning appropriately. Continue remote monitoring. He is planning to establish  with a local cardiologist and electrophysiologist when he moves to [place].  We will continue to monitor until he gets established with a new electrophysiologist.  He is to call with any concerns or problems with his pacemaker between now and his appointment with the new EP.  #CAD s/p CABG No ischemic symtpoms. Continue current medical therapy.  #Hypertension Above goal today.  Recommend checking blood pressures 1-2 times per week at home and recording the values.  Recommend bringing these recordings to the primary care physician.   Follow-up with [place] at [facility] on an as-needed basis.   Signed, Lars Mage, MD, [facility], [facility] 07/21/2022 11:05 AM    Electrophysiology [facility]

## 2022-07-21 ENCOUNTER — Encounter: Payer: Self-pay | Admitting: Cardiology

## 2022-07-21 ENCOUNTER — Ambulatory Visit: Payer: Managed Care, Other (non HMO) | Attending: Cardiology | Admitting: Cardiology

## 2022-07-21 VITALS — BP 148/86 | HR 95 | Ht 70.5 in | Wt 204.0 lb

## 2022-07-21 DIAGNOSIS — Z950 Presence of cardiac pacemaker: Secondary | ICD-10-CM | POA: Diagnosis not present

## 2022-07-21 DIAGNOSIS — Z951 Presence of aortocoronary bypass graft: Secondary | ICD-10-CM

## 2022-07-21 DIAGNOSIS — I2510 Atherosclerotic heart disease of native coronary artery without angina pectoris: Secondary | ICD-10-CM

## 2022-07-21 DIAGNOSIS — I442 Atrioventricular block, complete: Secondary | ICD-10-CM

## 2022-07-21 LAB — PACEMAKER DEVICE OBSERVATION

## 2022-07-21 LAB — CUP PACEART INCLINIC DEVICE CHECK
Date Time Interrogation Session: 20240306110539
Implantable Lead Connection Status: 753985
Implantable Lead Connection Status: 753985
Implantable Lead Implant Date: 20221209
Implantable Lead Implant Date: 20221209
Implantable Lead Location: 753859
Implantable Lead Location: 753860
Implantable Lead Model: 7841
Implantable Lead Model: 7842
Implantable Lead Serial Number: 1100565
Implantable Lead Serial Number: 1189145
Implantable Pulse Generator Implant Date: 20221209
Pulse Gen Serial Number: 549513

## 2022-07-21 NOTE — Patient Instructions (Signed)
Medication Instructions:  Your physician recommends that you continue on your current medications as directed. Please refer to the Current Medication list given to you today.  *If you need a refill on your cardiac medications before your next appointment, please call your pharmacy*  Follow-Up: At [facility], you and your health needs are our priority.  As part of our continuing mission to provide you with exceptional heart care, we have created designated Provider Care Teams.  These Care Teams include your primary Cardiologist (physician) and Advanced Practice Providers (APPs -  Physician Assistants and Nurse Practitioners) who all work together to provide you with the care you need, when you need it.  Your next appointment:   None - call us if you need anything prior to moving

## 2022-07-22 ENCOUNTER — Encounter: Payer: Self-pay | Admitting: Cardiology

## 2022-07-26 ENCOUNTER — Ambulatory Visit (INDEPENDENT_AMBULATORY_CARE_PROVIDER_SITE_OTHER): Payer: Managed Care, Other (non HMO)

## 2022-07-26 DIAGNOSIS — I442 Atrioventricular block, complete: Secondary | ICD-10-CM | POA: Diagnosis not present

## 2022-07-28 LAB — CUP PACEART REMOTE DEVICE CHECK
Battery Remaining Longevity: 132 mo
Battery Remaining Percentage: 100 %
Brady Statistic RA Percent Paced: 0 %
Brady Statistic RV Percent Paced: 100 %
Date Time Interrogation Session: 20240311041100
Implantable Lead Connection Status: 753985
Implantable Lead Connection Status: 753985
Implantable Lead Implant Date: 20221209
Implantable Lead Implant Date: 20221209
Implantable Lead Location: 753859
Implantable Lead Location: 753860
Implantable Lead Model: 7841
Implantable Lead Model: 7842
Implantable Lead Serial Number: 1100565
Implantable Lead Serial Number: 1189145
Implantable Pulse Generator Implant Date: 20221209
Lead Channel Impedance Value: 616 Ohm
Lead Channel Impedance Value: 676 Ohm
Lead Channel Pacing Threshold Amplitude: 0.5 V
Lead Channel Pacing Threshold Amplitude: 0.8 V
Lead Channel Pacing Threshold Pulse Width: 0.4 ms
Lead Channel Pacing Threshold Pulse Width: 0.4 ms
Lead Channel Setting Pacing Amplitude: 1.2 V
Lead Channel Setting Pacing Amplitude: 2 V
Lead Channel Setting Pacing Pulse Width: 0.4 ms
Lead Channel Setting Sensing Sensitivity: 3 mV
Pulse Gen Serial Number: 549513
Zone Setting Status: 755011

## 2022-08-21 ENCOUNTER — Other Ambulatory Visit: Payer: Self-pay | Admitting: Cardiovascular Disease

## 2022-09-06 NOTE — Progress Notes (Signed)
Remote pacemaker transmission.   

## 2022-10-25 ENCOUNTER — Ambulatory Visit (INDEPENDENT_AMBULATORY_CARE_PROVIDER_SITE_OTHER): Payer: Managed Care, Other (non HMO)

## 2022-10-25 DIAGNOSIS — I442 Atrioventricular block, complete: Secondary | ICD-10-CM

## 2022-10-26 LAB — CUP PACEART REMOTE DEVICE CHECK
Battery Remaining Longevity: 150 mo
Battery Remaining Percentage: 100 %
Brady Statistic RA Percent Paced: 0 %
Brady Statistic RV Percent Paced: 100 %
Date Time Interrogation Session: 20240611134400
Implantable Lead Connection Status: 753985
Implantable Lead Connection Status: 753985
Implantable Lead Implant Date: 20221209
Implantable Lead Implant Date: 20221209
Implantable Lead Location: 753859
Implantable Lead Location: 753860
Implantable Lead Model: 7841
Implantable Lead Model: 7842
Implantable Lead Serial Number: 1100565
Implantable Lead Serial Number: 1189145
Implantable Pulse Generator Implant Date: 20221209
Lead Channel Impedance Value: 620 Ohm
Lead Channel Impedance Value: 682 Ohm
Lead Channel Pacing Threshold Amplitude: 0.5 V
Lead Channel Pacing Threshold Amplitude: 0.8 V
Lead Channel Pacing Threshold Pulse Width: 0.4 ms
Lead Channel Pacing Threshold Pulse Width: 0.4 ms
Lead Channel Setting Pacing Amplitude: 1.3 V
Lead Channel Setting Pacing Amplitude: 2 V
Lead Channel Setting Pacing Pulse Width: 0.4 ms
Lead Channel Setting Sensing Sensitivity: 3 mV
Pulse Gen Serial Number: 549513
Zone Setting Status: 755011

## 2022-11-04 ENCOUNTER — Other Ambulatory Visit: Payer: Self-pay | Admitting: Cardiovascular Disease

## 2022-11-16 NOTE — Progress Notes (Signed)
Remote pacemaker transmission.   

## 2022-12-22 ENCOUNTER — Telehealth: Payer: Self-pay

## 2022-12-22 NOTE — Telephone Encounter (Signed)
Copied from CRM [id number]. Topic: General - Inquiry >> Dec 22, 2022  1:38 PM De Blanch wrote: Reason for CRM: [facility] Requesting a list of the most recent active medications. Mentioned that pt is now a new pt to them due to he moved. Pt requested a refill on ENTRESTO he said dose was wrong and they would like to verify if its an active medication.  [facility] -[place], [place] [address]  Please advise.

## 2022-12-22 NOTE — Telephone Encounter (Signed)
I advised Lorelle Formosa from [facility] of pt's [employer].    Thanks,   -Vernona Rieger

## 2023-01-23 ENCOUNTER — Other Ambulatory Visit: Payer: Self-pay | Admitting: Family Medicine

## 2023-01-23 DIAGNOSIS — E1140 Type 2 diabetes mellitus with diabetic neuropathy, unspecified: Secondary | ICD-10-CM

## 2023-01-25 NOTE — Telephone Encounter (Signed)
Requested medication (s) are due for refill today: yes  Requested medication (s) are on the active medication list: yes  Last refill:  03/24/22  Future visit scheduled: no  Notes to clinic:  Unable to refill per protocol due to failed labs, no updated A1c results.      Requested Prescriptions  Pending Prescriptions Disp Refills   OZEMPIC, 0.25 OR 0.5 MG/DOSE, 2 MG/3ML SOPN [Pharmacy Med Name: OZEMPIC  0.25MG  0.5MG  SOLUTION PEN-INJECTOR  PEN DOSE] 9 mL 3    Sig: INJECT SUBCUTANEOUSLY 0.5 MG  EVERY WEEK AS DIRECTED     Endocrinology:  Diabetes - GLP-1 Receptor Agonists - semaglutide Failed - 01/23/2023 10:48 PM      Failed - HBA1C in normal range and within 180 days    Hemoglobin A1C  Date Value Ref Range Status  03/30/2021 7.1 (A) 4.0 - 5.6 % Final  01/29/2020 7.3  Final    Comment:    [facility]   Hgb A1c MFr Bld  Date Value Ref Range Status  11/19/2020 7.3 (H) 4.8 - 5.6 % Final    Comment:             Prediabetes: 5.7 - 6.4          Diabetes: >6.4          Glycemic control for adults with diabetes: <7.0          Failed - Cr in normal range and within 360 days    Creatinine, Ser  Date Value Ref Range Status  01/01/2022 1.21 0.76 - 1.27 mg/dL Final         Failed - Valid encounter within last 6 months    Recent Outpatient Visits           12 months ago Erectile dysfunction, unspecified erectile dysfunction type   [place] [facility] Smitty Cords, DO   1 year ago Acute cystitis with hematuria   [place] [facility] Smitty Cords, DO   1 year ago Type 2 diabetes, controlled, with neuropathy [facility])   [place] [facility] Smitty Cords, DO   2 years ago Type 2 diabetes, controlled, with neuropathy [facility])   [place] [facility] Smitty Cords, DO   2 years ago Type 2 diabetes, controlled, with neuropathy [facility])    [facility] [place], Netta Neat, [place]

## 2023-04-12 ENCOUNTER — Other Ambulatory Visit: Payer: Self-pay | Admitting: Family Medicine

## 2023-04-12 DIAGNOSIS — E1169 Type 2 diabetes mellitus with other specified complication: Secondary | ICD-10-CM

## 2023-04-12 NOTE — Telephone Encounter (Signed)
Requested medications are due for refill today.  yes  Requested medications are on the active medications list.  yes  Last refill. 04/29/2022 #90 3 rf  Future visit scheduled.   no  Notes to clinic.  Pt last seen 01/27/2022. Np pcp listed - pt recently seen at a [employer].    Requested Prescriptions  Pending Prescriptions Disp Refills   atorvastatin (LIPITOR) 80 MG tablet [Pharmacy Med Name: Atorvastatin Calcium 80 MG Oral Tablet] 90 tablet 3    Sig: TAKE 1 TABLET BY MOUTH IN THE  MORNING     Cardiovascular:  Antilipid - Statins Failed - 04/12/2023  1:11 AM      Failed - Valid encounter within last 12 months    Recent Outpatient Visits           1 year ago Erectile dysfunction, unspecified erectile dysfunction type   [place] [facility] Smitty Cords, DO   1 year ago Acute cystitis with hematuria   [place] [facility] [place], Netta Neat, DO   2 years ago Type 2 diabetes, controlled, with neuropathy [facility])   [place] [facility] [place], Netta Neat, DO   2 years ago Type 2 diabetes, controlled, with neuropathy [facility])   [place] [facility] [place], Netta Neat, DO   2 years ago Type 2 diabetes, controlled, with neuropathy [facility])    [facility] [place], Netta Neat, DO              Failed - Lipid Panel in normal range within the last 12 months    Cholesterol, Total  Date Value Ref Range Status  01/01/2022 122 100 - 199 mg/dL Final   LDL Chol Calc (NIH)  Date Value Ref Range Status  01/01/2022 72 0 - 99 mg/dL Final   HDL  Date Value Ref Range Status  01/01/2022 26 (L) >39 mg/dL Final   Triglycerides  Date Value Ref Range Status  01/01/2022 133 0 - 149 mg/dL Final         Passed - Patient is not pregnant

## 2023-04-13 NOTE — Telephone Encounter (Signed)
Patient has relocated to [place]. He is followed by different health system now.  Will decline the refill  Saralyn Pilar, DO [facility] 04/13/2023, 5:18 PM

## 2023-04-13 NOTE — Telephone Encounter (Signed)
I believe this is your patient

## 2023-04-18 ENCOUNTER — Other Ambulatory Visit: Payer: Self-pay | Admitting: Family Medicine

## 2023-04-18 DIAGNOSIS — E1169 Type 2 diabetes mellitus with other specified complication: Secondary | ICD-10-CM

## 2023-04-21 NOTE — Telephone Encounter (Signed)
Pt no longer a pt under this prescriber  Requested Prescriptions  Refused Prescriptions Disp Refills   fenofibrate (TRICOR) 145 MG tablet [Pharmacy Med Name: Fenofibrate 145 MG Oral Tablet] 90 tablet 3    Sig: TAKE 1 TABLET BY MOUTH IN THE  MORNING     Cardiovascular:  Antilipid - Fibric Acid Derivatives Failed - 04/18/2023 10:55 PM      Failed - ALT in normal range and within 360 days    ALT  Date Value Ref Range Status  01/01/2022 15 0 - 44 IU/L Final         Failed - AST in normal range and within 360 days    AST  Date Value Ref Range Status  01/01/2022 13 0 - 40 IU/L Final         Failed - Cr in normal range and within 360 days    Creatinine, Ser  Date Value Ref Range Status  01/01/2022 1.21 0.76 - 1.27 mg/dL Final         Failed - HGB in normal range and within 360 days    Hemoglobin  Date Value Ref Range Status  04/15/2021 15.0 13.0 - 17.7 g/dL Final         Failed - HCT in normal range and within 360 days    Hematocrit  Date Value Ref Range Status  04/15/2021 44.2 37.5 - 51.0 % Final         Failed - PLT in normal range and within 360 days    Platelets  Date Value Ref Range Status  04/15/2021 240 150 - 450 x10E3/uL Final         Failed - WBC in normal range and within 360 days    WBC  Date Value Ref Range Status  04/15/2021 7.9 3.4 - 10.8 x10E3/uL Final  03/06/2021 10.0 4.0 - 10.5 K/uL Final         Failed - eGFR is 30 or above and within 360 days    GFR, Estimated  Date Value Ref Range Status  03/06/2021 >60 >60 mL/min Final    Comment:    (NOTE) Calculated using the CKD-EPI Creatinine Equation (2021)    eGFR  Date Value Ref Range Status  01/01/2022 64 >59 mL/min/1.73 Final         Failed - Valid encounter within last 12 months    Recent Outpatient Visits           1 year ago Erectile dysfunction, unspecified erectile dysfunction type   [place] [facility] Smitty Cords, DO   1 year ago Acute cystitis with  hematuria   [place] [facility] Smitty Cords, DO   2 years ago Type 2 diabetes, controlled, with neuropathy [facility])   [place] [facility] Smitty Cords, DO   2 years ago Type 2 diabetes, controlled, with neuropathy [facility])   [place] [facility] Smitty Cords, DO   2 years ago Type 2 diabetes, controlled, with neuropathy [facility])   [place] [facility] Smitty Cords, DO              Failed - Lipid Panel in normal range within the last 12 months    Cholesterol, Total  Date Value Ref Range Status  01/01/2022 122 100 - 199 mg/dL Final   LDL Chol Calc (NIH)  Date Value Ref Range Status  01/01/2022 72 0 - 99 mg/dL Final  HDL  Date Value Ref Range Status  01/01/2022 26 (L) >39 mg/dL Final   Triglycerides  Date Value Ref Range Status  01/01/2022 133 0 - 149 mg/dL Final

## 2023-04-24 ENCOUNTER — Other Ambulatory Visit: Payer: Self-pay | Admitting: Family Medicine

## 2023-04-24 DIAGNOSIS — E1140 Type 2 diabetes mellitus with diabetic neuropathy, unspecified: Secondary | ICD-10-CM

## 2023-04-26 NOTE — Telephone Encounter (Signed)
Patient no longer under prescriber's care. Requested Prescriptions  Pending Prescriptions Disp Refills   JARDIANCE 25 MG TABS tablet [Pharmacy Med Name: Jardiance 25 MG Oral Tablet] 90 tablet 3    Sig: TAKE 1 TABLET BY MOUTH DAILY  BEFORE BREAKFAST     Endocrinology:  Diabetes - SGLT2 Inhibitors Failed - 04/24/2023 10:18 PM      Failed - Cr in normal range and within 360 days    Creatinine, Ser  Date Value Ref Range Status  01/01/2022 1.21 0.76 - 1.27 mg/dL Final         Failed - HBA1C is between 0 and 7.9 and within 180 days    Hemoglobin A1C  Date Value Ref Range Status  03/30/2021 7.1 (A) 4.0 - 5.6 % Final  01/29/2020 7.3  Final    Comment:    [facility]   Hgb A1c MFr Bld  Date Value Ref Range Status  11/19/2020 7.3 (H) 4.8 - 5.6 % Final    Comment:             Prediabetes: 5.7 - 6.4          Diabetes: >6.4          Glycemic control for adults with diabetes: <7.0          Failed - eGFR in normal range and within 360 days    GFR, Estimated  Date Value Ref Range Status  03/06/2021 >60 >60 mL/min Final    Comment:    (NOTE) Calculated using the CKD-EPI Creatinine Equation (2021)    eGFR  Date Value Ref Range Status  01/01/2022 64 >59 mL/min/1.73 Final         Failed - Valid encounter within last 6 months    Recent Outpatient Visits           1 year ago Erectile dysfunction, unspecified erectile dysfunction type   [place] [facility] Smitty Cords, DO   1 year ago Acute cystitis with hematuria   [place] [facility] Smitty Cords, DO   2 years ago Type 2 diabetes, controlled, with neuropathy [facility])   [place] [facility] Smitty Cords, DO   2 years ago Type 2 diabetes, controlled, with neuropathy [facility])   [place] [facility] Smitty Cords, DO   2 years ago Type 2 diabetes, controlled, with neuropathy [facility])   [place]  [facility] [place], Netta Neat, [place]

## 2023-10-11 ENCOUNTER — Other Ambulatory Visit: Payer: Self-pay | Admitting: Cardiovascular Disease
